# Patient Record
Sex: Male | Born: 1935 | Race: White | Hispanic: No | Marital: Married | Smoking: Never smoker
Health system: Southern US, Community
[De-identification: ages and names within clinical notes are randomized; demographics above are authoritative.]

## PROBLEM LIST (undated history)

## (undated) DIAGNOSIS — I2510 Atherosclerotic heart disease of native coronary artery without angina pectoris: Secondary | ICD-10-CM

## (undated) DIAGNOSIS — G629 Polyneuropathy, unspecified: Secondary | ICD-10-CM

## (undated) DIAGNOSIS — I639 Cerebral infarction, unspecified: Secondary | ICD-10-CM

## (undated) DIAGNOSIS — I4891 Unspecified atrial fibrillation: Secondary | ICD-10-CM

## (undated) DIAGNOSIS — I10 Essential (primary) hypertension: Secondary | ICD-10-CM

## (undated) DIAGNOSIS — K635 Polyp of colon: Secondary | ICD-10-CM

## (undated) DIAGNOSIS — K225 Diverticulum of esophagus, acquired: Secondary | ICD-10-CM

## (undated) DIAGNOSIS — H9190 Unspecified hearing loss, unspecified ear: Secondary | ICD-10-CM

## (undated) DIAGNOSIS — K922 Gastrointestinal hemorrhage, unspecified: Secondary | ICD-10-CM

## (undated) DIAGNOSIS — K625 Hemorrhage of anus and rectum: Secondary | ICD-10-CM

## (undated) DIAGNOSIS — I6529 Occlusion and stenosis of unspecified carotid artery: Secondary | ICD-10-CM

## (undated) DIAGNOSIS — K226 Gastro-esophageal laceration-hemorrhage syndrome: Secondary | ICD-10-CM

## (undated) HISTORY — DX: Polyp of colon: K63.5

## (undated) HISTORY — PX: ABDOMINAL SURGERY: SHX537

## (undated) HISTORY — PX: BACK SURGERY: SHX140

## (undated) HISTORY — DX: Gastro-esophageal laceration-hemorrhage syndrome: K22.6

## (undated) HISTORY — PX: HERNIA REPAIR: SHX51

## (undated) HISTORY — PX: APPENDECTOMY: SHX54

## (undated) HISTORY — DX: Unspecified hearing loss, unspecified ear: H91.90

## (undated) HISTORY — DX: Gastrointestinal hemorrhage, unspecified: K92.2

## (undated) HISTORY — DX: Diverticulum of esophagus, acquired: K22.5

## (undated) HISTORY — DX: Polyneuropathy, unspecified: G62.9

## (undated) HISTORY — DX: Unspecified atrial fibrillation: I48.91

## (undated) HISTORY — DX: Atherosclerotic heart disease of native coronary artery without angina pectoris: I25.10

## (undated) HISTORY — DX: Occlusion and stenosis of unspecified carotid artery: I65.29

---

## 1990-12-11 ENCOUNTER — Encounter: Payer: Self-pay | Admitting: Internal Medicine

## 1997-10-25 HISTORY — PX: CHOLECYSTECTOMY: SHX55

## 1998-01-01 ENCOUNTER — Encounter: Payer: Self-pay | Admitting: Internal Medicine

## 1999-01-07 ENCOUNTER — Encounter: Payer: Self-pay | Admitting: Family Medicine

## 1999-01-07 ENCOUNTER — Ambulatory Visit (HOSPITAL_COMMUNITY): Admission: RE | Admit: 1999-01-07 | Discharge: 1999-01-07 | Payer: Self-pay | Admitting: Family Medicine

## 1999-08-17 ENCOUNTER — Encounter: Payer: Self-pay | Admitting: Family Medicine

## 1999-08-17 ENCOUNTER — Encounter: Admission: RE | Admit: 1999-08-17 | Discharge: 1999-08-17 | Payer: Self-pay | Admitting: Family Medicine

## 1999-09-03 ENCOUNTER — Ambulatory Visit (HOSPITAL_COMMUNITY): Admission: RE | Admit: 1999-09-03 | Discharge: 1999-09-03 | Payer: Self-pay | Admitting: Neurosurgery

## 1999-09-03 ENCOUNTER — Encounter: Payer: Self-pay | Admitting: Neurosurgery

## 2000-09-01 ENCOUNTER — Ambulatory Visit (HOSPITAL_COMMUNITY): Admission: RE | Admit: 2000-09-01 | Discharge: 2000-09-01 | Payer: Self-pay | Admitting: Family Medicine

## 2000-09-01 ENCOUNTER — Encounter: Payer: Self-pay | Admitting: Family Medicine

## 2001-08-04 ENCOUNTER — Ambulatory Visit (HOSPITAL_COMMUNITY): Admission: RE | Admit: 2001-08-04 | Discharge: 2001-08-04 | Payer: Self-pay | Admitting: Family Medicine

## 2001-08-17 ENCOUNTER — Ambulatory Visit (HOSPITAL_COMMUNITY): Admission: RE | Admit: 2001-08-17 | Discharge: 2001-08-17 | Payer: Self-pay | Admitting: Family Medicine

## 2001-08-17 ENCOUNTER — Encounter: Payer: Self-pay | Admitting: Family Medicine

## 2003-03-01 ENCOUNTER — Ambulatory Visit: Admission: RE | Admit: 2003-03-01 | Discharge: 2003-03-01 | Payer: Self-pay | Admitting: Family Medicine

## 2003-10-09 ENCOUNTER — Emergency Department (HOSPITAL_COMMUNITY): Admission: EM | Admit: 2003-10-09 | Discharge: 2003-10-09 | Payer: Self-pay | Admitting: Emergency Medicine

## 2003-12-11 ENCOUNTER — Encounter: Payer: Self-pay | Admitting: Cardiovascular Disease

## 2007-03-09 ENCOUNTER — Ambulatory Visit: Payer: Self-pay | Admitting: *Deleted

## 2008-02-29 ENCOUNTER — Ambulatory Visit: Payer: Self-pay | Admitting: *Deleted

## 2008-09-12 ENCOUNTER — Ambulatory Visit: Payer: Self-pay | Admitting: *Deleted

## 2008-11-14 DIAGNOSIS — K921 Melena: Secondary | ICD-10-CM

## 2008-11-14 DIAGNOSIS — Z860100 Personal history of colon polyps, unspecified: Secondary | ICD-10-CM | POA: Insufficient documentation

## 2008-11-14 DIAGNOSIS — Z8719 Personal history of other diseases of the digestive system: Secondary | ICD-10-CM

## 2008-11-14 DIAGNOSIS — I10 Essential (primary) hypertension: Secondary | ICD-10-CM

## 2008-11-14 DIAGNOSIS — H9190 Unspecified hearing loss, unspecified ear: Secondary | ICD-10-CM | POA: Insufficient documentation

## 2008-11-14 DIAGNOSIS — G609 Hereditary and idiopathic neuropathy, unspecified: Secondary | ICD-10-CM | POA: Insufficient documentation

## 2008-11-20 ENCOUNTER — Ambulatory Visit: Payer: Self-pay | Admitting: Internal Medicine

## 2008-11-20 DIAGNOSIS — I2510 Atherosclerotic heart disease of native coronary artery without angina pectoris: Secondary | ICD-10-CM | POA: Insufficient documentation

## 2008-11-20 LAB — CONVERTED CEMR LAB
Basophils Absolute: 0 10*3/uL (ref 0.0–0.1)
Eosinophils Absolute: 0.2 10*3/uL (ref 0.0–0.7)
HCT: 39.8 % (ref 39.0–52.0)
Iron: 58 ug/dL (ref 42–165)
Neutrophils Relative %: 56.4 % (ref 43.0–77.0)
Platelets: 203 10*3/uL (ref 150–400)
RBC: 4.49 M/uL (ref 4.22–5.81)
Saturation Ratios: 20.9 % (ref 20.0–50.0)
WBC: 6.2 10*3/uL (ref 4.5–10.5)

## 2008-11-26 ENCOUNTER — Ambulatory Visit: Payer: Self-pay | Admitting: Internal Medicine

## 2009-01-15 ENCOUNTER — Encounter: Admission: RE | Admit: 2009-01-15 | Discharge: 2009-01-15 | Payer: Self-pay | Admitting: Family Medicine

## 2009-03-07 ENCOUNTER — Emergency Department (HOSPITAL_COMMUNITY): Admission: EM | Admit: 2009-03-07 | Discharge: 2009-03-07 | Payer: Self-pay | Admitting: Emergency Medicine

## 2009-03-19 ENCOUNTER — Ambulatory Visit: Payer: Self-pay | Admitting: *Deleted

## 2009-08-10 ENCOUNTER — Encounter: Payer: Self-pay | Admitting: Gastroenterology

## 2009-08-10 ENCOUNTER — Inpatient Hospital Stay (HOSPITAL_COMMUNITY): Admission: EM | Admit: 2009-08-10 | Discharge: 2009-08-14 | Payer: Self-pay | Admitting: Emergency Medicine

## 2009-08-12 ENCOUNTER — Ambulatory Visit: Payer: Self-pay | Admitting: Gastroenterology

## 2009-08-14 ENCOUNTER — Encounter (INDEPENDENT_AMBULATORY_CARE_PROVIDER_SITE_OTHER): Payer: Self-pay | Admitting: *Deleted

## 2009-08-19 ENCOUNTER — Encounter: Payer: Self-pay | Admitting: Internal Medicine

## 2009-08-22 ENCOUNTER — Encounter: Payer: Self-pay | Admitting: Gastroenterology

## 2009-08-22 ENCOUNTER — Encounter (INDEPENDENT_AMBULATORY_CARE_PROVIDER_SITE_OTHER): Payer: Self-pay | Admitting: Internal Medicine

## 2009-08-22 ENCOUNTER — Inpatient Hospital Stay (HOSPITAL_COMMUNITY): Admission: EM | Admit: 2009-08-22 | Discharge: 2009-08-24 | Payer: Self-pay | Admitting: Emergency Medicine

## 2009-08-22 ENCOUNTER — Encounter (INDEPENDENT_AMBULATORY_CARE_PROVIDER_SITE_OTHER): Payer: Self-pay | Admitting: *Deleted

## 2009-08-23 ENCOUNTER — Encounter (INDEPENDENT_AMBULATORY_CARE_PROVIDER_SITE_OTHER): Payer: Self-pay | Admitting: *Deleted

## 2009-08-25 ENCOUNTER — Telehealth: Payer: Self-pay | Admitting: Internal Medicine

## 2009-08-26 ENCOUNTER — Ambulatory Visit: Payer: Self-pay | Admitting: Internal Medicine

## 2009-08-26 DIAGNOSIS — K922 Gastrointestinal hemorrhage, unspecified: Secondary | ICD-10-CM | POA: Insufficient documentation

## 2009-08-26 LAB — CONVERTED CEMR LAB
Basophils Absolute: 0 10*3/uL (ref 0.0–0.1)
Basophils Relative: 0.8 % (ref 0.0–3.0)
Eosinophils Absolute: 0.2 10*3/uL (ref 0.0–0.7)
Eosinophils Relative: 2.8 % (ref 0.0–5.0)
HCT: 32.6 % — ABNORMAL LOW (ref 39.0–52.0)
Hemoglobin: 11.4 g/dL — ABNORMAL LOW (ref 13.0–17.0)
Lymphocytes Relative: 24.5 % (ref 12.0–46.0)
Lymphs Abs: 1.5 10*3/uL (ref 0.7–4.0)
MCHC: 35.1 g/dL (ref 30.0–36.0)
MCV: 88 fL (ref 78.0–100.0)
Monocytes Absolute: 0.8 10*3/uL (ref 0.1–1.0)
Monocytes Relative: 12.4 % — ABNORMAL HIGH (ref 3.0–12.0)
Neutro Abs: 3.6 10*3/uL (ref 1.4–7.7)
Neutrophils Relative %: 59.5 % (ref 43.0–77.0)
Platelets: 209 10*3/uL (ref 150.0–400.0)
RBC: 3.7 M/uL — ABNORMAL LOW (ref 4.22–5.81)
RDW: 14 % (ref 11.5–14.6)
WBC: 6.1 10*3/uL (ref 4.5–10.5)

## 2009-08-29 ENCOUNTER — Ambulatory Visit: Payer: Self-pay | Admitting: Vascular Surgery

## 2009-10-25 DIAGNOSIS — K226 Gastro-esophageal laceration-hemorrhage syndrome: Secondary | ICD-10-CM

## 2009-10-25 HISTORY — DX: Gastro-esophageal laceration-hemorrhage syndrome: K22.6

## 2010-05-07 ENCOUNTER — Ambulatory Visit: Payer: Self-pay | Admitting: Vascular Surgery

## 2010-08-13 ENCOUNTER — Encounter: Admission: RE | Admit: 2010-08-13 | Discharge: 2010-08-13 | Payer: Self-pay | Admitting: General Surgery

## 2010-08-14 ENCOUNTER — Encounter (INDEPENDENT_AMBULATORY_CARE_PROVIDER_SITE_OTHER): Payer: Self-pay | Admitting: *Deleted

## 2010-08-14 ENCOUNTER — Ambulatory Visit (HOSPITAL_BASED_OUTPATIENT_CLINIC_OR_DEPARTMENT_OTHER): Admission: RE | Admit: 2010-08-14 | Discharge: 2010-08-14 | Payer: Self-pay | Admitting: General Surgery

## 2010-08-31 IMAGING — CR DG ABDOMEN ACUTE W/ 1V CHEST
3 series · 3 of 3 positions shown · non-contrast
Comparison: 08/21/2009

CLINICAL DATA: Orthostatic hypotension.  Hypertension.  Recent GI
bleed.

ACUTE ABDOMEN SERIES (ABDOMEN 2 VIEW & CHEST 1 VIEW)

[view not recorded (1 of 3)]
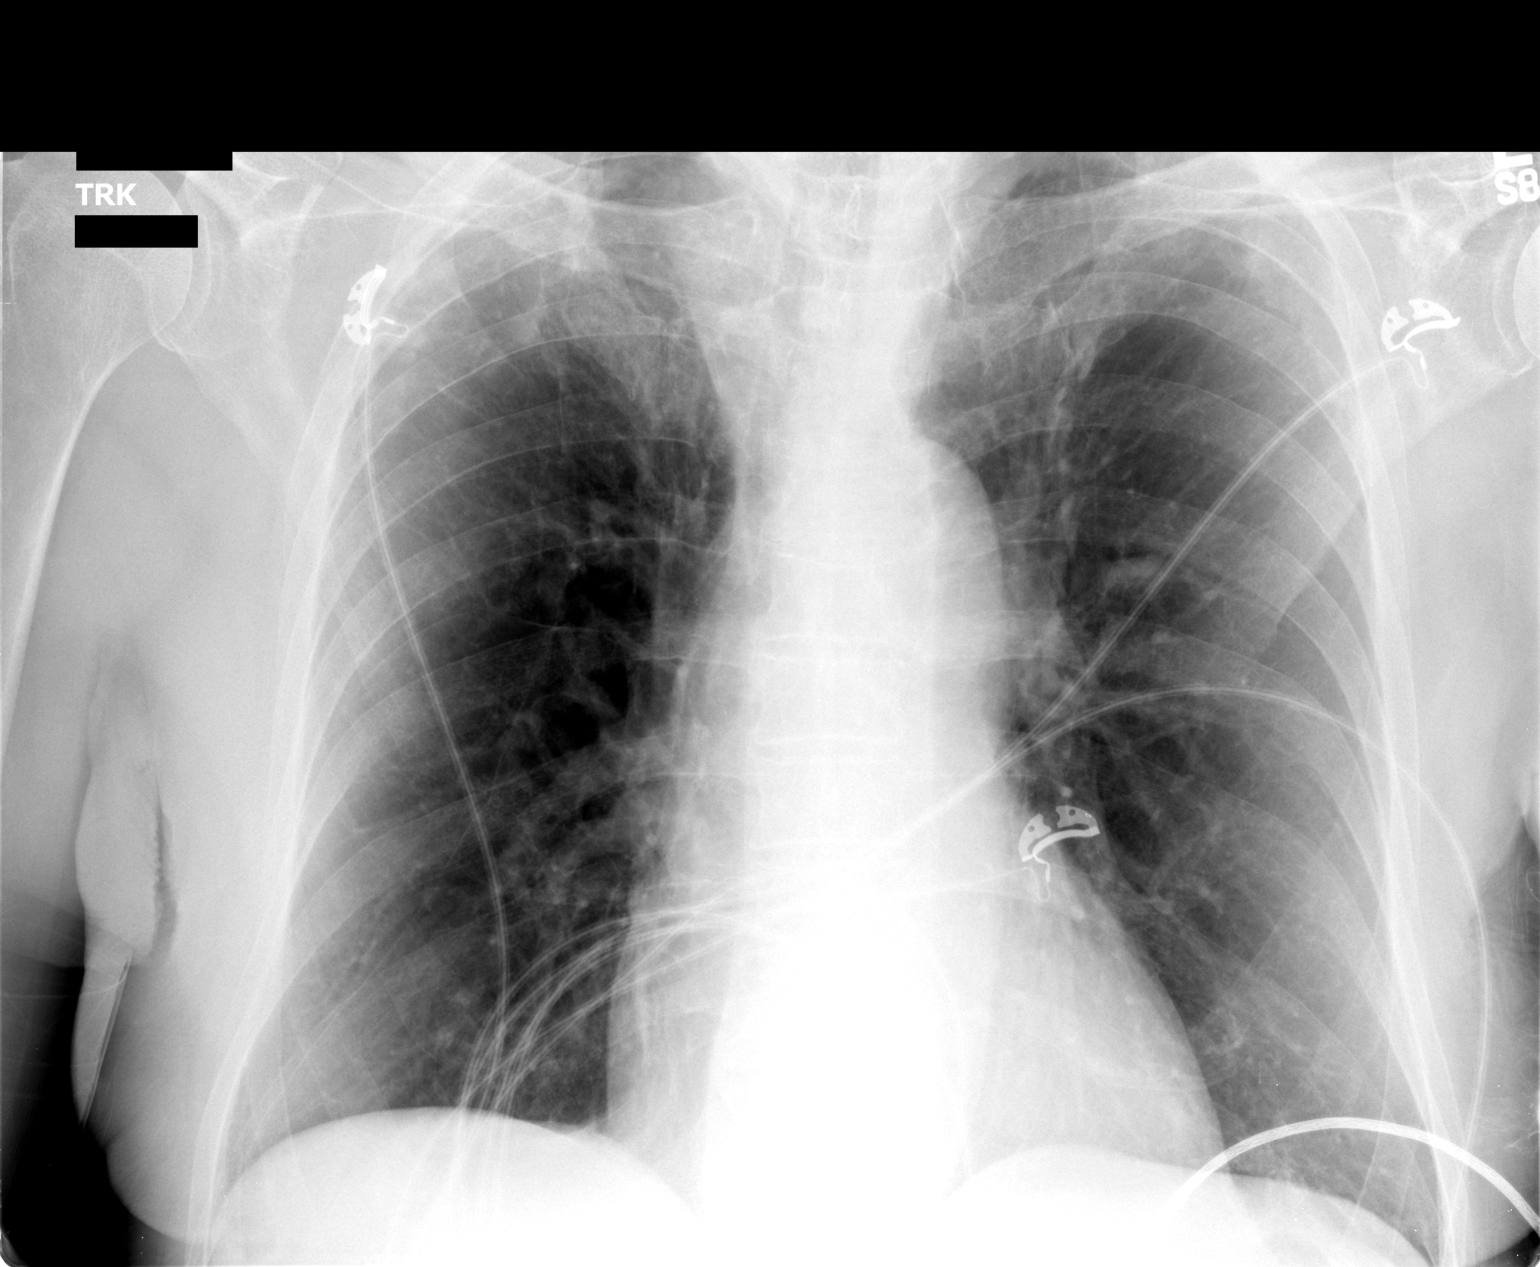

[view not recorded (2 of 3)]
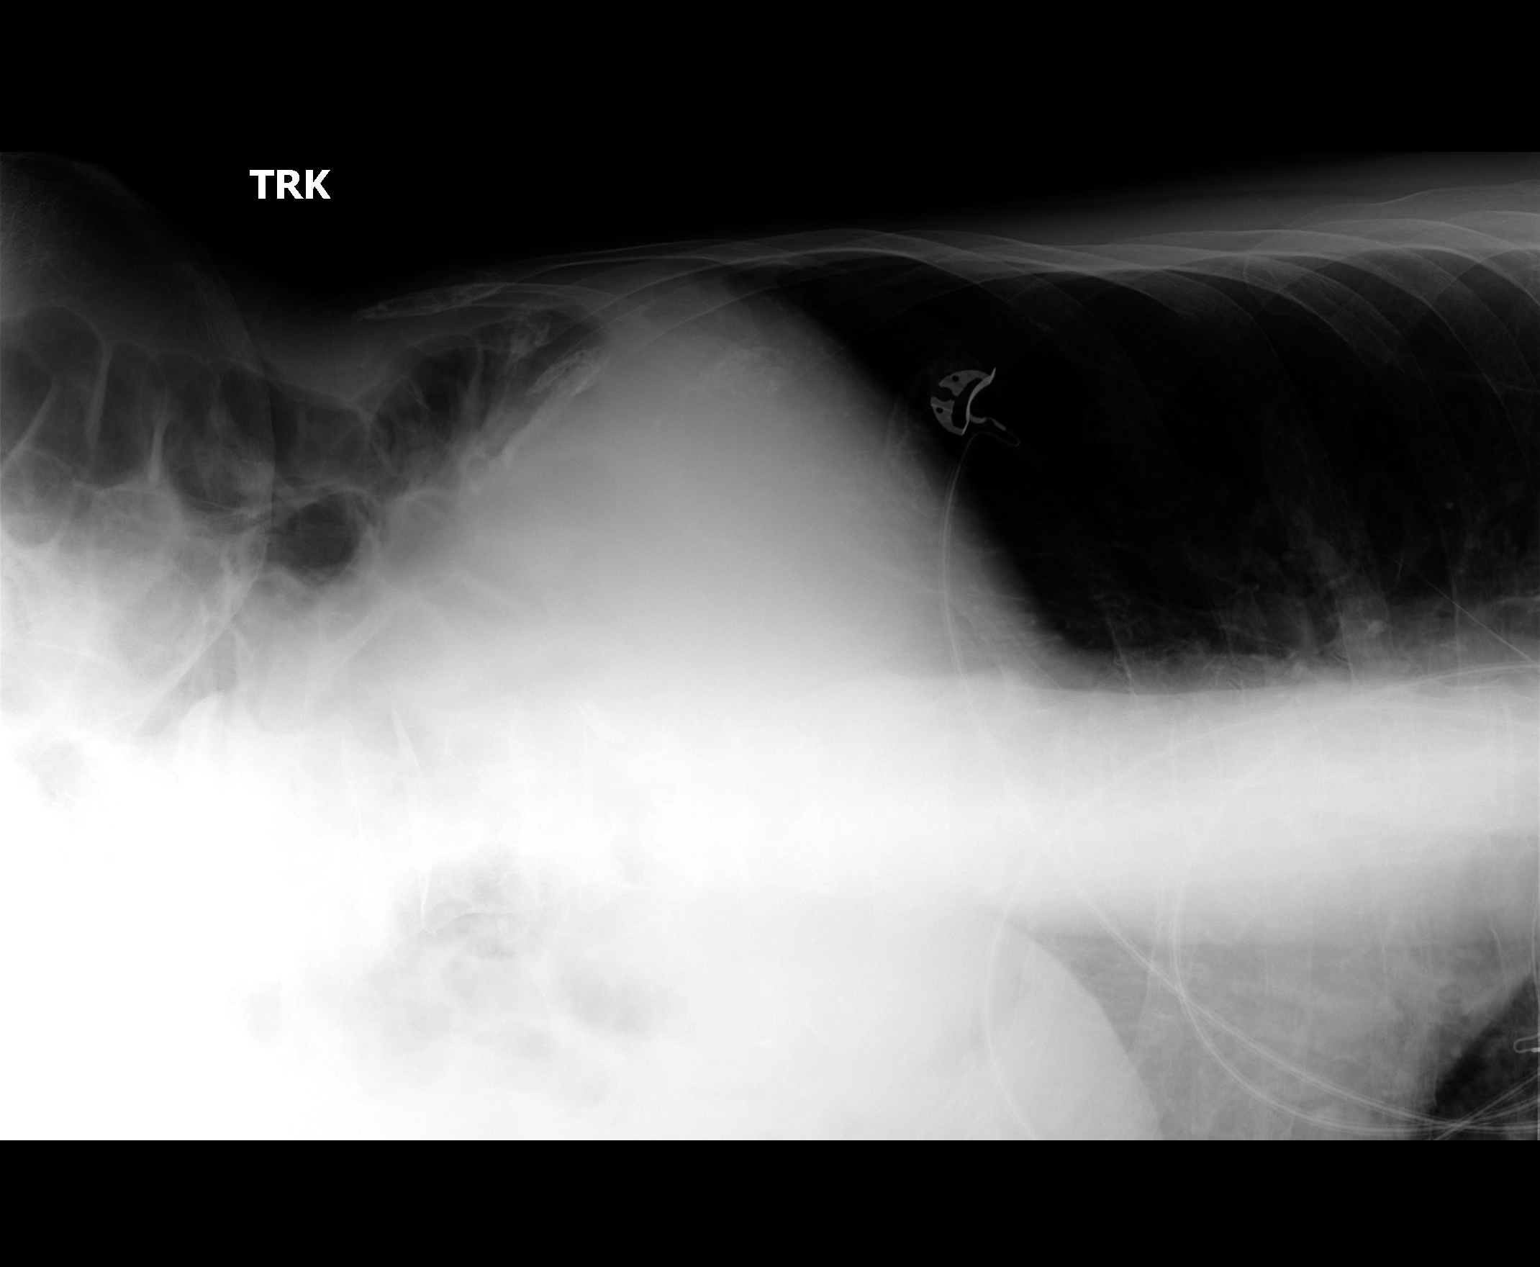

[view not recorded (3 of 3)]
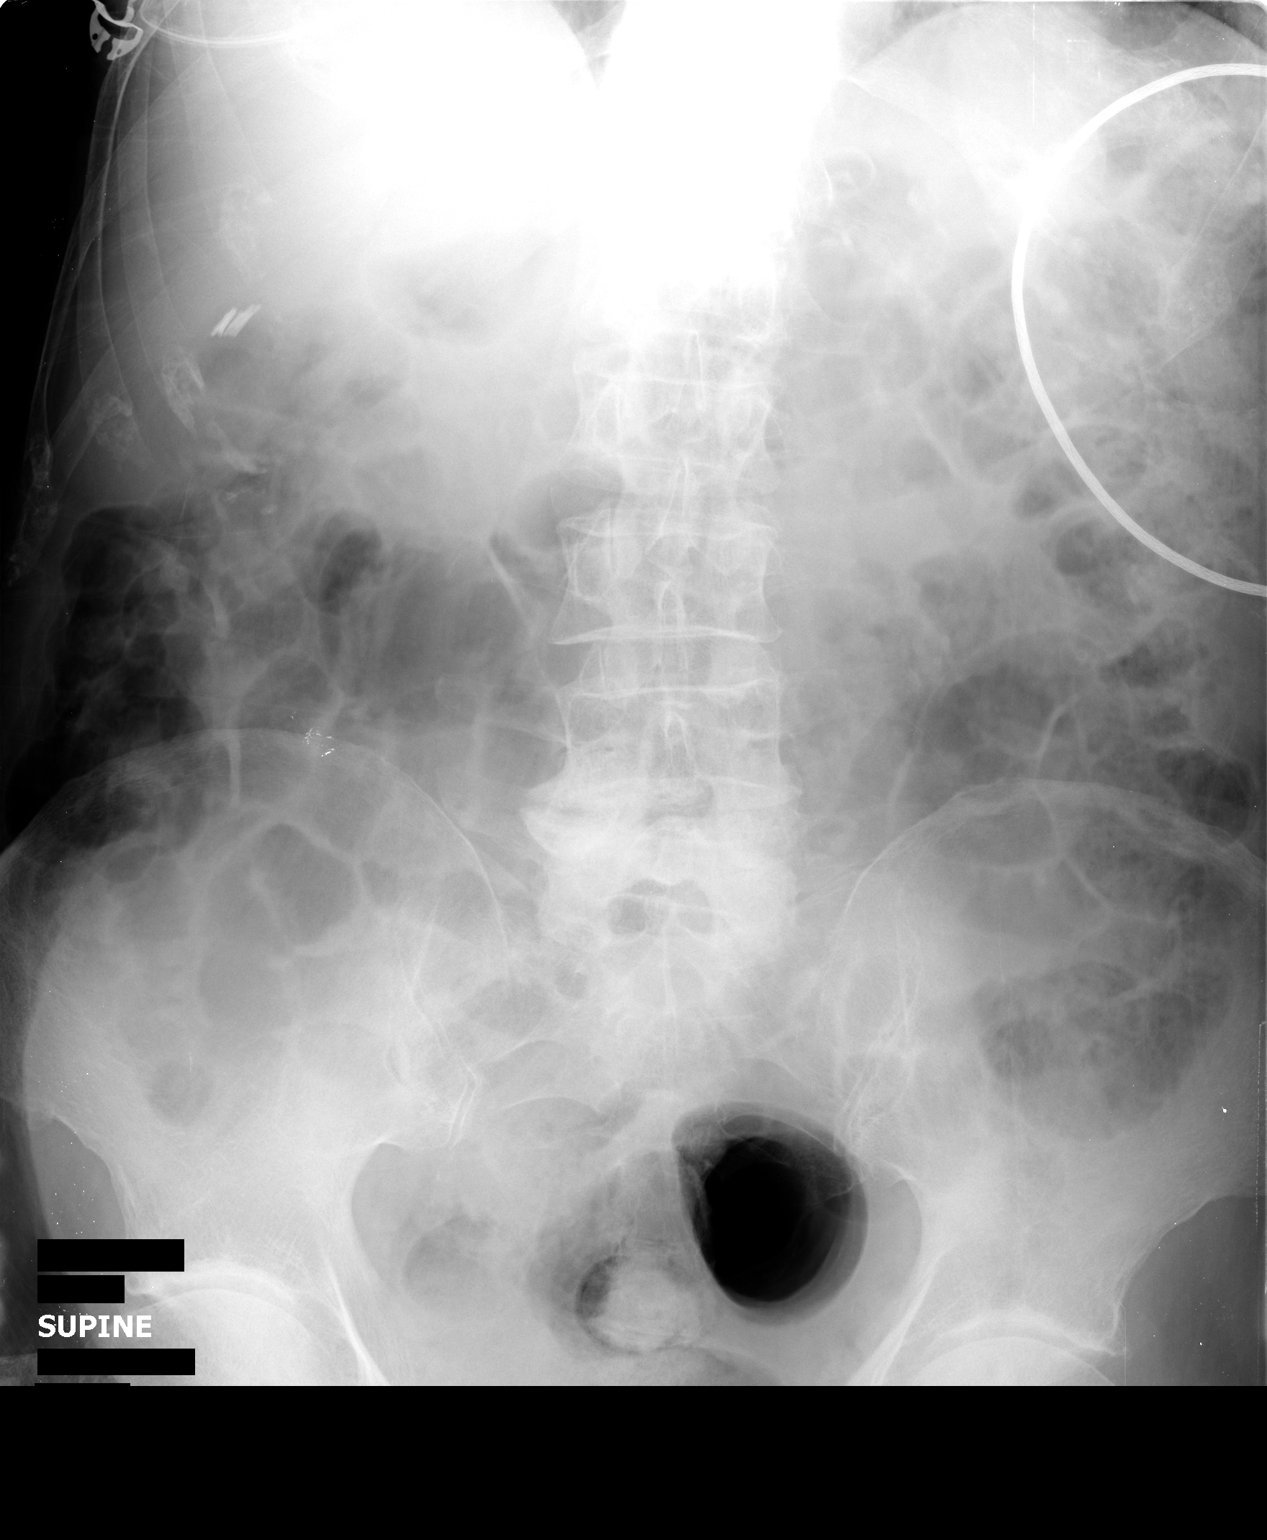

[3 of 3 positions shown; findings below may reference images not displayed]

FINDINGS: COPD.  Chronic markings at the apices. Nonspecific
generalized bowel distention compatible with an ileus.  No
extraluminal gas.
IMPRESSION: COPD.  No acute chest findings.  Ileus.

## 2010-09-01 ENCOUNTER — Encounter: Payer: Self-pay | Admitting: Internal Medicine

## 2010-11-04 ENCOUNTER — Ambulatory Visit: Admit: 2010-11-04 | Payer: Self-pay | Admitting: Vascular Surgery

## 2010-11-27 ENCOUNTER — Encounter: Payer: Self-pay | Admitting: Internal Medicine

## 2010-12-09 ENCOUNTER — Ambulatory Visit (INDEPENDENT_AMBULATORY_CARE_PROVIDER_SITE_OTHER): Payer: Medicare Other | Admitting: Internal Medicine

## 2010-12-09 ENCOUNTER — Other Ambulatory Visit (INDEPENDENT_AMBULATORY_CARE_PROVIDER_SITE_OTHER): Payer: Medicare Other

## 2010-12-09 ENCOUNTER — Encounter: Payer: Self-pay | Admitting: Internal Medicine

## 2010-12-09 ENCOUNTER — Ambulatory Visit (INDEPENDENT_AMBULATORY_CARE_PROVIDER_SITE_OTHER): Payer: Medicare Other | Admitting: Vascular Surgery

## 2010-12-09 ENCOUNTER — Other Ambulatory Visit: Payer: Self-pay | Admitting: Internal Medicine

## 2010-12-09 DIAGNOSIS — I6529 Occlusion and stenosis of unspecified carotid artery: Secondary | ICD-10-CM

## 2010-12-09 DIAGNOSIS — R1319 Other dysphagia: Secondary | ICD-10-CM

## 2010-12-09 DIAGNOSIS — K225 Diverticulum of esophagus, acquired: Secondary | ICD-10-CM

## 2010-12-10 NOTE — Assessment & Plan Note (Signed)
OFFICE VISIT  RONDEY, FALLEN DOB:  07/18/1922                                       12/09/2010 ZOXWR#:[id number]  I saw the patient in the office today for continued follow-up of his carotid disease.  He is a former patient Dr. Madilyn Fireman.  He is a pleasant 77- year-old gentleman with bilateral carotid disease.  Since I saw him last in July 2011, he has had no history of stroke, TIAs, expressive or receptive aphasia, or amaurosis fugax.  SOCIAL HISTORY:  He is married.  He does not smoke cigarettes.  REVIEW OF SYSTEMS:  CARDIOVASCULAR:  He had no chest pain, chest pressure, palpitations or arrhythmias. PULMONARY:  He had no productive cough, bronchitis, asthma or wheezing.  PHYSICAL EXAMINATION:  This is a pleasant 77 year old gentleman who appears his stated age.  Blood is pressure 155/77, heart rate is 65, saturation 97%.  Lungs are clear bilaterally to auscultation without rales, rhonchi or wheezing.  On cardiovascular exam I do not detect any carotid bruits.  He has a regular rate and rhythm.  Feet are warm and well-perfused.  He has no significant lower extremity swelling.  Abdomen is soft and nontender with normal-pitched bowel sounds.  Neurologic exam:  He has no focal weakness or paresthesias.  I have independently interpreted his carotid duplex scan, which shows a 40%-59% right carotid stenosis in the higher end of that range.  On the left side he has a less than 39% stenosis and, again, at the higher end of that range.  In comparing the study to his previous study in July 2011, the velocities have decreased slightly, which is why his category changed compared to his previous study.  Regardless, he understands we would typically not consider carotid endarterectomy unless the stenosis progressed to greater than 80% or he developed new neurologic symptoms. I have ordered a follow-up carotid duplex scan in 1 year.  I will plan on seeing him back in  2 years unless there has been any significant change in his carotid duplex study at next visit.  He knows to call sooner if he has problems.    Di Kindle. Edilia Bo, M.D. Electronically Signed  CSD/MEDQ  D:  12/09/2010  T:  12/10/2010  Job:  [id number]

## 2010-12-11 ENCOUNTER — Ambulatory Visit (HOSPITAL_COMMUNITY)
Admission: RE | Admit: 2010-12-11 | Discharge: 2010-12-11 | Disposition: A | Discharge status: Home / Self Care | Payer: Medicare Other | Source: Ambulatory Visit | Attending: Internal Medicine | Admitting: Internal Medicine

## 2010-12-11 DIAGNOSIS — K225 Diverticulum of esophagus, acquired: Secondary | ICD-10-CM | POA: Insufficient documentation

## 2010-12-16 NOTE — Letter (Signed)
Summary: [facility]  [facility]   Imported By: Lamona Curl CMA (AAMA) 12/08/2010 11:38:27  _____________________________________________________________________  External Attachment:    Type:   Image     Comment:   External Document

## 2010-12-16 NOTE — Op Note (Signed)
Summary: Left Inguinal Hernia     NAME:  Manuel [name], Manuel [name]              ACCOUNT NO.:  [id number]      MEDICAL RECORD NO.:  [id number]          PATIENT TYPE:  AMB      LOCATION:  DSC                          FACILITY:  [facility]      PHYSICIAN:  Cherylynn Ridges, M.D.    DATE OF BIRTH:  [birth year]      DATE OF PROCEDURE:  08/14/2010   DATE OF DISCHARGE:                                  OPERATIVE REPORT         PREOPERATIVE DIAGNOSIS:  Left inguinal hernia.      POSTOPERATIVE DIAGNOSIS:  Primary indirect with small direct left   inguinal hernia.      PROCEDURE:  Left inguinal hernia repair with mesh.      SURGEON:  Marta Lamas. Lindie Spruce, MD      ANESTHESIA:  General with laryngeal airway.      ESTIMATED BLOOD LOSS:  Less than 20 mL.      COMPLICATIONS:  None.      CONDITION:  Stable.      FINDINGS:  Primarily an indirect and a small direct hernia.      OPERATION:  The patient was taken to the operating room, placed on table   in supine position.  After an adequate time-out was performed   identifying the patient and the procedure being done, he was prepped and   draped in usual sterile manner exposing the left inguinal area.      A left transverse curvilinear incision was made at the level of the   superficial ring and taken down into the subcutaneous tissue.  We then   dissected down to the external oblique fascia which was then split along   its fibers down through the superficial open ring.  We dissected out the   spermatic cord and isolated with Penrose drain.  We then isolated up on   a workbench on top of the Penrose drain and dissected out the hernia sac   in the anteromedial aspect of the spermatic cord.      We isolated the sac away from the cord and then suture ligated it at its   base using 2 suture ligatures of 0 Ethibond suture.  We resected the   excess sac and subsequently there was a bulge from the direct component   of the inguinal canal.  We controlled this using an  oval piece of   polypropylene mesh measuring approximately 4 x 6 cm in size, attaching   it to the conjoined tendon and the reflected portion of inguinal   ligament using running 0 Prolene suture.  The mesh had been soaked in   antibiotic solution prior to being implanted and we irrigated with the   same solution.  Once the mesh was in place, we placed the cord back in   the canal and then reapproximated the external oblique fascia on top of   the cord using running 3-0 Vicryl suture taking care not to entrap the   ilioinguinal nerve.  Once this was done, [employer]  fascia was   reapproximated using interrupted simple stitches of 3-0 Vicryl.  We   injected 0.5% Marcaine without epi at the anterior-superior iliac spine   as a block and also in the subcutaneous tissue of the   incision.  We closed the skin using running subcuticular stitch of 4-0   Monocryl.  Dermabond, Steri-Strips, and Tegaderm were used to complete   the dressing.  All needle counts, sponge counts, and instrument counts   were correct.               Cherylynn Ridges, M.D.               JOW/MEDQ  D:  08/14/2010  T:  08/15/2010  Job:  [id number]      cc:   Holley Bouche, M.D.      Electronically Signed by Jimmye Norman M.D. on 09/02/2010 10:51:11 AM

## 2010-12-16 NOTE — Assessment & Plan Note (Signed)
Summary: follow up    History of Present Illness Visit Type: Follow-up Visit Primary GI MD: Lina Sar MD Primary Provider: Holley Bouche, MD  Requesting Provider: n/a Chief Complaint: Pt c/o dysphagia  History of Present Illness:   This is an 77 year old white male with gradual onset dysphagia localized to his  upper throat associated with mostly solid food. The food and pills get caught in his throat and he has to cough it up. This does not occur every day but does happen often. He thinks the food accumulates on the right side of his neck. He was found to have a small Zenker's diverticulum on an upper GI series in October 2003 when  he was hospitalized wit  an upper GI bleed . 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 He has done well from that perspective taking Nexium 40 mg daily. A colonoscopy in February 2010 showed diverticulosis. A small bowel follow-through in 1992 was normal. Additional medical problems include right carotid artery stenosis, and peripheral neuropathy.   GI Review of Systems    Reports dysphagia with solids.      Denies abdominal pain, acid reflux, belching, bloating, chest pain, dysphagia with liquids, heartburn, loss of appetite, nausea, vomiting, vomiting blood, weight loss, and  weight gain.        Denies anal fissure, black tarry stools, change in bowel habit, constipation, diarrhea, diverticulosis, fecal incontinence, heme positive stool, hemorrhoids, irritable bowel syndrome, jaundice, light color stool, liver problems, rectal bleeding, and  rectal pain.    Current Medications (verified): 1)  Vitamin C 500 Mg  Tabs (Ascorbic Acid) .... One Tablet By Mouth Once Daily 2)  Multivitamins   Tabs (Multiple Vitamin) .... One Tablet By Mouth Once Daily 3)  Omega-3 350 Mg Caps (Omega-3 Fatty Acids) .... One Tablet By Mouth Once Daily 4)  Benadryl 25 Mg Caps (Diphenhydramine  Hcl) .... One Capsule By Mouth Once Daily As Needed 5)  Ensure  Liqd (Nutritional Supplements) .... 3 Daily 6)  Nexium 40 Mg Cpdr (Esomeprazole Magnesium) .... Two Capsules By Mouth Once Daily 7)  Tylenol Arthritis Pain 650 Mg Cr-Tabs (Acetaminophen) .... One Tablet By Mouth in The Morning and Two Tablets By Mouth At Bedtime  Allergies (verified): 1)  ! Vioxx 2)  ! Codeine 3)  ! Sulfa  Past History:  Past Medical History: GASTROINTESTINAL HEMORRHAGE (ICD-578.9) CAD (ICD-414.00) PERIPHERAL NEUROPATHY (ICD-356.9) GASTROENTERITIS, HX OF (ICD-V12.79) COLONIC POLYPS, HX OF (ICD-V12.72) HEMOCCULT POSITIVE STOOL (ICD-578.1) HEARING LOSS (ICD-389.9) HYPERTENSION (ICD-401.9)      Past Surgical History: appendectomy cholecystectomy microdiscectomy-lumbar spine small bowel obstructio-surgically repaired tonsillectomy cataract extraction-bilateral left inguinal hernia repair with mesh  Family History: Reviewed history from 08/26/2009 and no changes required. Family History of Prostate Cancer: Brother Family History of Heart Disease: Mother Family History of Diabetes: Brother Family History of Liver Cancer: Father Family History of Colon Cancer: Older Brother   Social History: Reviewed history from 11/20/2008 and no changes required. Married Retired Alcohol Use - no Illicit Drug Use - no Patient has never smoked.  Daily Caffeine Use  Review of Systems       The patient complains of arthritis/joint pain.  The patient denies allergy/sinus, anemia, anxiety-new, back pain, blood in urine, breast changes/lumps, change in vision, confusion, cough, coughing up blood, depression-new, fainting, fatigue, fever, headaches-new, hearing problems, heart murmur, heart rhythm changes, itching, menstrual pain, muscle pains/cramps, night sweats, nosebleeds, pregnancy symptoms, shortness of  breath, skin rash, sleeping problems, sore throat, swelling of feet/legs, swollen lymph glands, thirst -  excessive , urination - excessive , urination changes/pain, urine leakage, vision changes, and voice change.         Pertinent positive and negative review of systems were noted in the above HPI. All other ROS was otherwise negative.   Vital Signs:  Patient profile:   77 year old male Height:      73 inches Weight:      194 pounds BMI:     25.69 BSA:     2.13 Pulse rate:   56 / minute Pulse rhythm:   regular BP sitting:   132 / 80  (left arm) Cuff size:   regular  Vitals Entered By: Ok Anis CMA (December 09, 2010 9:55 AM)  Physical Exam  General:  alert and oriented with normal voice. No cough. Mouth:  no oral mucosa. Neck:  supple neck, normal thyroid. Lungs:  Clear throughout to auscultation. Heart:  Regular rate and rhythm; no murmurs, rubs,  or bruits. Abdomen:  Soft, nontender and nondistended. No masses, hepatosplenomegaly or hernias noted. Normal bowel sounds. Skin:  Intact without significant lesions or rashes. Psych:  Alert and cooperative. Normal mood and affect.   Impression & Recommendations:  Problem # 1:  ZENKER'S DIVERTICULUM (ICD-530.6) Patient has symptomatic Zenker's diverticulum. He has dysphagia to pills and solids. Upper GI series in October 2010 showed  small  Zenker's diverticulum. We are going to repeat the barium swallow to see the size and feasibility of surgical diverticulectomy.. I have discussed the possibility of Zenker's diverticulectomy by an ear, nose and throat specialist with the pt.  Orders: Barium Swallow (Barium Swallow)  Problem # 2:  GASTROINTESTINAL HEMORRHAGE (ICD-578.9) Patient is status post GI bleed from AVMs. This is not an active  problem.  Patient Instructions: 1)  You have been scheduled for a barium swallow for followup of Zenker's diverticulum. 2)  Continue Nexium 40 mg daily. 3)  Further disposition will depend on results of the upper GI. 4)  Copy sent to : Dr R.Harris 5)  The medication list was reviewed and  reconciled.  All changed / newly prescribed medications were explained.  A complete medication list was provided to the patient / caregiver.

## 2010-12-23 ENCOUNTER — Encounter: Payer: Self-pay | Admitting: Internal Medicine

## 2010-12-24 NOTE — Procedures (Unsigned)
CAROTID DUPLEX EXAM  INDICATION:  Follow up known carotid artery disease.  HISTORY: Diabetes:  No. Cardiac:  Arrhythmia. Hypertension:  Yes. Smoking:  No. Previous Surgery:  No. CV History:  Asymptomatic. Amaurosis Fugax No, Paresthesias No, Hemiparesis No.                                      RIGHT             LEFT Brachial systolic pressure:         122               125 Brachial Doppler waveforms:         Normal            Normal Vertebral direction of flow:        Antegrade         Antegrade DUPLEX VELOCITIES (cm/sec) CCA peak systolic                   91                77 ECA peak systolic                   180               128 ICA peak systolic                   203               164 ICA end diastolic                   51                27 PLAQUE MORPHOLOGY:                  Mixed             Mixed PLAQUE AMOUNT:                      Moderate          Moderate PLAQUE LOCATION:                    ICA, ECA          ICA, ECA, CCA  IMPRESSION: 1. Right internal carotid artery velocities suggest 40% to 59%     stenosis, high end of range. 2. Left internal carotid artery velocities suggest 1% to 39% stenosis,     high end of range. 3. Velocities remain stable from the previous study.  ___________________________________________ Di Kindle. Edilia Bo, M.D.  EM/MEDQ  D:  12/09/2010  T:  12/09/2010  Job:  [id number]

## 2011-01-05 NOTE — Consult Note (Signed)
Summary: Suzanna Obey MD/[facility]  Suzanna Obey MD/[facility]   Imported By: Lester Montrose 12/31/2010 10:24:30  _____________________________________________________________________  External Attachment:    Type:   Image     Comment:   External Document

## 2011-01-06 LAB — BASIC METABOLIC PANEL
CO2: 25 mEq/L (ref 19–32)
Calcium: 9 mg/dL (ref 8.4–10.5)
Chloride: 108 mEq/L (ref 96–112)
Creatinine, Ser: 1.12 mg/dL (ref 0.4–1.5)
GFR calc non Af Amer: >60 mL/min (ref >60)
Glucose, Bld: 105 mg/dL — ABNORMAL HIGH (ref 70–99)
Potassium: 4.3 mEq/L (ref 3.5–5.1)

## 2011-01-06 LAB — CBC
MCH: 28.4 pg (ref 26.0–34.0)
MCHC: 32.9 g/dL (ref 30.0–36.0)
Platelets: 181 10*3/uL (ref 150–400)
RDW: 13.9 % (ref 11.5–15.5)

## 2011-01-06 LAB — DIFFERENTIAL
Eosinophils Absolute: 0.3 10*3/uL (ref 0.0–0.7)
Eosinophils Relative: 4 % (ref 0–5)
Lymphocytes Relative: 24 % (ref 12–46)
Monocytes Relative: 12 % (ref 3–12)
Neutro Abs: 3.8 10*3/uL (ref 1.7–7.7)
Neutrophils Relative %: 59 % (ref 43–77)

## 2011-01-08 ENCOUNTER — Encounter (HOSPITAL_COMMUNITY)
Admission: RE | Admit: 2011-01-08 | Discharge: 2011-01-08 | Disposition: A | Discharge status: Home / Self Care | Payer: Medicare Other | Source: Ambulatory Visit | Attending: Otolaryngology | Admitting: Otolaryngology

## 2011-01-08 DIAGNOSIS — Z01812 Encounter for preprocedural laboratory examination: Secondary | ICD-10-CM | POA: Insufficient documentation

## 2011-01-08 DIAGNOSIS — Z01810 Encounter for preprocedural cardiovascular examination: Secondary | ICD-10-CM | POA: Insufficient documentation

## 2011-01-08 LAB — CBC
MCHC: 33.4 g/dL (ref 30.0–36.0)
WBC: 6 10*3/uL (ref 4.0–10.5)

## 2011-01-08 LAB — BASIC METABOLIC PANEL
CO2: 29 mEq/L (ref 19–32)
Calcium: 9.2 mg/dL (ref 8.4–10.5)
GFR calc Af Amer: >60 mL/min (ref >60)
Glucose, Bld: 108 mg/dL — ABNORMAL HIGH (ref 70–99)

## 2011-01-08 LAB — SURGICAL PCR SCREEN: MRSA, PCR: NEGATIVE

## 2011-01-21 ENCOUNTER — Ambulatory Visit (HOSPITAL_COMMUNITY)
Admission: RE | Admit: 2011-01-21 | Discharge: 2011-01-21 | Disposition: A | Discharge status: Home / Self Care | Payer: Medicare Other | Source: Ambulatory Visit | Attending: Otolaryngology | Admitting: Otolaryngology

## 2011-01-21 DIAGNOSIS — Z01810 Encounter for preprocedural cardiovascular examination: Secondary | ICD-10-CM | POA: Insufficient documentation

## 2011-01-21 DIAGNOSIS — Z01812 Encounter for preprocedural laboratory examination: Secondary | ICD-10-CM | POA: Insufficient documentation

## 2011-01-21 DIAGNOSIS — I4891 Unspecified atrial fibrillation: Secondary | ICD-10-CM | POA: Insufficient documentation

## 2011-01-21 DIAGNOSIS — K225 Diverticulum of esophagus, acquired: Secondary | ICD-10-CM | POA: Insufficient documentation

## 2011-01-21 DIAGNOSIS — I10 Essential (primary) hypertension: Secondary | ICD-10-CM | POA: Insufficient documentation

## 2011-01-21 NOTE — Letter (Signed)
Summary: [facility]   Imported By: Lennie Odor 01/11/2011 12:06:15  _____________________________________________________________________  External Attachment:    Type:   Image     Comment:   External Document

## 2011-01-28 LAB — CROSSMATCH
ABO/RH(D): A POS
ABO/RH(D): A POS
Antibody Screen: NEGATIVE
Antibody Screen: NEGATIVE

## 2011-01-28 LAB — DIFFERENTIAL
Basophils Absolute: 0 10*3/uL (ref 0.0–0.1)
Basophils Absolute: 0 10*3/uL (ref 0.0–0.1)
Basophils Relative: 0 % (ref 0–1)
Basophils Relative: 1 % (ref 0–1)
Eosinophils Absolute: 0.1 10*3/uL (ref 0.0–0.7)
Eosinophils Absolute: 0.1 10*3/uL (ref 0.0–0.7)
Eosinophils Relative: 1 % (ref 0–5)
Monocytes Absolute: 0.6 10*3/uL (ref 0.1–1.0)
Monocytes Relative: 6 % (ref 3–12)
Monocytes Relative: 8 % (ref 3–12)
Neutro Abs: 5.2 10*3/uL (ref 1.7–7.7)
Neutro Abs: 6.9 10*3/uL (ref 1.7–7.7)
Neutrophils Relative %: 69 % (ref 43–77)
Neutrophils Relative %: 81 % — ABNORMAL HIGH (ref 43–77)

## 2011-01-28 LAB — URINALYSIS, ROUTINE W REFLEX MICROSCOPIC
Bilirubin Urine: NEGATIVE
Nitrite: NEGATIVE
Protein, ur: NEGATIVE mg/dL

## 2011-01-28 LAB — GLUCOSE, CAPILLARY
Glucose-Capillary: 102 mg/dL — ABNORMAL HIGH (ref 70–99)
Glucose-Capillary: 136 mg/dL — ABNORMAL HIGH (ref 70–99)

## 2011-01-28 LAB — POCT CARDIAC MARKERS
CKMB, poc: 1 ng/mL (ref 1.0–8.0)
Troponin i, poc: <0.05 ng/mL (ref 0.00–0.09)

## 2011-01-28 LAB — CBC
HCT: 23.9 % — ABNORMAL LOW (ref 39.0–52.0)
HCT: 24 % — ABNORMAL LOW (ref 39.0–52.0)
HCT: 25 % — ABNORMAL LOW (ref 39.0–52.0)
HCT: 25.2 % — ABNORMAL LOW (ref 39.0–52.0)
HCT: 27.2 % — ABNORMAL LOW (ref 39.0–52.0)
HCT: 27.5 % — ABNORMAL LOW (ref 39.0–52.0)
HCT: 28.1 % — ABNORMAL LOW (ref 39.0–52.0)
HCT: 32.1 % — ABNORMAL LOW (ref 39.0–52.0)
Hemoglobin: 10.1 g/dL — ABNORMAL LOW (ref 13.0–17.0)
Hemoglobin: 10.4 g/dL — ABNORMAL LOW (ref 13.0–17.0)
Hemoglobin: 7.6 g/dL — CL (ref 13.0–17.0)
Hemoglobin: 8.2 g/dL — ABNORMAL LOW (ref 13.0–17.0)
Hemoglobin: 8.3 g/dL — ABNORMAL LOW (ref 13.0–17.0)
Hemoglobin: 9.3 g/dL — ABNORMAL LOW (ref 13.0–17.0)
Hemoglobin: 9.5 g/dL — ABNORMAL LOW (ref 13.0–17.0)
Hemoglobin: 9.8 g/dL — ABNORMAL LOW (ref 13.0–17.0)
MCHC: 33.6 g/dL (ref 30.0–36.0)
MCHC: 34.1 g/dL (ref 30.0–36.0)
MCHC: 34.1 g/dL (ref 30.0–36.0)
MCHC: 34.3 g/dL (ref 30.0–36.0)
MCHC: 34.4 g/dL (ref 30.0–36.0)
MCHC: 34.4 g/dL (ref 30.0–36.0)
MCHC: 34.5 g/dL (ref 30.0–36.0)
MCHC: 34.7 g/dL (ref 30.0–36.0)
MCHC: 34.8 g/dL (ref 30.0–36.0)
MCHC: 35 g/dL (ref 30.0–36.0)
MCV: 87 fL (ref 78.0–100.0)
MCV: 87.3 fL (ref 78.0–100.0)
MCV: 87.8 fL (ref 78.0–100.0)
MCV: 88.3 fL (ref 78.0–100.0)
MCV: 88.4 fL (ref 78.0–100.0)
MCV: 88.5 fL (ref 78.0–100.0)
MCV: 89.1 fL (ref 78.0–100.0)
MCV: 89.4 fL (ref 78.0–100.0)
Platelets: 127 10*3/uL — ABNORMAL LOW (ref 150–400)
Platelets: 128 10*3/uL — ABNORMAL LOW (ref 150–400)
Platelets: 134 10*3/uL — ABNORMAL LOW (ref 150–400)
Platelets: 136 10*3/uL — ABNORMAL LOW (ref 150–400)
Platelets: 164 10*3/uL (ref 150–400)
Platelets: 185 10*3/uL (ref 150–400)
Platelets: 192 10*3/uL (ref 150–400)
Platelets: 205 10*3/uL (ref 150–400)
Platelets: 213 10*3/uL (ref 150–400)
Platelets: 236 10*3/uL (ref 150–400)
RBC: 2.69 MIL/uL — ABNORMAL LOW (ref 4.22–5.81)
RBC: 2.74 MIL/uL — ABNORMAL LOW (ref 4.22–5.81)
RBC: 2.75 MIL/uL — ABNORMAL LOW (ref 4.22–5.81)
RBC: 2.92 MIL/uL — ABNORMAL LOW (ref 4.22–5.81)
RBC: 3.13 MIL/uL — ABNORMAL LOW (ref 4.22–5.81)
RBC: 3.15 MIL/uL — ABNORMAL LOW (ref 4.22–5.81)
RBC: 3.17 MIL/uL — ABNORMAL LOW (ref 4.22–5.81)
RBC: 3.6 MIL/uL — ABNORMAL LOW (ref 4.22–5.81)
RDW: 13.5 % (ref 11.5–15.5)
RDW: 13.6 % (ref 11.5–15.5)
RDW: 13.7 % (ref 11.5–15.5)
RDW: 13.8 % (ref 11.5–15.5)
RDW: 13.8 % (ref 11.5–15.5)
RDW: 14.5 % (ref 11.5–15.5)
RDW: 14.9 % (ref 11.5–15.5)
RDW: 15 % (ref 11.5–15.5)
RDW: 15.2 % (ref 11.5–15.5)
RDW: 15.3 % (ref 11.5–15.5)
WBC: 3.9 10*3/uL — ABNORMAL LOW (ref 4.0–10.5)
WBC: 4.4 10*3/uL (ref 4.0–10.5)
WBC: 5.4 10*3/uL (ref 4.0–10.5)
WBC: 5.5 10*3/uL (ref 4.0–10.5)
WBC: 5.5 10*3/uL (ref 4.0–10.5)

## 2011-01-28 LAB — BASIC METABOLIC PANEL
BUN: 24 mg/dL — ABNORMAL HIGH (ref 6–23)
BUN: 39 mg/dL — ABNORMAL HIGH (ref 6–23)
CO2: 25 mEq/L (ref 19–32)
Calcium: 8.7 mg/dL (ref 8.4–10.5)
Chloride: 110 mEq/L (ref 96–112)
Creatinine, Ser: 1.05 mg/dL (ref 0.4–1.5)
Creatinine, Ser: 1.15 mg/dL (ref 0.4–1.5)
GFR calc Af Amer: >60 mL/min (ref >60)
GFR calc Af Amer: >60 mL/min (ref >60)
GFR calc non Af Amer: >60 mL/min (ref >60)
GFR calc non Af Amer: >60 mL/min (ref >60)
Glucose, Bld: 105 mg/dL — ABNORMAL HIGH (ref 70–99)
Potassium: 4.4 mEq/L (ref 3.5–5.1)
Potassium: 4.5 mEq/L (ref 3.5–5.1)
Sodium: 141 mEq/L (ref 135–145)

## 2011-01-28 LAB — COMPREHENSIVE METABOLIC PANEL
ALT: 10 U/L (ref 0–53)
ALT: 14 U/L (ref 0–53)
AST: 12 U/L (ref 0–37)
AST: 18 U/L (ref 0–37)
AST: 19 U/L (ref 0–37)
Albumin: 2.4 g/dL — ABNORMAL LOW (ref 3.5–5.2)
Albumin: 2.5 g/dL — ABNORMAL LOW (ref 3.5–5.2)
Alkaline Phosphatase: 29 U/L — ABNORMAL LOW (ref 39–117)
Alkaline Phosphatase: 30 U/L — ABNORMAL LOW (ref 39–117)
Alkaline Phosphatase: 33 U/L — ABNORMAL LOW (ref 39–117)
BUN: 41 mg/dL — ABNORMAL HIGH (ref 6–23)
BUN: 44 mg/dL — ABNORMAL HIGH (ref 6–23)
Calcium: 8.2 mg/dL — ABNORMAL LOW (ref 8.4–10.5)
Creatinine, Ser: 1.26 mg/dL (ref 0.4–1.5)
GFR calc Af Amer: >60 mL/min (ref >60)
GFR calc Af Amer: >60 mL/min (ref >60)
GFR calc Af Amer: >60 mL/min (ref >60)
Glucose, Bld: 145 mg/dL — ABNORMAL HIGH (ref 70–99)
Glucose, Bld: 91 mg/dL (ref 70–99)
Potassium: 3.7 mEq/L (ref 3.5–5.1)
Potassium: 3.7 mEq/L (ref 3.5–5.1)
Potassium: 3.9 mEq/L (ref 3.5–5.1)
Sodium: 138 mEq/L (ref 135–145)
Sodium: 142 mEq/L (ref 135–145)
Total Protein: 4.5 g/dL — ABNORMAL LOW (ref 6.0–8.3)
Total Protein: 4.9 g/dL — ABNORMAL LOW (ref 6.0–8.3)
Total Protein: 5.5 g/dL — ABNORMAL LOW (ref 6.0–8.3)

## 2011-01-28 LAB — PROTIME-INR
INR: 1.16 (ref 0.00–1.49)
INR: 1.19 (ref 0.00–1.49)
Prothrombin Time: 14.7 seconds (ref 11.6–15.2)

## 2011-01-28 LAB — HEMOGLOBIN AND HEMATOCRIT, BLOOD
HCT: 25.5 % — ABNORMAL LOW (ref 39.0–52.0)
HCT: 27.6 % — ABNORMAL LOW (ref 39.0–52.0)
Hemoglobin: 8.6 g/dL — ABNORMAL LOW (ref 13.0–17.0)

## 2011-01-28 LAB — PREPARE RBC (CROSSMATCH)

## 2011-01-28 LAB — POCT I-STAT, CHEM 8
BUN: 31 mg/dL — ABNORMAL HIGH (ref 6–23)
Calcium, Ion: 1.16 mmol/L (ref 1.12–1.32)
Chloride: 104 mEq/L (ref 96–112)
Creatinine, Ser: 1.2 mg/dL (ref 0.4–1.5)
Glucose, Bld: 118 mg/dL — ABNORMAL HIGH (ref 70–99)
HCT: 26 % — ABNORMAL LOW (ref 39.0–52.0)
Potassium: 4.8 mEq/L (ref 3.5–5.1)

## 2011-01-28 LAB — CARDIAC PANEL(CRET KIN+CKTOT+MB+TROPI)
CK, MB: 1.8 ng/mL (ref 0.3–4.0)
Total CK: 29 U/L (ref 7–232)

## 2011-01-28 LAB — CK TOTAL AND CKMB (NOT AT ARMC)
CK, MB: 1.8 ng/mL (ref 0.3–4.0)
Relative Index: INVALID (ref 0.0–2.5)
Total CK: 64 U/L (ref 7–232)

## 2011-01-28 LAB — APTT
aPTT: 25 seconds (ref 24–37)
aPTT: 28 seconds (ref 24–37)

## 2011-01-28 LAB — URINE CULTURE
Colony Count: NO GROWTH
Culture: NO GROWTH

## 2011-01-28 LAB — TROPONIN I: Troponin I: 0.01 ng/mL (ref 0.00–0.06)

## 2011-01-28 LAB — HEMOGLOBIN A1C
Hgb A1c MFr Bld: 6 % (ref 4.6–6.1)
Mean Plasma Glucose: 126 mg/dL

## 2011-01-28 LAB — HEMOCCULT GUIAC POC 1CARD (OFFICE): Fecal Occult Bld: POSITIVE

## 2011-01-28 LAB — SAMPLE TO BLOOD BANK

## 2011-01-28 LAB — TSH: TSH: 2.052 u[IU]/mL (ref 0.350–4.500)

## 2011-01-28 LAB — CORTISOL: Cortisol, Plasma: 5.5 ug/dL

## 2011-01-28 LAB — ABO/RH: ABO/RH(D): A POS

## 2011-02-18 NOTE — Op Note (Signed)
  Manuel [name], Manuel [name]              ACCOUNT NO.:  [id number]  MEDICAL RECORD NO.:  [id number]           PATIENT TYPE:  O  LOCATION:  [facility]                         FACILITY:  [facility]  PHYSICIAN:  Suzanna Obey, M.D.       DATE OF BIRTH:  08-29-1922  DATE OF PROCEDURE:  01/21/2011 DATE OF DISCHARGE:  01/21/2011                              OPERATIVE REPORT   PREOPERATIVE DIAGNOSIS:  Zenker diverticulum.  POSTOPERATIVE DIAGNOSIS:  Zenker diverticulum.  SURGICAL PROCEDURE:  Esophagoscopy with aborted Zenker diverticulectomy.  SURGEON:  Suzanna Obey, MD  ANESTHESIA:  General.  ESTIMATED BLOOD LOSS:  Less than 100 mL.  INDICATIONS:  This is an 77 year old who has had increasing difficulty with Zenker's that has been present since at least 2003 that was first identified.  He has another swallow test that showed an increased size. He wants to proceed with the endoscopic procedure which was discussed. The risks and benefits as well as options were discussed.  All his questions were answered and consent was obtained.  OPERATIVE DETAILS:  The patient was taken to the operating room and placed in supine position after general endotracheal tube anesthesia was placed in the supine position.  His neck was quite stiff, meaning the extension was limited.  The Zenker diverticuloscope was inserted after placing a tooth guard.  The larynx was identified.  Going behind the larynx to the cricopharyngeus muscle, the scope was easily passed.  The Zenker's was not readily identified.  With pulling the scope back in slightly and then readjusting, I could see the debris coming out from the Zenker's but the band of cricopharyngeus muscle was not readily identified.  The sac was more or less off to the side and I could not get the scope to tense the muscle and open for visualization of both the Zenker's and the esophagus to allow insertion of the stapler.  The debris was cleaned out with a side-port suction as  it could not be placed into it.  It can only be side ported out.  The scope was removed. The esophagoscope was inserted just to make sure everything was okay. There were no lacerations or injury.  The esophagoscope could easily be inserted into the esophagus.  There did not appear to be any lesions or issues.  The scope was removed.  The patient was then awakened and brought to recovery in stable condition.  Counts were correct.         ______________________________ Suzanna Obey, M.D.    JB/MEDQ  D:  01/21/2011  T:  01/22/2011  Job:  [id number]  Electronically Signed by Suzanna Obey M.D. on 02/18/2011 09:30:04 AM

## 2011-03-09 NOTE — Procedures (Signed)
CAROTID DUPLEX EXAM   INDICATION:  Followup carotid artery disease.   HISTORY:  Diabetes:  No.  Cardiac:  Arrhythmia.  Hypertension:  Yes.  Smoking:  No.  Previous Surgery:  No.  CV History:  Asymptomatic.  Amaurosis Fugax No, Paresthesias No, Hemiparesis No                                       RIGHT             LEFT  Brachial systolic pressure:         132               138  Brachial Doppler waveforms:         WNL               WNL  Vertebral direction of flow:        Antegrade         Antegrade  DUPLEX VELOCITIES (cm/sec)  CCA peak systolic                   84                104  ECA peak systolic                   150               156  ICA peak systolic                   259               171  ICA end diastolic                   62                39  PLAQUE MORPHOLOGY:                  Mixed             Mixed  PLAQUE AMOUNT:                      Moderate / severe Moderate  PLAQUE LOCATION:                    ICA / ECA         ICA / ECA   IMPRESSION:  1. Right internal carotid artery shows evidence of 60-79% stenosis and      appears stable.  2. Left internal carotid artery shows evidence of 40-59% stenosis with      an increase in velocities from previous study, however, no category      change.   ___________________________________________  Di Kindle. Edilia Bo, M.D.   AS/MEDQ  D:  08/29/2009  T:  08/29/2009  Job:  [id number]

## 2011-03-09 NOTE — Procedures (Signed)
CAROTID DUPLEX EXAM   INDICATION:  Followup known carotid disease.   HISTORY:  Diabetes:  No.  Cardiac:  Arrhythmia.  Hypertension:  Yes.  Smoking:  No.  Previous Surgery:  No.  CV History:  Asymptomatic.  Amaurosis Fugax No, Paresthesias No, Hemiparesis No                                       RIGHT             LEFT  Brachial systolic pressure:         122               128  Brachial Doppler waveforms:         Normal            Normal  Vertebral direction of flow:        Antegrade         Antegrade  DUPLEX VELOCITIES (cm/sec)  CCA peak systolic                   83                68  ECA peak systolic                   132               127  ICA peak systolic                   216               113  ICA end diastolic                   59                27  PLAQUE MORPHOLOGY:                  Mixed             Mixed  PLAQUE AMOUNT:                      Moderate to severe                  Moderate  PLAQUE LOCATION:                    ICA and ECA       ICA and ECA   IMPRESSION:  1. Doppler velocity suggests 60%-79% stenosis in the right internal      carotid artery.  2. Doppler velocity suggests 40%-59% stenosis in the left internal      carotid artery.  3. Antegrade flow noted in the bilateral vertebral arteries.  4. Stable from previous exams.   ___________________________________________  Di Kindle. Edilia Bo, M.D.   NT/MEDQ  D:  05/07/2010  T:  05/07/2010  Job:  [id number]

## 2011-03-09 NOTE — Assessment & Plan Note (Signed)
OFFICE VISIT   Manuel [name], Manuel [name]  DOB:  08-Apr-1922                                       05/07/2010  YNWGN#:[id number]   I saw the patient in the office today for continued followup of his  carotid disease.  This is a former patient of Dr. Florina Ou.  This is a  pleasant 77 year old right-handed gentleman who has been followed with  bilateral carotid disease.  He was last seen in our office in March of  2006 and his most recent carotid duplex scan was done in November of  2010 and he was noted to have a 60% - 79% right carotid stenosis with a  40% - 59% left carotid stenosis.  Since he was seen last he denies any  history of stroke, TIAs, expressive or receptive aphasia or amaurosis  fugax.   SOCIAL HISTORY:  He is married.  He has three children.  He does not  smoke cigarettes.   REVIEW OF SYSTEMS:  CARDIOVASCULAR:  He has had no chest pain, chest  pressure, palpitations or arrhythmias.  He has had no history of  claudication or rest pain.  He has had no history of DVT or phlebitis.  PULMONARY:  He has had no productive cough, bronchitis, asthma or  wheezing.   PHYSICAL EXAMINATION:  General:  This is a pleasant 77 year old  gentleman who appears his stated age.  Vital signs:  Blood pressure is  153/81, heart rate is 71, saturation 98%.  HEENT:  Unremarkable.  Lungs:  Are clear bilaterally to auscultation without rales, rhonchi or  wheezing.  Cardiovascular:  He has bilateral carotid bruits.  He has a  regular rate and rhythm.  He has palpable femoral pulses with no  significant lower extremity edema.  Abdomen:  Soft and nontender with  normal pitched bowel sounds.  Neurologic:  He has no focal weakness or  paresthesias.   I have independently interpreted his carotid duplex scan today which  shows evidence of a 60% - 79% right carotid stenosis and a 40% - 59%  left carotid stenosis.  Both vertebral arteries are patent with normally  directed flow.  The  velocities on both sides have not increased at all  since his study in November of 2010.   He understands we typically would not consider carotid endarterectomy in  a normal risk patient unless the stenosis was greater than 80% or he  developed new neurologic symptoms.  I have recommend a followup carotid  duplex scan in 6 months and I have ordered that test.  I think we need  to follow his carotids closely as he is unable to take aspirin or Plavix  because of history of GI bleeds in the past.  He knows to call sooner if  he has any problems.     Di Kindle. Edilia Bo, M.D.  Electronically Signed   CSD/MEDQ  D:  05/07/2010  T:  05/08/2010  Job:  [id number]   cc:   Holley Bouche, M.D.  Vesta Mixer, M.D.

## 2011-03-09 NOTE — Procedures (Signed)
CAROTID DUPLEX EXAM   INDICATION:  Followup carotid artery disease.   HISTORY:  Diabetes:  No.  Cardiac:  Arrhythmia.  Hypertension:  Yes.  Smoking:  No.  Previous Surgery:  No.  CV History:  Asymptomatic.  Amaurosis Fugax No, Paresthesias No, Hemiparesis No                                       RIGHT             LEFT  Brachial systolic pressure:         126               122  Brachial Doppler waveforms:         Normal            Normal  Vertebral direction of flow:        Antegrade         Antegrade  DUPLEX VELOCITIES (cm/sec)  CCA peak systolic                   92                95  ECA peak systolic                   196               178  ICA peak systolic                   253               124  ICA end diastolic                   43                21  PLAQUE MORPHOLOGY:                  Mixed             Mixed  PLAQUE AMOUNT:                      Moderate/severe   Moderate  PLAQUE LOCATION:                    ICA/ECA/CCA       ICA/ECA/CCA   IMPRESSION:  1. 60-79% stenosis of the right internal carotid artery.  2. 40-59% stenosis of the left internal carotid artery.  3. No significant change noted when compared to the previous exam on      02/29/2008.   ___________________________________________  P. Liliane Bade, M.D.   CH/MEDQ  D:  09/12/2008  T:  09/12/2008  Job:  [id number]

## 2011-03-09 NOTE — Procedures (Signed)
CAROTID DUPLEX EXAM   INDICATION:  Carotid artery disease.   HISTORY:  Diabetes:  No.  Cardiac:  Arrhythmia.  Hypertension:  Yes.  Smoking:  No.  Previous Surgery:  No.  CV History:  Asymptomatic.  Amaurosis Fugax No, Paresthesias No, Hemiparesis No.                                       RIGHT             LEFT  Brachial systolic pressure:         128               124  Brachial Doppler waveforms:         Normal            Normal  Vertebral direction of flow:        Antegrade         Antegrade  DUPLEX VELOCITIES (cm/sec)  CCA peak systolic                   109               87  ECA peak systolic                   134               173  ICA peak systolic                   256               120  ICA end diastolic                   63                21  PLAQUE MORPHOLOGY:                  Mixed             Mixed  PLAQUE AMOUNT:                      Moderate/severe   Mild  PLAQUE LOCATION:                    ICA/ECA           ICA/ECA   IMPRESSION:  1. 60-79% stenosis of the right internal carotid artery.  2. Low-end 40-59% stenosis of the left internal carotid artery.  3. No significant change noted when compared to the previous      examination on 09/12/08.       ___________________________________________  P. Liliane Bade, M.D.   CH/MEDQ  D:  03/19/2009  T:  03/19/2009  Job:  [id number]   cc:   Vesta Mixer, M.D.

## 2011-03-09 NOTE — Procedures (Signed)
CAROTID DUPLEX EXAM   INDICATION:  Followup carotid artery disease.   HISTORY:  Diabetes:  No.  Cardiac:  Arrhythmia.  Hypertension:  Yes.  Smoking:  No.  Previous Surgery:  No.  CV History:  Asymptomatic.  Amaurosis Fugax No, Paresthesias No, Hemiparesis No                                       RIGHT             LEFT  Brachial systolic pressure:         125               120  Brachial Doppler waveforms:         Biphasic          Biphasic  Vertebral direction of flow:        Antegrade         Antegrade  DUPLEX VELOCITIES (cm/sec)  CCA peak systolic                   108               118  ECA peak systolic                   190               192  ICA peak systolic                   271               130  ICA end diastolic                   81                38  PLAQUE MORPHOLOGY:                  Mixed             Mixed  PLAQUE AMOUNT:                      Moderate          Mild/moderate  PLAQUE LOCATION:                    ICA/ECA/CCA       ICA/ECA/CCA   IMPRESSION:  1. Right ICA shows evidence of 60-79% stenosis.  2. Left ICA shows evidence of 40-59% stenosis.  3. Bilateral ECA stenosis.  4. Bilateral ICA velocities have increased from previous study.   ___________________________________________  P. Liliane Bade, M.D.   AS/MEDQ  D:  02/29/2008  T:  02/29/2008  Job:  [id number]

## 2011-06-23 ENCOUNTER — Emergency Department (HOSPITAL_COMMUNITY): Payer: Medicare Other

## 2011-06-23 ENCOUNTER — Emergency Department (HOSPITAL_COMMUNITY)
Admission: EM | Admit: 2011-06-23 | Discharge: 2011-06-23 | Disposition: A | Discharge status: Home / Self Care | Payer: Medicare Other | Attending: Emergency Medicine | Admitting: Emergency Medicine

## 2011-06-23 DIAGNOSIS — S51809A Unspecified open wound of unspecified forearm, initial encounter: Secondary | ICD-10-CM | POA: Insufficient documentation

## 2011-06-23 DIAGNOSIS — W101XXA Fall (on)(from) sidewalk curb, initial encounter: Secondary | ICD-10-CM | POA: Insufficient documentation

## 2011-06-23 DIAGNOSIS — I10 Essential (primary) hypertension: Secondary | ICD-10-CM | POA: Insufficient documentation

## 2011-06-23 DIAGNOSIS — Y9229 Other specified public building as the place of occurrence of the external cause: Secondary | ICD-10-CM | POA: Insufficient documentation

## 2011-12-10 ENCOUNTER — Other Ambulatory Visit (INDEPENDENT_AMBULATORY_CARE_PROVIDER_SITE_OTHER): Payer: Medicare Other | Admitting: *Deleted

## 2011-12-10 DIAGNOSIS — I6529 Occlusion and stenosis of unspecified carotid artery: Secondary | ICD-10-CM

## 2011-12-16 ENCOUNTER — Encounter: Payer: Self-pay | Admitting: Vascular Surgery

## 2011-12-16 NOTE — Procedures (Unsigned)
CAROTID DUPLEX EXAM  INDICATION:  Carotid disease.  HISTORY: Diabetes:  No Cardiac:  Arrhythmia Hypertension:  Yes Smoking:  No Previous Surgery:  No CV History:  Currently asymptomatic Amaurosis Fugax No, Paresthesias No, Hemiparesis No                                      RIGHT               LEFT Brachial systolic pressure:         126                 130 Brachial Doppler waveforms:         Normal              Normal Vertebral direction of flow:        Antegrade           Antegrade DUPLEX VELOCITIES (cm/sec) CCA peak systolic                   94                  90 ECA peak systolic                   186                 141 ICA peak systolic                   212                 119 ICA end diastolic                   29                  16 PLAQUE MORPHOLOGY:                  Heterogeneous/mixed Heterogeneous PLAQUE AMOUNT:                      Moderate            Mild PLAQUE LOCATION:                    ICA/ECA             ICA/ECA  IMPRESSION: 1. Resistive Doppler waveforms noted throughout the bilateral carotid     arteries. 2. Doppler velocities and plaque formations suggest high-end 40 to 59%     stenosis of the right proximal internal carotid artery, and 1 to     39% stenosis of the left proximal internal carotid artery. 3. No significant change noted when compared to the previous exam on     12/09/2010.  ___________________________________________ Di Kindle. Edilia Bo, M.D.  CH/MEDQ  D:  12/10/2011  T:  12/10/2011  Job:  [id number]

## 2012-03-10 ENCOUNTER — Encounter (HOSPITAL_COMMUNITY): Payer: Self-pay | Admitting: *Deleted

## 2012-03-10 ENCOUNTER — Other Ambulatory Visit: Payer: Self-pay | Admitting: Family Medicine

## 2012-03-10 ENCOUNTER — Emergency Department (HOSPITAL_COMMUNITY)
Admission: EM | Admit: 2012-03-10 | Discharge: 2012-03-11 | Disposition: A | Discharge status: Home / Self Care | Payer: Medicare Other | Attending: Emergency Medicine | Admitting: Emergency Medicine

## 2012-03-10 ENCOUNTER — Ambulatory Visit (HOSPITAL_COMMUNITY)
Admission: RE | Admit: 2012-03-10 | Discharge: 2012-03-10 | Disposition: A | Discharge status: Home / Self Care | Payer: Medicare Other | Source: Ambulatory Visit | Attending: Family Medicine | Admitting: Family Medicine

## 2012-03-10 ENCOUNTER — Other Ambulatory Visit (HOSPITAL_COMMUNITY): Payer: Self-pay | Admitting: Family Medicine

## 2012-03-10 DIAGNOSIS — M545 Low back pain, unspecified: Secondary | ICD-10-CM | POA: Insufficient documentation

## 2012-03-10 DIAGNOSIS — M5137 Other intervertebral disc degeneration, lumbosacral region: Secondary | ICD-10-CM | POA: Insufficient documentation

## 2012-03-10 DIAGNOSIS — R29898 Other symptoms and signs involving the musculoskeletal system: Secondary | ICD-10-CM | POA: Insufficient documentation

## 2012-03-10 DIAGNOSIS — R5381 Other malaise: Secondary | ICD-10-CM | POA: Insufficient documentation

## 2012-03-10 DIAGNOSIS — R209 Unspecified disturbances of skin sensation: Secondary | ICD-10-CM | POA: Insufficient documentation

## 2012-03-10 DIAGNOSIS — M899 Disorder of bone, unspecified: Secondary | ICD-10-CM | POA: Insufficient documentation

## 2012-03-10 DIAGNOSIS — M51379 Other intervertebral disc degeneration, lumbosacral region without mention of lumbar back pain or lower extremity pain: Secondary | ICD-10-CM | POA: Insufficient documentation

## 2012-03-10 DIAGNOSIS — R531 Weakness: Secondary | ICD-10-CM

## 2012-03-10 DIAGNOSIS — R110 Nausea: Secondary | ICD-10-CM | POA: Insufficient documentation

## 2012-03-10 DIAGNOSIS — I10 Essential (primary) hypertension: Secondary | ICD-10-CM | POA: Insufficient documentation

## 2012-03-10 DIAGNOSIS — Z79899 Other long term (current) drug therapy: Secondary | ICD-10-CM | POA: Insufficient documentation

## 2012-03-10 DIAGNOSIS — R5383 Other fatigue: Secondary | ICD-10-CM | POA: Insufficient documentation

## 2012-03-10 HISTORY — DX: Essential (primary) hypertension: I10

## 2012-03-10 HISTORY — DX: Hemorrhage of anus and rectum: K62.5

## 2012-03-10 LAB — COMPREHENSIVE METABOLIC PANEL
ALT: 13 U/L (ref 0–53)
Alkaline Phosphatase: 56 U/L (ref 39–117)
CO2: 26 mEq/L (ref 19–32)
GFR calc Af Amer: 71 mL/min — ABNORMAL LOW (ref >90)
GFR calc non Af Amer: 61 mL/min — ABNORMAL LOW (ref >90)
Glucose, Bld: 98 mg/dL (ref 70–99)
Potassium: 4.5 mEq/L (ref 3.5–5.1)
Sodium: 138 mEq/L (ref 135–145)

## 2012-03-10 LAB — CBC
HCT: 39.3 % (ref 39.0–52.0)
Hemoglobin: 13.4 g/dL (ref 13.0–17.0)
MCH: 29.4 pg (ref 26.0–34.0)
MCHC: 34.1 g/dL (ref 30.0–36.0)
MCV: 86.2 fL (ref 78.0–100.0)
Platelets: 183 10*3/uL (ref 150–400)
RBC: 4.56 MIL/uL (ref 4.22–5.81)
RDW: 14.3 % (ref 11.5–15.5)
WBC: 7.2 10*3/uL (ref 4.0–10.5)

## 2012-03-10 LAB — URINALYSIS, ROUTINE W REFLEX MICROSCOPIC
Bilirubin Urine: NEGATIVE
Hgb urine dipstick: NEGATIVE
Ketones, ur: NEGATIVE mg/dL
Nitrite: NEGATIVE
Urobilinogen, UA: 0.2 mg/dL (ref 0.0–1.0)
pH: 7.5 (ref 5.0–8.0)

## 2012-03-10 MED ORDER — SODIUM CHLORIDE 0.9 % IV BOLUS (SEPSIS)
500.0000 mL | Freq: Once | INTRAVENOUS | Status: AC
  Administered 2012-03-10: 500 mL via INTRAVENOUS

## 2012-03-10 MED ORDER — GADOBENATE DIMEGLUMINE 529 MG/ML IV SOLN
17.0000 mL | Freq: Once | INTRAVENOUS | Status: AC | PRN
  Administered 2012-03-10: 17 mL via INTRAVENOUS

## 2012-03-10 NOTE — ED Notes (Signed)
Pt reports having an MRI done this afternoon at 2pm and approx 1845 began feeling "sick", clammy, pale, nauseous, weak. Hx of irregular heartbeat.

## 2012-03-10 NOTE — Discharge Instructions (Signed)
Follow up with primary care doctor Monday.  Your heart rate is borderline low, similar to prior, follow up with your doctor.  Return to ER if worse, symptoms recur, weak/faint, fevers, chest discomfort, trouble breathing, other concern.         Weakness, Generalized Without Cause Your caregiver has seen you today because you are having problems with feelings of weakness. Weakness has many different causes, some of which are common and others are very rare. The causes of weakness are so numerous they could not all be listed on this page. The exam and other tests done today do not reveal a specific cause for the weakness that is an immediate danger or something that is treatable. Your caregiver has checked you for the most common causes of weakness and feels it is safe for you to go home and be observed. HOME CARE INSTRUCTIONS   For the time being, obtain more rest if needed.   Eat a well balanced diet.   Try to get at least some exercise every day in spite of how difficult it may seem at times. In the case of the elderly, exercise is especially important. As we grow older, there is a loss of muscle mass. Generally, there is also a loss of, or decrease in, activity that comes naturally with the aging process. Exercise and increased activities are the only tools we have to combat this natural process.   The results of some tests ordered today may not be available right away. You will be contacted with those results when they become available.   It is important to follow through with your physician as per instructions that you may have received today.  SEEK MEDICAL CARE IF:   You have any new concerns which you do not feel were dealt with today.   The weakness seems to be getting progressively worse.   You develop new or unusual aches or pains.  SEEK IMMEDIATE MEDICAL CARE IF:   You are unable to tend to your usual daily activities such as simply getting dressed, feeding yourself, or  keeping up with your personal hygiene.   You develop inability to walk stairs or perform your usual daily activities.   You develop shortness of breath, chest pain, have difficulty moving parts of your body, or develop new problems for which you have not talked to your caregiver.   You experience difficulty speaking or swallowing.   You develop loss of control of bladder or bowels that was not present before.  Document Released: 10/11/2005 Document Revised: 09/30/2011 Document Reviewed: 03/23/2007 [facility] Patient Information 2012 [place], [place].

## 2012-03-10 NOTE — ED Notes (Signed)
Pillow placed underneath knees per pt request for comfort measures at 2105 and another warm blanket applied

## 2012-03-10 NOTE — ED Provider Notes (Addendum)
History     CSN: [id number]  Arrival date & time 03/10/12  1910   First MD Initiated Contact with Patient 03/10/12 2013      Chief Complaint  Patient presents with  . Weakness  . Nausea    (Consider location/radiation/quality/duration/timing/severity/associated sxs/prior treatment) The history is provided by the patient.  pt reports having mri done today to further evaluate ongoing/chronic back pain. States had eaten breakfast, but not lunch or dinner. Had driven family member to [place], and while on road back began to feel nauseated, then felt clammy, more nauseated, generally weak. No associated chest pain or discomfort. No sob. No palpitations or irregular or fast heart beat. No loc. Single episode emesis, clear. No abd pain. Has been having normal bms, no diarrhea or constipation. No rectal bleeding or melena. Denies headache. No numbness/ unilateral weakness. No dizziness. Pt denies any recent change in meds or new meds. No fever or chills. No gu c/o.   Past Medical History  Diagnosis Date  . Hypertension   . Irregular heart beat   . Rectal bleed     Past Surgical History  Procedure Date  . Back surgery   . Appendectomy   . Abdominal surgery   . Hernia repair     No family history on file.  History  Substance Use Topics  . Smoking status: Never Smoker   . Smokeless tobacco: Not on file  . Alcohol Use: No      Review of Systems  Constitutional: Negative for fever and chills.  HENT: Negative for sore throat, neck pain and neck stiffness.   Eyes: Negative for pain and visual disturbance.  Respiratory: Negative for cough and shortness of breath.   Cardiovascular: Negative for chest pain, palpitations and leg swelling.  Gastrointestinal: Negative for abdominal pain and blood in stool.  Genitourinary: Negative for dysuria and flank pain.  Musculoskeletal: Negative for back pain.  Skin: Negative for rash.  Neurological: Negative for numbness and headaches.    Hematological: Does not bruise/bleed easily.  Psychiatric/Behavioral: Negative for confusion.    Allergies  Codeine; Penicillins; Rofecoxib; and Sulfonamide derivatives  Home Medications   Current Outpatient Rx  Name Route Sig Dispense Refill  . OCUVITE PO TABS Oral Take 1 tablet by mouth daily.    Marland Kitchen VITAMIN D 1000 UNITS PO TABS Oral Take 2,000 Units by mouth daily.    . CO ENZYME Q-10 PO Oral Take 1 tablet by mouth daily.    Marland Kitchen ESOMEPRAZOLE MAGNESIUM 20 MG PO CPDR Oral Take 20 mg by mouth daily before breakfast.    . FLAXSEED (LINSEED) PO Oral Take 1 tablet by mouth daily.    . MULTIVITAMINS PO TABS Oral Take 1 tablet by mouth daily.    . OXYCODONE-ACETAMINOPHEN 5-325 MG PO TABS Oral Take 1 tablet by mouth every 6 (six) hours as needed.      BP 154/105  Temp(Src) 98.2 F (36.8 C) (Oral)  Resp 19  SpO2 99%  Physical Exam  Nursing note and vitals reviewed. Constitutional: He is oriented to person, place, and time. He appears well-developed and well-nourished. No distress.  HENT:  Head: Atraumatic.  Eyes: Conjunctivae are normal. Pupils are equal, round, and reactive to light. No scleral icterus.  Neck: Neck supple. No tracheal deviation present.  Cardiovascular: Normal rate, regular rhythm, normal heart sounds and intact distal pulses.   Pulmonary/Chest: Effort normal and breath sounds normal. No accessory muscle usage. No respiratory distress.  Abdominal: Soft. Bowel sounds are normal. He  exhibits no distension and no mass. There is no tenderness. There is no rebound and no guarding.       No pulsatile mass  Musculoskeletal: Normal range of motion. He exhibits no edema and no tenderness.  Neurological: He is alert and oriented to person, place, and time. No cranial nerve deficit.  Skin: Skin is warm and dry.  Psychiatric: He has a normal mood and affect.    ED Course  Procedures (including critical care time)  Labs Reviewed - No data to display Mr Lumbar Spine W Wo  Contrast  03/10/2012  *RADIOLOGY REPORT*  Clinical Data: Back pain with weakness and numbness in both legs. History of remote back surgery.  MRI LUMBAR SPINE WITHOUT AND WITH CONTRAST  Technique:  Multiplanar and multiecho pulse sequences of the lumbar spine were obtained without and with intravenous contrast.  Contrast: 17mL MULTIHANCE GADOBENATE DIMEGLUMINE 529 MG/ML IV SOLN  Comparison: Abdominal pelvic CT 08/23/2009.  Findings: Prior studies demonstrate five lumbar type vertebral bodies.  The alignment is normal.  There is chronic endplate degenerative change asymmetric to the right at L4-L5.  There is a chronic right-sided L5 pars defect.  A 10 mm lesion within the posterior aspect of the L3 vertebral body demonstrates low signal on all pulse sequences and was sclerotic on CT.  This appears unchanged in size.  The conus medullaris extends to the L1 level and appears normal. There are no paraspinal abnormalities.  L1-L2:  Normal interspace.  L2-L3:  Mild disc bulging with anterior osteophytes.  No spinal stenosis or nerve root encroachment.  L3-L4:  Disc bulging, facet and ligamentous hypertrophy contribute to mild central and left greater than right lateral recess stenosis.  There is no foraminal compromise or L3 nerve root encroachment  L4-L5:  The spinal canal appears adequately decompressed by the prior laminectomies.  There is chronic disc degeneration with asymmetric osteophyte formation on the right.  Chronic mild right foraminal stenosis appears stable.  There is mild facet and ligamentous hypertrophy.  L5-S1:  Chronic right-sided pars defect and mild bilateral facet hypertrophy.  The disc height and hydration are maintained.  There is no anterolisthesis or right foraminal stenosis.  The left foramen is mildly narrowed superiorly by facet joint osteophytes. This appears unchanged from the prior CT.  IMPRESSION:  1.  Mild central and lateral recess stenosis bilaterally at L3-L4. 2.  Postsurgical changes  at L4-L5 with adequate decompression of the spinal canal.  There is chronic mild right foraminal stenosis secondary to osteophytes. 3.  Chronic pars defect on the right at L5-S1 without anterolisthesis.  Chronic left foraminal stenosis due to osteophytes. 4.  Stable sclerotic lesion in the L3 vertebral body. 5.  No acute findings identified.  Original Report Authenticated By: Gerrianne Scale, M.D.    Results for orders placed during the hospital encounter of 03/10/12  CBC      Component Value Range   WBC 7.2  4.0 - 10.5 (K/uL)   RBC 4.56  4.22 - 5.81 (MIL/uL)   Hemoglobin 13.4  13.0 - 17.0 (g/dL)   HCT 86.5  78.4 - 69.6 (%)   MCV 86.2  78.0 - 100.0 (fL)   MCH 29.4  26.0 - 34.0 (pg)   MCHC 34.1  30.0 - 36.0 (g/dL)   RDW 29.5  28.4 - 13.2 (%)   Platelets 183  150 - 400 (K/uL)  COMPREHENSIVE METABOLIC PANEL      Component Value Range   Sodium 138  135 - 145 (  mEq/L)   Potassium 4.5  3.5 - 5.1 (mEq/L)   Chloride 103  96 - 112 (mEq/L)   CO2 26  19 - 32 (mEq/L)   Glucose, Bld 98  70 - 99 (mg/dL)   BUN 27 (*) 6 - 23 (mg/dL)   Creatinine, Ser 4.78  0.50 - 1.35 (mg/dL)   Calcium 8.9  8.4 - 29.5 (mg/dL)   Total Protein 6.9  6.0 - 8.3 (g/dL)   Albumin 3.6  3.5 - 5.2 (g/dL)   AST 20  0 - 37 (U/L)   ALT 13  0 - 53 (U/L)   Alkaline Phosphatase 56  39 - 117 (U/L)   Total Bilirubin 0.2 (*) 0.3 - 1.2 (mg/dL)   GFR calc non Af Amer 61 (*) >90 (mL/min)   GFR calc Af Amer 71 (*) >90 (mL/min)  URINALYSIS, ROUTINE W REFLEX MICROSCOPIC      Component Value Range   Color, Urine YELLOW  YELLOW    APPearance CLEAR  CLEAR    Specific Gravity, Urine 1.011  1.005 - 1.030    pH 7.5  5.0 - 8.0    Glucose, UA NEGATIVE  NEGATIVE (mg/dL)   Hgb urine dipstick NEGATIVE  NEGATIVE    Bilirubin Urine NEGATIVE  NEGATIVE    Ketones, ur NEGATIVE  NEGATIVE (mg/dL)   Protein, ur NEGATIVE  NEGATIVE (mg/dL)   Urobilinogen, UA 0.2  0.0 - 1.0 (mg/dL)   Nitrite NEGATIVE  NEGATIVE    Leukocytes, UA NEGATIVE  NEGATIVE     TROPONIN I      Component Value Range   Troponin I <0.30  <0.30 (ng/mL)   Mr Lumbar Spine W Wo Contrast  03/10/2012  *RADIOLOGY REPORT*  Clinical Data: Back pain with weakness and numbness in both legs. History of remote back surgery.  MRI LUMBAR SPINE WITHOUT AND WITH CONTRAST  Technique:  Multiplanar and multiecho pulse sequences of the lumbar spine were obtained without and with intravenous contrast.  Contrast: 17mL MULTIHANCE GADOBENATE DIMEGLUMINE 529 MG/ML IV SOLN  Comparison: Abdominal pelvic CT 08/23/2009.  Findings: Prior studies demonstrate five lumbar type vertebral bodies.  The alignment is normal.  There is chronic endplate degenerative change asymmetric to the right at L4-L5.  There is a chronic right-sided L5 pars defect.  A 10 mm lesion within the posterior aspect of the L3 vertebral body demonstrates low signal on all pulse sequences and was sclerotic on CT.  This appears unchanged in size.  The conus medullaris extends to the L1 level and appears normal. There are no paraspinal abnormalities.  L1-L2:  Normal interspace.  L2-L3:  Mild disc bulging with anterior osteophytes.  No spinal stenosis or nerve root encroachment.  L3-L4:  Disc bulging, facet and ligamentous hypertrophy contribute to mild central and left greater than right lateral recess stenosis.  There is no foraminal compromise or L3 nerve root encroachment  L4-L5:  The spinal canal appears adequately decompressed by the prior laminectomies.  There is chronic disc degeneration with asymmetric osteophyte formation on the right.  Chronic mild right foraminal stenosis appears stable.  There is mild facet and ligamentous hypertrophy.  L5-S1:  Chronic right-sided pars defect and mild bilateral facet hypertrophy.  The disc height and hydration are maintained.  There is no anterolisthesis or right foraminal stenosis.  The left foramen is mildly narrowed superiorly by facet joint osteophytes. This appears unchanged from the prior CT.   IMPRESSION:  1.  Mild central and lateral recess stenosis bilaterally at L3-L4. 2.  Postsurgical changes at L4-L5 with adequate decompression of the spinal canal.  There is chronic mild right foraminal stenosis secondary to osteophytes. 3.  Chronic pars defect on the right at L5-S1 without anterolisthesis.  Chronic left foraminal stenosis due to osteophytes. 4.  Stable sclerotic lesion in the L3 vertebral body. 5.  No acute findings identified.  Original Report Authenticated By: Gerrianne Scale, M.D.        MDM  Labs.   Reviewed nursing notes and talked w family to obtain additional history.      Date: 03/10/2012  Rate: 60  Rhythm: sinus arrhythmia  QRS Axis: normal  Intervals: normal  ST/T Wave abnormalities: normal  Conduction Disutrbances:none  Narrative Interpretation:   Old EKG Reviewed: unchanged    Pt hadnt eaten today - given meal tray, po fluids. Ambulates in hall.   Recheck pt states feels well, requests d/c.  Pt states is asymptomatic, no pain, no nv, no clamminess.  Remains in nsr on monitor, no dysrhythmia. No c/o, requests d/c.   Hr 60, pt borderline brady, in sinus. Reviewed prior notes, hr in 55-60 range. No bblocker or other rate control med use, pcp follow up.     Suzi Roots, MD 03/10/12 [id number]  Suzi Roots, MD 03/10/12 [phone]

## 2012-03-13 LAB — POCT I-STAT, CHEM 8
BUN: 28 mg/dL — ABNORMAL HIGH (ref 6–23)
Creatinine, Ser: 1.2 mg/dL (ref 0.50–1.35)
Glucose, Bld: 146 mg/dL — ABNORMAL HIGH (ref 70–99)
Hemoglobin: 13.9 g/dL (ref 13.0–17.0)
Potassium: 4.2 mEq/L (ref 3.5–5.1)

## 2012-08-07 ENCOUNTER — Ambulatory Visit (INDEPENDENT_AMBULATORY_CARE_PROVIDER_SITE_OTHER): Payer: Medicare Other | Admitting: Nurse Practitioner

## 2012-08-07 ENCOUNTER — Encounter: Payer: Self-pay | Admitting: Nurse Practitioner

## 2012-08-07 ENCOUNTER — Telehealth: Payer: Self-pay

## 2012-08-07 VITALS — BP 130/64 | HR 59 | Resp 18 | Ht 74.0 in | Wt 198.8 lb

## 2012-08-07 DIAGNOSIS — I10 Essential (primary) hypertension: Secondary | ICD-10-CM

## 2012-08-07 LAB — BASIC METABOLIC PANEL
BUN: 24 mg/dL — ABNORMAL HIGH (ref 6–23)
CO2: 26 mEq/L (ref 19–32)
Calcium: 9.2 mg/dL (ref 8.4–10.5)
Chloride: 106 mEq/L (ref 96–112)
Creatinine, Ser: 1.1 mg/dL (ref 0.4–1.5)
GFR: 70.85 mL/min (ref >60.00)
Glucose, Bld: 89 mg/dL (ref 70–99)
Potassium: 4.3 mEq/L (ref 3.5–5.1)
Sodium: 140 mEq/L (ref 135–145)

## 2012-08-07 MED ORDER — LISINOPRIL 5 MG PO TABS: 5.0000 mg | ORAL_TABLET | Freq: Every day | ORAL | Status: DC

## 2012-08-07 NOTE — Telephone Encounter (Signed)
Received a call from patient's wife stating patient saw Norma Fredrickson NP this morning and was started on Lisinopril 5 mg daily.Stated patient took lisinopril 5 mg at 1:30 pm this afternoon and a couple of hours after taking lisinopril B/P 176/72.States at 4:45pm B/P 196/88 and patient has a bad headache.Spoke with Dr.Nahser he advised patient can take a extra Lisinopril 5 mg now.Advised to decrease sodium intake,relax and continue to monitor B/P.Advised to keep appointment with Norma Fredrickson NP 08/11/12.

## 2012-08-07 NOTE — Progress Notes (Signed)
Manuel [name] Date of Birth: 1921/12/22 Medical Record #[id number]  History of Present Illness: Manuel [name] is seen back today for a work in visit. He is seen for Dr. Elease Hashimoto. He has not been seen in about 2 years. Has HTN and past Mallory-Weiss tear. No longer on aspirin. Has been off of his HTN medicines since the GI bleed. His other issues include neuropathy.   He comes in today. He is here with his wife. He tries to stay quite active. He is now using a scooter to get around. Has had issues with his balance and falls frequently. He notes that for the past week or so his blood pressure is running 160 to 190. He has a little headache and some mild chest discomfort in association. He is concerned.   Current Outpatient Prescriptions on File Prior to Visit  Medication Sig Dispense Refill  . beta carotene w/minerals (OCUVITE) tablet Take 1 tablet by mouth daily.      . cholecalciferol (VITAMIN D) 1000 UNITS tablet Take 2,000 Units by mouth daily.      . CO ENZYME Q-10 PO Take 1 tablet by mouth daily.      Marland Kitchen esomeprazole (NEXIUM) 20 MG capsule Take 20 mg by mouth daily before breakfast.      . FLAXSEED, LINSEED, PO Take 1 tablet by mouth daily.      . multivitamin (THERAGRAN) per tablet Take 1 tablet by mouth daily.      Marland Kitchen oxyCODONE-acetaminophen (PERCOCET) 5-325 MG per tablet Take 1 tablet by mouth every 6 (six) hours as needed.      . ranitidine (ZANTAC) 300 MG tablet         Allergies  Allergen Reactions  . Codeine Other (See Comments)    Pass out  . Ivp Dye (Iodinated Diagnostic Agents)   . Penicillins Itching  . Rofecoxib     REACTION: elevated BP  . Sulfonamide Derivatives Hives and Itching    Past Medical History  Diagnosis Date  . Hypertension   . Irregular heart beat   . Rectal bleed     Past Surgical History  Procedure Date  . Back surgery   . Appendectomy   . Abdominal surgery   . Hernia repair     History  Smoking status  . Never Smoker   Smokeless tobacco  .  Not on file    History  Alcohol Use No    No family history on file.  Review of Systems: The review of systems is per the HPI.  All other systems were reviewed and are negative.  Physical Exam: BP 130/64  Pulse 59  Resp 18  Ht 6\' 2"  (1.88 m)  Wt 198 lb 12.8 oz (90.175 kg)  BMI 25.52 kg/m2  SpO2 93% Blood pressure by me is 184/80 in both arms.  Patient is very pleasant and in no acute distress. Looks younger than his stated age. He is in a motorized scooter. Skin is warm and dry. Color is normal.  HEENT is unremarkable. Normocephalic/atraumatic. PERRL. Sclera are nonicteric. Neck is supple. No masses. No JVD. Lungs are clear. Cardiac exam shows a regular rate and rhythm. Abdomen is soft. Extremities are without edema. Gait and ROM are intact. No gross neurologic deficits noted.   LABORATORY DATA: BMET is pending for today.     Chemistry      Component Value Date/Time   NA 138 03/10/2012 2103   K 4.5 03/10/2012 2103   CL 103 03/10/2012 2103  CO2 26 03/10/2012 2103   BUN 27* 03/10/2012 2103   CREATININE 1.06 03/10/2012 2103      Component Value Date/Time   CALCIUM 8.9 03/10/2012 2103   ALKPHOS 56 03/10/2012 2103   AST 20 03/10/2012 2103   ALT 13 03/10/2012 2103   BILITOT 0.2* 03/10/2012 2103     Lab Results  Component Value Date   WBC 7.2 03/10/2012   HGB 13.4 03/10/2012   HCT 39.3 03/10/2012   MCV 86.2 03/10/2012   PLT 183 03/10/2012   No results found for this basename: CHOL, HDL, LDLCALC, LDLDIRECT, TRIG, CHOLHDL     Assessment / Plan:  HTN - He used to be on Lisinopril/Hct. I am only restarting the Lisinopril at 5 mg. Would like to bring his blood pressure down slowly and avoid orthostasis. He no longer takes aspirin. He will continue to monitor at home and bring his cuff in for me to check. We will check a BMET today and on return.   Patient is agreeable to this plan and will call if any problems develop in the interim.

## 2012-08-07 NOTE — Patient Instructions (Signed)
We are going to add Lisinopril 5 mg a day for your blood pressure  We are going to check your kidney function today.  Monitor your blood pressure and write down your readings. Bring your cuff in for me to check at your next visit.  I will see you in a week.   Call the [facility] office at [phone] if you have any questions, problems or concerns.

## 2012-08-11 ENCOUNTER — Encounter: Payer: Self-pay | Admitting: Nurse Practitioner

## 2012-08-11 ENCOUNTER — Ambulatory Visit (INDEPENDENT_AMBULATORY_CARE_PROVIDER_SITE_OTHER): Payer: Medicare Other | Admitting: Nurse Practitioner

## 2012-08-11 VITALS — BP 130/74 | HR 60 | Ht 73.5 in | Wt 197.0 lb

## 2012-08-11 DIAGNOSIS — I10 Essential (primary) hypertension: Secondary | ICD-10-CM

## 2012-08-11 NOTE — Progress Notes (Signed)
Manuel [name] Date of Birth: 10-Jul-1922 Medical Record #[id number]  History of Present Illness: Manuel [name] is seen back today for a follow up visit. It is a 4 day check. He is seen for Dr. Elease Hashimoto. He has HTN, neuropathy, past Mallory-Weiss tear and no longer on aspirin therapy. Had been off of his HTN medicines since his GI bleed.   I saw him at the first of the week. BP was quite elevated. We restarted ACE inhibitor. He had been having some associated chest pain and headache.   He comes back today. He is here with his wife. He is in his scooter. He continues to maintain quite an active social schedule. He is feeling better. No more headache. No more chest pain. His BP cuff is way off in regards to correlation. He is tolerating his medicines.   Current Outpatient Prescriptions on File Prior to Visit  Medication Sig Dispense Refill  . beta carotene w/minerals (OCUVITE) tablet Take 1 tablet by mouth daily.      . cholecalciferol (VITAMIN D) 1000 UNITS tablet Take 2,000 Units by mouth daily.      . CO ENZYME Q-10 PO Take 1 tablet by mouth daily.      Marland Kitchen esomeprazole (NEXIUM) 20 MG capsule Take 20 mg by mouth daily before breakfast.      . FLAXSEED, LINSEED, PO Take 1 tablet by mouth daily.      Marland Kitchen lisinopril (PRINIVIL,ZESTRIL) 5 MG tablet Take 1 tablet (5 mg total) by mouth daily.  30 tablet  3  . multivitamin (THERAGRAN) per tablet Take 1 tablet by mouth daily.      Marland Kitchen oxyCODONE-acetaminophen (PERCOCET) 5-325 MG per tablet Take 1 tablet by mouth every 6 (six) hours as needed.      . ranitidine (ZANTAC) 300 MG tablet Take 300 mg by mouth as needed.         Allergies  Allergen Reactions  . Codeine Other (See Comments)    Pass out  . Ivp Dye (Iodinated Diagnostic Agents)   . Penicillins Itching  . Rofecoxib     REACTION: elevated BP  . Sulfonamide Derivatives Hives and Itching    Past Medical History  Diagnosis Date  . Hypertension   . Irregular heart beat   . Rectal bleed   .  Mallory-Weiss tear 2011    treated with 6 units PRBCs. No longer on aspirin    Past Surgical History  Procedure Date  . Back surgery   . Appendectomy   . Abdominal surgery   . Hernia repair     History  Smoking status  . Never Smoker   Smokeless tobacco  . Not on file    History  Alcohol Use No    History reviewed. No pertinent family history.  Review of Systems: The review of systems is per the HPI.  All other systems were reviewed and are negative.  Physical Exam: BP 130/74  Pulse 60  Ht 6' 1.5" (1.867 m)  Wt 197 lb (89.359 kg)  BMI 25.64 kg/m2 Patient is very pleasant and in no acute distress. He is obese. Skin is warm and dry. Color is normal.  HEENT is unremarkable. Normocephalic/atraumatic. PERRL. Sclera are nonicteric. Neck is supple. No masses. No JVD. Lungs are clear. Cardiac exam shows a regular rate and rhythm. Abdomen is soft. Extremities are without edema. Gait and ROM are intact. No gross neurologic deficits noted.  LABORATORY DATA:   Chemistry      Component Value Date/Time  NA 140 08/07/2012 1118   K 4.3 08/07/2012 1118   CL 106 08/07/2012 1118   CO2 26 08/07/2012 1118   BUN 24* 08/07/2012 1118   CREATININE 1.1 08/07/2012 1118      Component Value Date/Time   CALCIUM 9.2 08/07/2012 1118   ALKPHOS 56 03/10/2012 2103   AST 20 03/10/2012 2103   ALT 13 03/10/2012 2103   BILITOT 0.2* 03/10/2012 2103     Lab Results  Component Value Date   WBC 7.2 03/10/2012   HGB 13.4 03/10/2012   HCT 39.3 03/10/2012   MCV 86.2 03/10/2012   PLT 183 03/10/2012    Assessment / Plan: 1. HTN - Repeat BP by me today is down to 134/70. His machine is reading 168/70. He needs to get a new cuff. I have left him on his current regimen and not made any changes today. I will have Dr. Elease Hashimoto see him in a month. He will need a BMET on return.   2. Prior Mallory-Weiss tear - remains off aspirin therapy  3. Neuropathy - chronic  4. Fatigue - most likely due to physical  deconditioning.   We will see him back in 4 weeks. He is to get a new BP cuff and monitor at home. No changes in his medicines today. Would check a BMET on return.   Patient is agreeable to this plan and will call if any problems develop in the interim.

## 2012-08-11 NOTE — Patient Instructions (Signed)
Get a new BP cuff. I would suggest an Omron unit.  For now, stay on your current medicines  We will see you in a month.  Call the [facility] office at [phone] if you have any questions, problems or concerns.

## 2012-08-16 ENCOUNTER — Encounter: Payer: Self-pay | Admitting: Cardiovascular Disease

## 2012-08-17 ENCOUNTER — Encounter: Payer: Self-pay | Admitting: Cardiovascular Disease

## 2012-09-13 ENCOUNTER — Ambulatory Visit (INDEPENDENT_AMBULATORY_CARE_PROVIDER_SITE_OTHER): Payer: Medicare Other | Admitting: Cardiovascular Disease

## 2012-09-13 ENCOUNTER — Encounter: Payer: Self-pay | Admitting: Cardiovascular Disease

## 2012-09-13 VITALS — BP 144/70 | HR 61 | Ht 73.5 in | Wt 199.0 lb

## 2012-09-13 DIAGNOSIS — I10 Essential (primary) hypertension: Secondary | ICD-10-CM

## 2012-09-13 MED ORDER — LISINOPRIL 10 MG PO TABS: 10.0000 mg | ORAL_TABLET | Freq: Every day | ORAL | Status: DC

## 2012-09-13 NOTE — Patient Instructions (Addendum)
Your physician recommends that you schedule a follow-up appointment in: 3 MONTHS  Your physician recommends that you return for lab work in: 3 MONTHS // BMET  Your physician has recommended you make the following change in your medication:   INCREASE LISINOPRIL FOR 5 MG TO 10 MG DAILY FOR HIGH BLOOD PRESSURE

## 2012-09-13 NOTE — Progress Notes (Signed)
    Manuel [name] Date of Birth  [birth year]       [facility]    [place] [address]  [address] [place], [place]  [address]   [place], [place]  [address] [phone]     [phone]   Fax  [phone]    Fax [phone]  Problem List: 1. Hypertension 2. Mallory-Weiss tear 3. Peripheral neuropathy -   History of Present Illness:  Manuel [name] is an 78 yo who I have not seen in the past 2 years or so.  He has had some atypical chest pain but none recently.   We is now using a scooter to get around.  He is quite please with his Amigo scooter.   Current Outpatient Prescriptions on File Prior to Visit  Medication Sig Dispense Refill  . beta carotene w/minerals (OCUVITE) tablet Take 1 tablet by mouth daily.      . cholecalciferol (VITAMIN D) 1000 UNITS tablet Take 2,000 Units by mouth daily.      . CO ENZYME Q-10 PO Take 1 tablet by mouth daily.      Marland Kitchen FLAXSEED, LINSEED, PO Take 1 tablet by mouth daily.      Marland Kitchen lisinopril (PRINIVIL,ZESTRIL) 5 MG tablet Take 1 tablet (5 mg total) by mouth daily.  30 tablet  3  . multivitamin (THERAGRAN) per tablet Take 1 tablet by mouth daily.      Marland Kitchen oxyCODONE-acetaminophen (PERCOCET) 5-325 MG per tablet Take 1 tablet by mouth every 6 (six) hours as needed.      . ranitidine (ZANTAC) 300 MG tablet Take 300 mg by mouth as needed.         Allergies  Allergen Reactions  . Codeine Other (See Comments)    Pass out  . Ivp Dye (Iodinated Diagnostic Agents)   . Penicillins Itching  . Rofecoxib     REACTION: elevated BP  . Sulfonamide Derivatives Hives and Itching    Past Medical History  Diagnosis Date  . Hypertension   . Irregular heart beat   . Rectal bleed   . Mallory-Weiss tear 2011    treated with 6 units PRBCs. No longer on aspirin    Past Surgical History  Procedure Date  . Back surgery   . Appendectomy   . Abdominal surgery   . Hernia repair     History  Smoking status  . Never  Smoker   Smokeless tobacco  . Not on file    History  Alcohol Use No    No family history on file.  Reviw of Systems:  Reviewed in the HPI.  All other systems are negative.  Physical Exam: Blood pressure 144/70, pulse 61, height 6' 1.5" (1.867 m), weight 199 lb (90.266 kg), SpO2 95.00%. General: Well developed, well nourished, in no acute distress.  Head: Normocephalic, atraumatic, sclera non-icteric, mucus membranes are moist,   Neck: Supple. Carotids are 2 + without bruits. No JVD  Lungs: Clear bilaterally to auscultation.  Heart: regular rate.  normal  S1 S2. No murmurs, gallops or rubs.  Abdomen: Soft, non-tender, non-distended with normal bowel sounds. No hepatomegaly. No rebound/guarding. No masses.  Msk:  Strength and tone are normal  Extremities: No clubbing or cyanosis. No edema.  Distal pedal pulses are 2+ and equal bilaterally.  Neuro: Alert and oriented X 3. Moves all extremities spontaneously.  Psych:  Responds to questions appropriately with a normal affect.  ECG:  Assessment / Plan:

## 2012-09-13 NOTE — Assessment & Plan Note (Signed)
Manuel [name] is feeling quite well. He is now getting around in a scooter. His blood pressure is still a little bit elevated. His lisinopril was restarted by Lawson Fiscal last month. We'll increase his lisinopril to 10 mg today.  I see him back in 3 months for followup office visit and basic metabolic profile.

## 2012-12-01 ENCOUNTER — Other Ambulatory Visit: Payer: Self-pay | Admitting: *Deleted

## 2012-12-01 DIAGNOSIS — I6529 Occlusion and stenosis of unspecified carotid artery: Secondary | ICD-10-CM

## 2012-12-09 ENCOUNTER — Other Ambulatory Visit: Payer: Self-pay

## 2012-12-12 ENCOUNTER — Encounter: Payer: Self-pay | Admitting: Vascular Surgery

## 2012-12-13 ENCOUNTER — Ambulatory Visit: Payer: Medicare Other | Admitting: Cardiovascular Disease

## 2012-12-13 ENCOUNTER — Other Ambulatory Visit: Payer: Medicare Other

## 2012-12-13 ENCOUNTER — Ambulatory Visit: Payer: Medicare Other | Admitting: Neurosurgery

## 2012-12-13 ENCOUNTER — Other Ambulatory Visit (INDEPENDENT_AMBULATORY_CARE_PROVIDER_SITE_OTHER): Payer: Medicare Other | Admitting: *Deleted

## 2012-12-13 ENCOUNTER — Encounter: Payer: Self-pay | Admitting: Neurosurgery

## 2012-12-13 VITALS — BP 174/77 | HR 63 | Resp 16 | Ht 73.0 in | Wt 200.0 lb

## 2012-12-13 DIAGNOSIS — I6529 Occlusion and stenosis of unspecified carotid artery: Secondary | ICD-10-CM

## 2012-12-13 NOTE — Progress Notes (Unsigned)
[name] Office Note  CC: [name] surveillance Referring Physician: Edilia Bo  History of Present Illness: 79 year old male patient of Dr. Edilia Bo followed for known [name] stenosis. The patient denies any signs or symptoms of CVA, TIA, amaurosis fugax. The patient denies any new medical diagnoses or recent surgery.  Past Medical History  Diagnosis Date  . Hypertension   . Irregular heart beat   . Rectal bleed   . Mallory-Weiss tear 2011    treated with 6 units PRBCs. No longer on aspirin  . [name] artery occlusion     ROS: [x]  Positive   [ ]  Denies    General: [ ]  Weight loss, [ ]  Fever, [ ]  chills Neurologic: [ ]  Dizziness, [ ]  Blackouts, [ ]  Seizure [ ]  Stroke, [ ]  "Mini stroke", [ ]  Slurred speech, [ ]  Temporary blindness; [ ]  weakness in arms or legs, [ ]  Hoarseness Cardiac: [ ]  Chest pain/pressure, [ ]  Shortness of breath at rest [ ]  Shortness of breath with exertion, [ ]  Atrial fibrillation or irregular heartbeat Vascular: [ ]  Pain in legs with walking, [ ]  Pain in legs at rest, [ ]  Pain in legs at night,  [ ]  Non-healing ulcer, [ ]  Blood clot in vein/DVT,   Pulmonary: [ ]  Home oxygen, [ ]  Productive cough, [ ]  Coughing up blood, [ ]  Asthma,  [ ]  Wheezing Musculoskeletal:  [ ]  Arthritis, [ ]  Low back pain, [ ]  Joint pain Hematologic: [ ]  Easy Bruising, [ ]  Anemia; [ ]  Hepatitis Gastrointestinal: [ ]  Blood in stool, [ ]  Gastroesophageal Reflux/heartburn, [ ]  Trouble swallowing Urinary: [ ]  chronic Kidney disease, [ ]  on HD - [ ]  MWF or [ ]  TTHS, [ ]  Burning with urination, [ ]  Difficulty urinating Skin: [ ]  Rashes, [ ]  Wounds Psychological: [ ]  Anxiety, [ ]  Depression   Social History History  Substance Use Topics  . Smoking status: Never Smoker   . Smokeless tobacco: Not on file  . Alcohol Use: No    Family History No family history on file.  Allergies  Allergen Reactions  . Codeine Other (See Comments)    Pass out  .  Ivp Dye (Iodinated Diagnostic Agents)   . Penicillins Itching  . Rofecoxib     REACTION: elevated BP  . Sulfonamide Derivatives Hives and Itching    Current Outpatient Prescriptions  Medication Sig Dispense Refill  . beta carotene w/minerals (OCUVITE) tablet Take 1 tablet by mouth daily.      . cholecalciferol (VITAMIN D) 1000 UNITS tablet Take 2,000 Units by mouth daily.      . CO ENZYME Q-10 PO Take 1 tablet by mouth daily.      Marland Kitchen FLAXSEED, LINSEED, PO Take 1 tablet by mouth daily.      Marland Kitchen lisinopril (PRINIVIL,ZESTRIL) 10 MG tablet Take 1 tablet (10 mg total) by mouth daily.  30 tablet  11  . multivitamin (THERAGRAN) per tablet Take 1 tablet by mouth daily.      Marland Kitchen oxyCODONE-acetaminophen (PERCOCET) 5-325 MG per tablet Take 1 tablet by mouth every 6 (six) hours as needed.      . ranitidine (ZANTAC) 300 MG tablet Take 300 mg by mouth as needed.        No current facility-administered medications for this visit.    Physical Examination  Filed Vitals:   12/13/12 1556  BP: 174/77  Pulse: 63  Resp:     Body mass index is 26.39 kg/(m^2).  General:  WDWN in NAD Gait: Normal HEENT: WNL Eyes: Pupils equal Pulmonary: normal non-labored breathing , without Rales, rhonchi,  wheezing Cardiac: RRR, without  Murmurs, rubs or gallops; Abdomen: soft, NT, no masses Skin: no rashes, ulcers noted  Vascular Exam Pulses: 2+ radial pulses bilaterally [name] bruits: [name] pulses to auscultation no bruits are heard Extremities without ischemic changes, no Gangrene , no cellulitis; no open wounds;  Musculoskeletal: no muscle wasting or atrophy   Neurologic: A&O X 3; Appropriate Affect ; SENSATION: normal; MOTOR FUNCTION:  moving all extremities equally. Speech is fluent/normal  Non-Invasive Vascular Imaging [name] DUPLEX 12/13/2012  Right ICA 20 - 39 % stenosis Left ICA 20 - 39 % stenosis   ASSESSMENT/PLAN: Asymptomatic patient with a downgraded right ICA stenosis from 40-59%,  previous exam February 2013. The patient will followup in one year with repeat [name] duplex. The patient's questions were encouraged and answered, he is in agreement with this plan.  Lauree Chandler ANP   Clinic MD: Edilia Bo

## 2013-01-23 ENCOUNTER — Ambulatory Visit (INDEPENDENT_AMBULATORY_CARE_PROVIDER_SITE_OTHER): Payer: Medicare Other | Admitting: Cardiovascular Disease

## 2013-01-23 ENCOUNTER — Encounter: Payer: Self-pay | Admitting: Cardiovascular Disease

## 2013-01-23 ENCOUNTER — Other Ambulatory Visit (INDEPENDENT_AMBULATORY_CARE_PROVIDER_SITE_OTHER): Payer: Medicare Other

## 2013-01-23 VITALS — BP 144/74 | HR 67 | Ht 73.0 in | Wt 193.1 lb

## 2013-01-23 DIAGNOSIS — I2510 Atherosclerotic heart disease of native coronary artery without angina pectoris: Secondary | ICD-10-CM

## 2013-01-23 DIAGNOSIS — I10 Essential (primary) hypertension: Secondary | ICD-10-CM

## 2013-01-23 DIAGNOSIS — R5383 Other fatigue: Secondary | ICD-10-CM

## 2013-01-23 DIAGNOSIS — R5381 Other malaise: Secondary | ICD-10-CM

## 2013-01-23 NOTE — Assessment & Plan Note (Signed)
His BP is ok.  He is having some fatigue.  He thinks it may be due to his BP meds.  He is only on Lisinopril -- I doubt that this is causing his fatigue.  Will check BMP, CBC, TSH.  I have advised him to see Dr. Tiburcio Pea.

## 2013-01-23 NOTE — Patient Instructions (Signed)
Your physician wants you to follow-up in: 6 MONTHS You will receive a reminder letter in the mail two months in advance. If you don't receive a letter, please call our office to schedule the follow-up appointment.  Your physician recommends that you return for lab work in: TODAY, TSH, BMET, CBC  Your physician recommends that you continue on your current medications as directed. Please refer to the Current Medication list given to you today.  MAY HOLD LISINOPRIL OR CUT IN HALF FOR ONE WEEK TO SEE IF HELPS ENERGY LEVEL, CALL AND UPDATE [place] IF THIS IS THE PROBLEM,

## 2013-01-23 NOTE — Progress Notes (Signed)
Manuel [name] Date of Birth  October 28, 1921       [facility]    [place] [address]  [address] [place], [place]  [address]   [place], [place]  [address] [phone]     [phone]   Fax  [phone]    Fax [phone]  Problem List: 1. Hypertension 2. Mallory-Weiss tear 3. Peripheral neuropathy -   History of Present Illness:  Manuel [name] is an 80 yo who I have not seen in the past 2 years or so.  He has had some atypical chest pain but none recently.   We is now using a scooter to get around.  He is quite please with his Amigo scooter.   January 23, 2013:   Manuel [name] complains of generalized fatigue.  He is sleepy.  He checks his BP frequently.   Current Outpatient Prescriptions on File Prior to Visit  Medication Sig Dispense Refill  . beta carotene w/minerals (OCUVITE) tablet Take 1 tablet by mouth daily.      . cholecalciferol (VITAMIN D) 1000 UNITS tablet Take 2,000 Units by mouth daily.      . CO ENZYME Q-10 PO Take 1 tablet by mouth daily.      Marland Kitchen FLAXSEED, LINSEED, PO Take 1 tablet by mouth daily.      Marland Kitchen lisinopril (PRINIVIL,ZESTRIL) 10 MG tablet Take 1 tablet (10 mg total) by mouth daily.  30 tablet  11  . multivitamin (THERAGRAN) per tablet Take 2 tablets by mouth daily.        No current facility-administered medications on file prior to visit.    Allergies  Allergen Reactions  . Codeine Other (See Comments)    Pass out  . Ivp Dye (Iodinated Diagnostic Agents)   . Penicillins Itching  . Rofecoxib     REACTION: elevated BP  . Sulfonamide Derivatives Hives and Itching    Past Medical History  Diagnosis Date  . Hypertension   . Irregular heart beat   . Rectal bleed   . Mallory-Weiss tear 2011    treated with 6 units PRBCs. No longer on aspirin  . Carotid artery occlusion   . Zenker diverticulum   . Colon polyps   . Gastric hemorrhage   . CAD (coronary artery disease)   . Hearing loss   . Peripheral  neuropathy     Past Surgical History  Procedure Laterality Date  . Back surgery    . Appendectomy    . Abdominal surgery    . Hernia repair      History  Smoking status  . Never Smoker   Smokeless tobacco  . Never Used    History  Alcohol Use No    No family history on file.  Reviw of Systems:  Reviewed in the HPI.  All other systems are negative.  Physical Exam: Blood pressure 144/74, pulse 67, height 6\' 1"  (1.854 m), weight 193 lb 1.9 oz (87.599 kg), SpO2 96.00%. General: Well developed, well nourished, in no acute distress.  Head: Normocephalic, atraumatic, sclera non-icteric, mucus membranes are moist,   Neck: Supple. Carotids are 2 + without bruits. No JVD  Lungs: Clear bilaterally to auscultation.  Heart: regular rate.  normal  S1 S2. No murmurs, gallops or rubs.  Abdomen: Soft, non-tender, non-distended with normal bowel sounds. No hepatomegaly. No rebound/guarding. No masses.  Msk:  Strength and tone are normal  Extremities: No clubbing or cyanosis. No edema.  Distal pedal pulses are  2+ and equal bilaterally.  Neuro: Alert and oriented X 3. Moves all extremities spontaneously.  Psych:  Responds to questions appropriately with a normal affect.  ECG: January 23, 2013:  NSR at 65  Normal ECG Assessment / Plan:

## 2013-01-23 NOTE — Assessment & Plan Note (Signed)
Stable

## 2013-01-24 LAB — BASIC METABOLIC PANEL
CO2: 30 mEq/L (ref 19–32)
Chloride: 107 mEq/L (ref 96–112)
Potassium: 4.7 mEq/L (ref 3.5–5.1)
Sodium: 141 mEq/L (ref 135–145)

## 2013-05-30 ENCOUNTER — Other Ambulatory Visit: Payer: Self-pay

## 2013-08-09 ENCOUNTER — Emergency Department (HOSPITAL_COMMUNITY)
Admission: EM | Admit: 2013-08-09 | Discharge: 2013-08-10 | Disposition: A | Discharge status: Home / Self Care | Payer: Medicare Other | Attending: Emergency Medicine | Admitting: Emergency Medicine

## 2013-08-09 ENCOUNTER — Other Ambulatory Visit: Payer: Self-pay

## 2013-08-09 ENCOUNTER — Encounter (HOSPITAL_COMMUNITY): Payer: Self-pay | Admitting: Emergency Medicine

## 2013-08-09 ENCOUNTER — Emergency Department (HOSPITAL_COMMUNITY): Payer: Medicare Other

## 2013-08-09 DIAGNOSIS — I10 Essential (primary) hypertension: Secondary | ICD-10-CM | POA: Insufficient documentation

## 2013-08-09 DIAGNOSIS — M79602 Pain in left arm: Secondary | ICD-10-CM

## 2013-08-09 DIAGNOSIS — Z860100 Personal history of colon polyps, unspecified: Secondary | ICD-10-CM | POA: Insufficient documentation

## 2013-08-09 DIAGNOSIS — Z8719 Personal history of other diseases of the digestive system: Secondary | ICD-10-CM | POA: Insufficient documentation

## 2013-08-09 DIAGNOSIS — I2510 Atherosclerotic heart disease of native coronary artery without angina pectoris: Secondary | ICD-10-CM | POA: Insufficient documentation

## 2013-08-09 DIAGNOSIS — Z880 Allergy status to penicillin: Secondary | ICD-10-CM | POA: Insufficient documentation

## 2013-08-09 DIAGNOSIS — Z8669 Personal history of other diseases of the nervous system and sense organs: Secondary | ICD-10-CM | POA: Insufficient documentation

## 2013-08-09 DIAGNOSIS — H9190 Unspecified hearing loss, unspecified ear: Secondary | ICD-10-CM | POA: Insufficient documentation

## 2013-08-09 DIAGNOSIS — Z79899 Other long term (current) drug therapy: Secondary | ICD-10-CM | POA: Insufficient documentation

## 2013-08-09 DIAGNOSIS — M79609 Pain in unspecified limb: Secondary | ICD-10-CM | POA: Insufficient documentation

## 2013-08-09 LAB — D-DIMER, QUANTITATIVE (NOT AT ARMC): D-Dimer, Quant: 0.67 ug/mL-FEU — ABNORMAL HIGH (ref 0.00–0.48)

## 2013-08-09 LAB — CBC WITH DIFFERENTIAL/PLATELET
Eosinophils Absolute: 0.2 10*3/uL (ref 0.0–0.7)
Eosinophils Relative: 2 % (ref 0–5)
HCT: 39.2 % (ref 39.0–52.0)
Hemoglobin: 13.3 g/dL (ref 13.0–17.0)
Lymphs Abs: 2 10*3/uL (ref 0.7–4.0)
MCH: 29.4 pg (ref 26.0–34.0)
MCHC: 33.9 g/dL (ref 30.0–36.0)
MCV: 86.5 fL (ref 78.0–100.0)
Monocytes Absolute: 1 10*3/uL (ref 0.1–1.0)
Monocytes Relative: 13 % — ABNORMAL HIGH (ref 3–12)
Neutro Abs: 4.6 10*3/uL (ref 1.7–7.7)
Platelets: 210 10*3/uL (ref 150–400)
RBC: 4.53 MIL/uL (ref 4.22–5.81)
WBC: 7.8 10*3/uL (ref 4.0–10.5)

## 2013-08-09 LAB — BASIC METABOLIC PANEL
CO2: 26 mEq/L (ref 19–32)
Calcium: 8.7 mg/dL (ref 8.4–10.5)
Glucose, Bld: 105 mg/dL — ABNORMAL HIGH (ref 70–99)
Sodium: 138 mEq/L (ref 135–145)

## 2013-08-09 MED ORDER — ENOXAPARIN SODIUM 100 MG/ML ~~LOC~~ SOLN
90.0000 mg | Freq: Once | SUBCUTANEOUS | Status: AC
  Administered 2013-08-09: 90 mg via SUBCUTANEOUS
  Filled 2013-08-09 (×3): qty 0.9

## 2013-08-09 MED ORDER — ACETAMINOPHEN 325 MG PO TABS
650.0000 mg | ORAL_TABLET | Freq: Once | ORAL | Status: AC
  Administered 2013-08-09: 650 mg via ORAL
  Filled 2013-08-09: qty 2

## 2013-08-09 NOTE — ED Notes (Signed)
Pt was sent here by Dr Azucena Cecil for r/o blood clot or torn ligament for L arm pain.  Pt states he was walking when he began experiencing L arm upper arm pain.  Pt has full rom, but pain increases with movement.  Denies sob, chest pain or nausea.

## 2013-08-09 NOTE — ED Provider Notes (Signed)
CSN: [id number]     Arrival date & time 08/09/13  1835 History   First MD Initiated Contact with Patient 08/09/13 1944     Chief Complaint  Patient presents with  . Arm Pain   (Consider location/radiation/quality/duration/timing/severity/associated sxs/prior Treatment) HPI  This is an 79 year old male who presents with acute onset of left arm pain yesterday. Patient reports he had onset of arm pain yesterday afternoon. He denies any injury. He states it has progressively gotten worse. It is worse with movement. Patient was told by his primary care doctor to present to the ER.  Patient has limited range of motion of the shoulders. He denies chest pain, shortness of breath, or nausea.  Past Medical History  Diagnosis Date  . Hypertension   . Irregular heart beat   . Rectal bleed   . Mallory-Weiss tear 2011    treated with 6 units PRBCs. No longer on aspirin  . Carotid artery occlusion   . Zenker diverticulum   . Colon polyps   . Gastric hemorrhage   . CAD (coronary artery disease)   . Hearing loss   . Peripheral neuropathy    Past Surgical History  Procedure Laterality Date  . Back surgery    . Appendectomy    . Abdominal surgery    . Hernia repair     No family history on file. History  Substance Use Topics  . Smoking status: Never Smoker   . Smokeless tobacco: Never Used  . Alcohol Use: No    Review of Systems  Constitutional: Negative.   Respiratory: Negative.  Negative for chest tightness and shortness of breath.   Cardiovascular: Negative.  Negative for chest pain.  Gastrointestinal: Negative.  Negative for abdominal pain.  Genitourinary: Negative.   Musculoskeletal:       Left upper arm pain  Skin: Negative for wound.  All other systems reviewed and are negative.    Allergies  Codeine; Ivp dye; Penicillins; Rofecoxib; and Sulfonamide derivatives  Home Medications   Current Outpatient Rx  Name  Route  Sig  Dispense  Refill  . b complex vitamins  tablet   Oral   Take 1 tablet by mouth daily.         Marland Kitchen CALCIUM-MAGNESIUM-VITAMIN D PO   Oral   Take 1 tablet by mouth daily as needed (constipation).         . Cholecalciferol (VITAMIN D) 2000 UNITS tablet   Oral   Take 2,000 Units by mouth daily.         . Coenzyme Q10 (COQ10) 200 MG CAPS   Oral   Take 200 mg by mouth daily.         Marland Kitchen lisinopril (PRINIVIL,ZESTRIL) 10 MG tablet   Oral   Take 10 mg by mouth daily as needed (if blood pressure is 160/80 or higher).         . Multiple Vitamin (MULTIVITAMIN WITH MINERALS) TABS tablet   Oral   Take 1 tablet by mouth 2 (two) times daily. NutraLite         . OMEGA 3-6-9 FATTY ACIDS PO   Oral   Take 1 capsule by mouth daily.         . sucralfate (CARAFATE) 1 G tablet   Oral   Take 1 g by mouth 2 (two) times daily.          . Thiamine HCl (VITAMIN B-1) 250 MG tablet   Oral   Take 250 mg by mouth daily.         Marland Kitchen  vitamin C (ASCORBIC ACID) 500 MG tablet   Oral   Take 250-500 mg by mouth 2 (two) times daily. Take 1/2 tablet (250 mg) every morning and 1 tablet (500 mg) every evening         . VITAMIN E PO   Oral   Take 1 capsule by mouth daily.          BP 165/67  Pulse 64  Temp(Src) 97.7 F (36.5 C) (Oral)  Resp 18  SpO2 96% Physical Exam  Nursing note and vitals reviewed. Constitutional: He is oriented to person, place, and time. He appears well-developed and well-nourished. No distress.  Elderly  HENT:  Head: Normocephalic and atraumatic.  Cardiovascular: Normal rate, regular rhythm and normal heart sounds.   No murmur heard. Pulmonary/Chest: Effort normal and breath sounds normal. No respiratory distress. He has no wheezes.  Abdominal: Soft. There is no tenderness.  Musculoskeletal: He exhibits no edema.  Tenderness to palpation over the middle and distal humerus, no obvious deformity, full range of motion at the elbow, limited abduction of the shoulder secondary to pain. Neurovascularly  intact distally  Lymphadenopathy:    He has no cervical adenopathy.  Neurological: He is alert and oriented to person, place, and time.  Skin: Skin is warm and dry.  Psychiatric: He has a normal mood and affect.    ED Course  Procedures (including critical care time) Labs Review Labs Reviewed  CBC WITH DIFFERENTIAL - Abnormal; Notable for the following:    Monocytes Relative 13 (*)    All other components within normal limits  BASIC METABOLIC PANEL - Abnormal; Notable for the following:    Glucose, Bld 105 (*)    GFR calc non Af Amer 73 (*)    GFR calc Af Amer 85 (*)    All other components within normal limits  D-DIMER, QUANTITATIVE - Abnormal; Notable for the following:    D-Dimer, Quant 0.67 (*)    All other components within normal limits   Imaging Review Dg Humerus Left  08/09/2013   CLINICAL DATA:  Worsening pain since yesterday. No injury.  EXAM: LEFT HUMERUS - 2+ VIEW  COMPARISON:  None.  FINDINGS: There is no evidence of fracture or other focal bone lesions. The faint calcification along the lower glenoid margin could represent a small loose body. There is mild degenerative insertional changes of the supracondylar distal humerus. Soft tissues are unremarkable.  IMPRESSION: Negative.   Electronically Signed   By: Tiburcio Pea M.D.   On: 08/09/2013 21:09    EKG Interpretation   None      EKG independently reviewed by myself: Normal sinus rhythm with a rate of 73, PVCs noted, no evidence of acute ischemia MDM   1. Left arm pain      This is a 79 year old male who presents with left arm pain. He is tender to palpation over the mid humerus without evidence of contusion or deformity. Pain is worse with range of motion suggesting musculoskeletal origin. His primary care doctor was concerned for blood clots. Patient is low risk. D-dimer was sent. Basic labwork was also obtained because of patient's initial blood pressure of 204/66. He has no chest pain or headache at  this time. Workup is within normal limits. Plain films of the left arm are negative for acute fracture. Patient had a mildly positive d-dimer at 0.67. Cannot get a ultrasound at this hour. Patient will be given a Lovenox shot and will return tomorrow for ultrasound.  After  history, exam, and medical workup I feel the patient has been appropriately medically screened and is safe for discharge home. Pertinent diagnoses were discussed with the patient. Patient was given return precautions.    Shon Baton, MD 08/10/13 [phone]

## 2013-08-10 ENCOUNTER — Ambulatory Visit (HOSPITAL_COMMUNITY)
Admission: RE | Admit: 2013-08-10 | Discharge: 2013-08-10 | Disposition: A | Discharge status: Home / Self Care | Payer: Medicare Other | Source: Ambulatory Visit | Attending: Emergency Medicine | Admitting: Emergency Medicine

## 2013-08-10 DIAGNOSIS — M79609 Pain in unspecified limb: Secondary | ICD-10-CM | POA: Insufficient documentation

## 2013-08-10 NOTE — Progress Notes (Signed)
VASCULAR LAB PRELIMINARY  PRELIMINARY  PRELIMINARY  PRELIMINARY  Left upper extremity venous duplex completed.    Preliminary report:  Left :  No evidence of DVT or superficial thrombosis.    Gwendloyn Forsee, RVS 08/10/2013, 3:24 PM

## 2013-08-30 ENCOUNTER — Other Ambulatory Visit: Payer: Self-pay

## 2013-10-09 ENCOUNTER — Other Ambulatory Visit: Payer: Self-pay | Admitting: Cardiovascular Disease

## 2013-12-10 ENCOUNTER — Ambulatory Visit: Payer: Medicare Other | Admitting: Cardiovascular Disease

## 2013-12-10 ENCOUNTER — Encounter: Payer: Self-pay | Admitting: Family

## 2013-12-11 ENCOUNTER — Encounter: Payer: Self-pay | Admitting: Family

## 2013-12-12 ENCOUNTER — Other Ambulatory Visit: Payer: Medicare Other

## 2013-12-12 ENCOUNTER — Other Ambulatory Visit (HOSPITAL_COMMUNITY): Payer: Medicare Other

## 2013-12-12 ENCOUNTER — Encounter: Payer: Self-pay | Admitting: Family

## 2013-12-12 ENCOUNTER — Ambulatory Visit: Payer: Medicare Other | Admitting: Family

## 2013-12-12 ENCOUNTER — Ambulatory Visit (HOSPITAL_COMMUNITY)
Admission: RE | Admit: 2013-12-12 | Discharge: 2013-12-12 | Disposition: A | Discharge status: Home / Self Care | Payer: Medicare Other | Source: Ambulatory Visit | Attending: Family | Admitting: Family

## 2013-12-12 ENCOUNTER — Telehealth: Payer: Self-pay | Admitting: Cardiovascular Disease

## 2013-12-12 ENCOUNTER — Ambulatory Visit (INDEPENDENT_AMBULATORY_CARE_PROVIDER_SITE_OTHER): Payer: Medicare Other | Admitting: Family

## 2013-12-12 VITALS — BP 180/86 | HR 69 | Resp 16 | Ht 73.0 in | Wt 192.0 lb

## 2013-12-12 DIAGNOSIS — I6529 Occlusion and stenosis of unspecified carotid artery: Secondary | ICD-10-CM

## 2013-12-12 DIAGNOSIS — I658 Occlusion and stenosis of other precerebral arteries: Secondary | ICD-10-CM | POA: Insufficient documentation

## 2013-12-12 NOTE — Telephone Encounter (Signed)
New message     bp reading is 156/80 at 3:10 pm prior to that 190/83 at 2:00.  Wife gave him another lisinopril at 2:05.  Wife want to talk to a nurse

## 2013-12-12 NOTE — Patient Instructions (Addendum)
Stroke Prevention Some medical conditions and behaviors are associated with an increased chance of having a stroke. You may prevent a stroke by making healthy choices and managing medical conditions. HOW CAN I REDUCE MY RISK OF HAVING A STROKE?   Stay physically active. Get at least 30 minutes of activity on most or all days.  Do not smoke. It may also be helpful to avoid exposure to secondhand smoke.  Limit alcohol use. Moderate alcohol use is considered to be:  No more than 2 drinks per day for men.  No more than 1 drink per day for nonpregnant women.  Eat healthy foods. This involves  Eating 5 or more servings of fruits and vegetables a day.  Following a diet that addresses high blood pressure (hypertension), high cholesterol, diabetes, or obesity.  Manage your cholesterol levels.  A diet low in saturated fat, trans fat, and cholesterol and high in fiber may control cholesterol levels.  Take any prescribed medicines to control cholesterol as directed by your health care provider.  Manage your diabetes.  A controlled-carbohydrate, controlled-sugar diet is recommended to manage diabetes.  Take any prescribed medicines to control diabetes as directed by your health care provider.  Control your hypertension.  A low-salt (sodium), low-saturated fat, low-trans fat, and low-cholesterol diet is recommended to manage hypertension.  Take any prescribed medicines to control hypertension as directed by your health care provider.  Maintain a healthy weight.  A reduced-calorie, low-sodium, low-saturated fat, low-trans fat, low-cholesterol diet is recommended to manage weight.  Stop drug abuse.  Avoid taking birth control pills.  Talk to your health care provider about the risks of taking birth control pills if you are over 8 years old, smoke, get migraines, or have ever had a blood clot.  Get evaluated for sleep disorders (sleep apnea).  Talk to your health care provider about  getting a sleep evaluation if you snore a lot or have excessive sleepiness.  Take medicines as directed by your health care provider.  For some people, aspirin or blood thinners (anticoagulants) are helpful in reducing the risk of forming abnormal blood clots that can lead to stroke. If you have the irregular heart rhythm of atrial fibrillation, you should be on a blood thinner unless there is a good reason you cannot take them.  Understand all your medicine instructions.  Make sure that other other conditions (such as anemia or atherosclerosis) are addressed. SEEK IMMEDIATE MEDICAL CARE IF:   You have sudden weakness or numbness of the face, arm, or leg, especially on one side of the body.  Your face or eyelid droops to one side.  You have sudden confusion.  You have trouble speaking (aphasia) or understanding.  You have sudden trouble seeing in one or both eyes.  You have sudden trouble walking.  You have dizziness.  You have a loss of balance or coordination.  You have a sudden, severe headache with no known cause.  You have new chest pain or an irregular heartbeat. Any of these symptoms may represent a serious problem that is an emergency. Do not wait to see if the symptoms will go away. Get medical help at once. Call your local emergency services  (911 in [place] Do not drive yourself to the hospital. Document Released: 11/18/2004 Document Revised: 08/01/2013 Document Reviewed: 04/13/2013 [facility] Patient Information 2014 [place].   Venous Stasis or Chronic Venous Insufficiency Chronic venous insufficiency, also called venous stasis, is a condition that affects the veins in the legs. The condition  prevents blood from being pumped through these veins effectively. Blood may no longer be pumped effectively from the legs back to the heart. This condition can range from mild to severe. With proper treatment, you should be able to continue with an active life. CAUSES   Chronic venous insufficiency occurs when the vein walls become stretched, weakened, or damaged or when valves within the vein are damaged. Some common causes of this include:  High blood pressure inside the veins (venous hypertension).  Increased blood pressure in the leg veins from long periods of sitting or standing.  A blood clot that blocks blood flow in a vein (deep vein thrombosis).  Inflammation of a superficial vein (phlebitis) that causes a blood clot to form. RISK FACTORS Various things can make you more likely to develop chronic venous insufficiency, including:  Family history of this condition.  Obesity.  Pregnancy.  Sedentary lifestyle.  Smoking.  Jobs requiring long periods of standing or sitting in one place.  Being a certain age. Women in their 64s and 73s and men in their 84s are more likely to develop this condition. SIGNS AND SYMPTOMS  Symptoms may include:   Varicose veins.  Skin breakdown or ulcers.  Reddened or discolored skin on the leg.  Brown, smooth, tight, and painful skin just above the ankle, usually on the inside surface (lipodermatosclerosis).  Swelling. DIAGNOSIS  To diagnose this condition, your health care provider will take a medical history and do a physical exam. The following tests may be ordered to confirm the diagnosis:  Duplex ultrasound A procedure that produces a picture of a blood vessel and nearby organs and also provides information on blood flow through the blood vessel.  Plethysmography A procedure that tests blood flow.  A venogram, or venography A procedure used to look at the veins using X-ray and dye. TREATMENT The goals of treatment are to help you return to an active life and to minimize pain or disability. Treatment will depend on the severity of the condition. Medical procedures may be needed for severe cases. Treatment options may include:   Use of compression stockings. These can help with symptoms and lower  the chances of the problem getting worse, but they do not cure the problem.  Sclerotherapy A procedure involving an injection of a material that "dissolves" the damaged veins. Other veins in the network of blood vessels take over the function of the damaged veins.  Surgery to remove the vein or cut off blood flow through the vein (vein stripping or laser ablation surgery).  Surgery to repair a valve. HOME CARE INSTRUCTIONS   Wear compression stockings as directed by your health care provider.  Only take over-the-counter or prescription medicines for pain, discomfort, or fever as directed by your health care provider.  Follow up with your health care provider as directed. SEEK MEDICAL CARE IF:   You have redness, swelling, or increasing pain in the affected area.  You see a red streak or line that extends up or down from the affected area.  You have a breakdown or loss of skin in the affected area, even if the breakdown is small.  You have an injury to the affected area. SEEK IMMEDIATE MEDICAL CARE IF:   You have an injury and open wound in the affected area.  Your pain is severe and does not improve with medicine.  You have sudden numbness or weakness in the foot or ankle below the affected area, or you have trouble moving your foot  or ankle.  You have a fever or persistent symptoms for more than 2 3 days.  You have a fever and your symptoms suddenly get worse. MAKE SURE YOU:   Understand these instructions.  Will watch your condition.  Will get help right away if you are not doing well or get worse. Document Released: 02/14/2007 Document Revised: 08/01/2013 Document Reviewed: 06/18/2013 [facility] Patient Information 2014 [place].   Put on 20-30 mm Hg knee high compression hose in the morning, remove at night. Elevate legs slightly above heart level when not walking.

## 2013-12-12 NOTE — Progress Notes (Signed)
Established Carotid Patient   History of Present Illness  Manuel [name] is a 80 y.o. male patient of Dr. Scot Dock who has known carotid stenosis. He returns today for follow up.  Patient has not had previous carotid artery intervention.  Sees Dr. Manley Mason next week, his cardiologist. He denies ever having a stroke or MI. He fell off scaffolding while in the [employer], landed on buttocks, later lumbar spine issues and neuropathy in feet developed, along with left foot drop. He has had what sounds like EMG and was then diagnosed with peripheral neuropathy. He is discourage by his inability to walk very far before low back and legs pain start.  Patient has Negative history of TIA or stroke symptom.  The patient denies amaurosis fugax or monocular blindness.  The patient  denies facial drooping.  Pt. denies hemiplegia.  The patient denies receptive or expressive aphasia.  Pt. denies extremity weakness.   Patient denies New Medical or Surgical History.  Pt Diabetic: No Pt smoker: non-smoker  Pt meds include: Statin : No: has never had elevated cholesterol ASA: No: had GI bleed and was stopped Other anticoagulants/antiplatelets: no   Past Medical History  Diagnosis Date  . Hypertension   . Irregular heart beat   . Rectal bleed   . Mallory-Weiss tear 2011    treated with 6 units PRBCs. No longer on aspirin  . Carotid artery occlusion   . Zenker diverticulum   . Colon polyps   . Gastric hemorrhage   . CAD (coronary artery disease)   . Hearing loss   . Peripheral neuropathy     Social History History  Substance Use Topics  . Smoking status: Never Smoker   . Smokeless tobacco: Never Used  . Alcohol Use: No    Family History Family History  Problem Relation Age of Onset  . Cancer Mother   . Heart attack Father   . Heart disease Sister     Heart Disease before age 48  . Diabetes Son   . Parkinson's disease Sister     Surgical History Past Surgical History  Procedure  Laterality Date  . Back surgery    . Appendectomy    . Abdominal surgery    . Hernia repair      Allergies  Allergen Reactions  . Codeine Other (See Comments)    Pass out  . Ivp Dye [Iodinated Diagnostic Agents] Other (See Comments)    Doesn't remember reaction but it was severe  . Penicillins Itching  . Rofecoxib Other (See Comments)     elevated BP (Vioxx)  . Sulfonamide Derivatives Hives and Itching    Current Outpatient Prescriptions  Medication Sig Dispense Refill  . b complex vitamins tablet Take 1 tablet by mouth daily.      Marland Kitchen CALCIUM-MAGNESIUM-VITAMIN D PO Take 1 tablet by mouth daily as needed (constipation).      . Cholecalciferol (VITAMIN D) 2000 UNITS tablet Take 2,000 Units by mouth daily.      . Coenzyme Q10 (COQ10) 200 MG CAPS Take 200 mg by mouth daily.      Marland Kitchen lisinopril (PRINIVIL,ZESTRIL) 10 MG tablet TAKE 1 TABLET BY MOUTH DAILY.  30 tablet  0  . Multiple Vitamin (MULTIVITAMIN WITH MINERALS) TABS tablet Take 1 tablet by mouth 2 (two) times daily. NutraLite      . OMEGA 3-6-9 FATTY ACIDS PO Take 1 capsule by mouth daily.      . sucralfate (CARAFATE) 1 G tablet Take 1 g by mouth  2 (two) times daily.       . Thiamine HCl (VITAMIN B-1) 250 MG tablet Take 250 mg by mouth daily.      . vitamin C (ASCORBIC ACID) 500 MG tablet Take 250-500 mg by mouth 2 (two) times daily. Take 1/2 tablet (250 mg) every morning and 1 tablet (500 mg) every evening      . VITAMIN E PO Take 1 capsule by mouth daily.       No current facility-administered medications for this visit.    Review of Systems : See HPI for pertinent positives and negatives.  Physical Examination  Filed Vitals:   12/12/13 1218  BP: 180/86  Pulse: 69  Resp: 16   Filed Weights   12/12/13 1218  Weight: 192 lb (87.091 kg)   Body mass index is 25.34 kg/(m^2).  General: WDWN male in NAD GAIT: left foot drop Eyes: PERRLA Pulmonary:  Non-labored, CTAB, Negative  Rales, Negative rhonchi, & Negative  wheezing.  Cardiac: regular Rhythm ,  Negative detected murmur.  VASCULAR EXAM Carotid Bruits Left Right   Negative Negative    Aorta is not palpable. Radial pulses are 2+ palpable and equal.                                                                                                                            LE Pulses LEFT RIGHT       FEMORAL   palpable   palpable        POPLITEAL  not palpable   not palpable       POSTERIOR TIBIAL  not palpable   not palpable        DORSALIS PEDIS      ANTERIOR TIBIAL  palpable   palpable     Gastrointestinal: soft, nontender, BS WNL, no r/g,  negative masses.  Musculoskeletal: Negative muscle atrophy/wasting. M/S 5/5 in arms, 3/5 in legs, Extremities without ischemic changes. He does have 2+ pretibial pitting edema in the right lower leg and 1+ in the left lower leg. Both lower legs have hemosiderin deposits, no ulcers. Soles of feet are slightly ruddy, lateral aspects of the dorsal surface of feet are slightly dusky.  Neurologic: A&O X 3; Appropriate Affect ; SENSATION ;normal;  Speech is normal CN 2-12 intact, Pain and light touch intact in extremities, Motor exam as listed above. Right foot drop with walking.   Non-Invasive Vascular Imaging CAROTID DUPLEX 12/12/2013   CEREBROVASCULAR DUPLEX EVALUATION    INDICATION: Follow-up carotid disease     PREVIOUS INTERVENTION(S):     DUPLEX EXAM:     RIGHT  LEFT  Peak Systolic Velocities (cm/s) End Diastolic Velocities (cm/s) Plaque LOCATION Peak Systolic Velocities (cm/s) End Diastolic Velocities (cm/s) Plaque  65 9  CCA PROXIMAL 86 17   82 10  CCA MID 68 14   72 11  CCA DISTAL 68 12 HT  147 0  ECA 112 0 HT  175 41 HT ICA  PROXIMAL 168 26 HT  211 45 HT ICA MID 90 23   115 18  ICA DISTAL 109 28     2.9 ICA / CCA Ratio (PSV) 2.5  Antegrade  Vertebral Flow Antegrade   [id number] Brachial Systolic Pressure (mmHg) 0000000  Within normal limits  Brachial Artery Waveforms Within normal limits      Plaque Morphology:  HM = Homogeneous, HT = Heterogeneous, CP = Calcific Plaque, SP = Smooth Plaque, IP = Irregular Plaque     ADDITIONAL FINDINGS:     IMPRESSION: 1. Evidence of 40%-59% stenosis of the right internal carotid artery. 2. Evidence of <40% stenosis of the left internal carotid artery. 3. Bilateral vertebral artery is antegrade.    Compared to the previous exam:  Right-sided disease has increased compared to previous exam.       Assessment: ALONDRA TREON is a 80 y.o. male who presents with asymptomatic mild/moderate right ICA stenosis and minimal left ICA stenosis. The right ICA stenosis is Worse from previous exam a year ago, but same as the exam on 12/10/2011. He has had a neurological evaluation of his lumbar spine issues and left foot drop, but states no help could be offered. He expresses discouragement at not being able to walk much due to foot drop, low back pain, and sciatic type pain affecting legs. Will obtain ABI's in 3 weeks to evaluate whether arterial occlusive disease is present that might affect his walking, but he has palpable DP pulses and his symptoms are more consistent with lumbar spine issues. The discoloration in his feet has improved a great deal according to his wife, since he improved his diet. Discussed with Dr. Scot Dock his opinion re referral for neurological evaluation, will refer to Dr. Erline Levine, neurosurgeon.   Plan: Follow-up in 1 year with Carotid Duplex scan, and ABI's in 3 weeks.  Referral to Dr. Erline Levine  I discussed in depth with the patient the nature of atherosclerosis, and emphasized the importance of maximal medical management including strict control of blood pressure, blood glucose, and lipid levels, obtaining regular exercise, and continued cessation of smoking.  The patient is aware that without maximal medical management the underlying atherosclerotic disease process will progress, limiting the benefit of any  interventions. The patient was given information about stroke prevention and what symptoms should prompt the patient to seek immediate medical care. Thank you for allowing [place] to participate in this patient's care.  Clemon Chambers, RN, MSN, FNP-C [facility] [place] Office: [phone]  Clinic Physician: Scot Dock  12/12/2013 12:26 PM

## 2013-12-12 NOTE — Telephone Encounter (Signed)
Spoke with patient who states he does not feel that his BP is doing what it should do - states the bottom number has been too high.  Patient is rechecking BP per my request.  Wife came to the phone and reported that patient's BP is 159/77, HR 68.  Wife states the patient has experienced a headache today and this is what prompted him to check his BP.  Wife states patient has not taken anything for the headache and has no other symptoms.  I advised her that patient can take Tylenol for pain and to recheck BP tomorrow.  I advised her to call back tomorrow if patient continues to experience symptoms or has any concerns.  Patient's wife verbalized agreement and understanding.

## 2013-12-17 ENCOUNTER — Other Ambulatory Visit: Payer: Self-pay | Admitting: *Deleted

## 2013-12-17 DIAGNOSIS — I6529 Occlusion and stenosis of unspecified carotid artery: Secondary | ICD-10-CM

## 2013-12-17 DIAGNOSIS — I739 Peripheral vascular disease, unspecified: Secondary | ICD-10-CM

## 2013-12-17 NOTE — Addendum Note (Signed)
Addended by: Dorthula Rue L on: 12/17/2013 11:30 AM   Modules accepted: Orders

## 2013-12-20 ENCOUNTER — Ambulatory Visit: Payer: Medicare Other | Admitting: Cardiovascular Disease

## 2013-12-25 ENCOUNTER — Other Ambulatory Visit: Payer: Self-pay | Admitting: *Deleted

## 2013-12-25 MED ORDER — LISINOPRIL 10 MG PO TABS: ORAL_TABLET | ORAL | Status: DC

## 2013-12-27 ENCOUNTER — Encounter: Payer: Self-pay | Admitting: Family

## 2013-12-28 ENCOUNTER — Other Ambulatory Visit (HOSPITAL_COMMUNITY): Payer: Medicare Other

## 2013-12-28 ENCOUNTER — Encounter: Payer: Self-pay | Admitting: Family

## 2013-12-28 ENCOUNTER — Ambulatory Visit (INDEPENDENT_AMBULATORY_CARE_PROVIDER_SITE_OTHER): Payer: Medicare Other | Admitting: Family

## 2013-12-28 ENCOUNTER — Ambulatory Visit (HOSPITAL_COMMUNITY)
Admission: RE | Admit: 2013-12-28 | Discharge: 2013-12-28 | Disposition: A | Discharge status: Home / Self Care | Payer: Medicare Other | Source: Ambulatory Visit | Attending: Family | Admitting: Family

## 2013-12-28 VITALS — BP 148/69 | HR 59 | Resp 16 | Ht 73.0 in | Wt 192.0 lb

## 2013-12-28 DIAGNOSIS — M79609 Pain in unspecified limb: Secondary | ICD-10-CM | POA: Insufficient documentation

## 2013-12-28 DIAGNOSIS — I6529 Occlusion and stenosis of unspecified carotid artery: Secondary | ICD-10-CM

## 2013-12-28 DIAGNOSIS — I739 Peripheral vascular disease, unspecified: Secondary | ICD-10-CM

## 2013-12-28 NOTE — Progress Notes (Signed)
[facility] [place] HISTORY AND PHYSICAL   MRN : [id number]  History of Present Illness:   Manuel [name] is a 80 y.o. male patient of Dr. Scot Dock who presents with asymptomatic mild/moderate right ICA stenosis and minimal left ICA stenosis.  The right ICA stenosis is Worse from previous exam a year ago, but same as the exam on 12/10/2011.  He has had a neurological evaluation of his lumbar spine issues and left foot drop, but states no help could be offered.   He returns today for ABI's to help ascertain whether some of his leg issues are related to arterial occlusive disease and this is unlikely according to ABI results.  He expresses discouragement at not being able to walk much due to foot drop, low back pain, and sciatic type pain affecting legs.  Will obtain ABI's in 3 weeks to evaluate whether arterial occlusive disease is present that might affect his walking, but he has palpable DP pulses and his symptoms are more consistent with lumbar spine issues.  The discoloration in his feet has improved a great deal according to his wife, since he improved his diet.  Discussed with Dr. Scot Dock his opinion re referral for neurological evaluation, will refer to Dr. Erline Levine, neurosurgeon.   Patient has Negative history of TIA or stroke symptom. The patient denies amaurosis fugax or monocular blindness. The patient denies facial drooping.  Pt. denies hemiplegia. The patient denies receptive or expressive aphasia. Pt. denies extremity weakness.  Patient denies New Medical or Surgical History.  Pt Diabetic: No  Pt smoker: non-smoker  Pt meds include:  Statin : No: has never had elevated cholesterol  ASA: No: had GI bleed and was stopped  Other anticoagulants/antiplatelets: no   Current Outpatient Prescriptions  Medication Sig Dispense Refill  . b complex vitamins tablet Take 1 tablet by mouth daily.      Marland Kitchen CALCIUM-MAGNESIUM-VITAMIN D PO Take 1 tablet by mouth  daily as needed (constipation).      . Cholecalciferol (VITAMIN D) 2000 UNITS tablet Take 2,000 Units by mouth daily.      . Coenzyme Q10 (COQ10) 200 MG CAPS Take 200 mg by mouth daily.      Marland Kitchen lisinopril (PRINIVIL,ZESTRIL) 10 MG tablet TAKE 1 TABLET BY MOUTH DAILY.  30 tablet  0  . Multiple Vitamin (MULTIVITAMIN WITH MINERALS) TABS tablet Take 1 tablet by mouth 2 (two) times daily. NutraLite      . OMEGA 3-6-9 FATTY ACIDS PO Take 1 capsule by mouth daily.      . sucralfate (CARAFATE) 1 G tablet Take 1 g by mouth 2 (two) times daily.       . Thiamine HCl (VITAMIN B-1) 250 MG tablet Take 250 mg by mouth daily.      . vitamin C (ASCORBIC ACID) 500 MG tablet Take 250-500 mg by mouth 2 (two) times daily. Take 1/2 tablet (250 mg) every morning and 1 tablet (500 mg) every evening      . VITAMIN E PO Take 1 capsule by mouth daily.       No current facility-administered medications for this visit.    Past Medical History  Diagnosis Date  . Hypertension   . Irregular heart beat   . Rectal bleed   . Mallory-Weiss tear 2011    treated with 6 units PRBCs. No longer on aspirin  . Carotid artery occlusion   . Zenker diverticulum   . Colon polyps   . Gastric hemorrhage   .  CAD (coronary artery disease)   . Hearing loss   . Peripheral neuropathy     Past Surgical History  Procedure Laterality Date  . Back surgery    . Appendectomy    . Abdominal surgery    . Hernia repair      Social History History  Substance Use Topics  . Smoking status: Never Smoker   . Smokeless tobacco: Never Used  . Alcohol Use: No    Family History Family History  Problem Relation Age of Onset  . Cancer Mother   . Heart attack Father   . Heart disease Sister     Heart Disease before age 61  . Diabetes Son   . Parkinson's disease Sister     Allergies  Allergen Reactions  . Codeine Other (See Comments)    Pass out  . Ivp Dye [Iodinated Diagnostic Agents] Other (See Comments)    Doesn't remember  reaction but it was severe  . Penicillins Itching  . Rofecoxib Other (See Comments)     elevated BP (Vioxx)  . Sulfonamide Derivatives Hives and Itching     REVIEW OF SYSTEMS: See HPI for pertinent positives and negatives.  Physical Examination Filed Vitals:   12/28/13 1422  BP: 148/69  Pulse: 59  Resp: 16  Height: 6\' 1"  (1.854 m)  Weight: 192 lb (87.091 kg)  SpO2: 96%   Body mass index is 25.34 kg/(m^2).  General: WDWN male in NAD  GAIT: left foot drop  Eyes: PERRLA  Pulmonary: Non-labored, CTAB, Negative Rales, Negative rhonchi, & Negative wheezing.  Cardiac: regular Rhythm , Negative detected murmur.  VASCULAR EXAM  Carotid Bruits  Left  Right    Negative  Negative   Aorta is not palpable.  Radial pulses are 2+ palpable and equal.  LE Pulses  LEFT  RIGHT   FEMORAL  palpable  palpable   POPLITEAL  not palpable  not palpable   POSTERIOR TIBIAL  not palpable  not palpable   DORSALIS PEDIS  ANTERIOR TIBIAL  palpable  Not palpable   Gastrointestinal: soft, nontender, BS WNL, no r/g, negative masses.  Musculoskeletal: Negative muscle atrophy/wasting. M/S 5/5 in arms, 3/5 in legs, Extremities without ischemic changes. He does have 2+ pretibial pitting edema in the right lower leg and 1+ in the left lower leg. Both lower legs have hemosiderin deposits, no ulcers. Soles of feet are slightly ruddy, lateral aspects of the dorsal surface of feet are slightly dusky.  Neurologic: A&O X 3; Appropriate Affect ; SENSATION ;normal;  Speech is normal  CN 2-12 intact, Pain and light touch intact in extremities, Motor exam as listed above. Right foot drop with walking.  Non-Invasive Vascular Imaging (12/28/2013): ABI's:  Right: 0.72, monophasic PT, AT not detected; Left: 1.02, biphasic AT and PT  ASSESSMENT:  [name] is a 80 y.o. male who presents with asymptomatic mild/moderate right ICA stenosis and minimal left ICA stenosis.  The right ICA stenosis is Worse from previous  exam a year ago, but same as the exam on 12/10/2011.  He has had a neurological evaluation of his lumbar spine issues and left foot drop, but states no help could be offered.  ABI's indicate moderate arterial occlusive disease in the right leg and normal in the left leg. The symptoms in his legs do not correlate with the ABI findings and are more likely related to neurological pathology. He has an appointment this month to see Dr. Vertell Limber.  PLAN:   Based on today's exam  and non-invasive vascular lab results, the patient is already scheduled to return in 11 months for carotid Duplex. I discussed in depth with the patient the nature of atherosclerosis, and emphasized the importance of maximal medical management including strict control of blood pressure, blood glucose, and lipid levels, obtaining regular exercise, and cessation of smoking.  The patient is aware that without maximal medical management the underlying atherosclerotic disease process will progress, limiting the benefit of any interventions.  The patient was given information about stroke prevention and what symptoms should prompt the patient to seek immediate medical care at his visit 3 weeks ago.  Thank you for allowing [place] to participate in this patient's care.  Clemon Chambers, RN, MSN, FNP-C [facility] Office: [phone]  Clinic MD: Bridgett Larsson 12/28/2013 2:34 PM

## 2014-01-07 ENCOUNTER — Telehealth: Payer: Self-pay | Admitting: Cardiovascular Disease

## 2014-01-07 MED ORDER — LISINOPRIL 20 MG PO TABS: 20.0000 mg | ORAL_TABLET | Freq: Every day | ORAL | Status: DC

## 2014-01-07 NOTE — Telephone Encounter (Signed)
New message     B/p is 166/80 HR 91 at 11:40am.  Pt is complaining of a headache and fatigue.  Pt is taking 10mg -20mg  of lisinopril daily.  Should he take more medication?

## 2014-01-07 NOTE — Telephone Encounter (Signed)
lisinopril was increased/ he will take two tablets at once till gone then his new script will return to 20 mg once daily/ pt has app in 10 days/ bmet will be done then. Pt verbalized understanding.

## 2014-01-07 NOTE — Telephone Encounter (Signed)
Have him take lisinopril 20 mg a day, continue to watch.  If he is actually taking 20 mg , have him increase to 40.  Keep a BP record I can see him ? Next week.  Maybe 2 weeks. Also will need a BMP

## 2014-01-07 NOTE — Telephone Encounter (Signed)
Starting 3/11 bp has been elevated. 177/126 183/84   160/76 135/91 151/72 133/84 169/83 157/84 Pulse range 80-100 Pt would like to know if he can double up on his med or add something else. Please advise.

## 2014-01-16 ENCOUNTER — Ambulatory Visit (INDEPENDENT_AMBULATORY_CARE_PROVIDER_SITE_OTHER): Payer: Medicare Other | Admitting: Cardiovascular Disease

## 2014-01-16 ENCOUNTER — Encounter: Payer: Self-pay | Admitting: Cardiovascular Disease

## 2014-01-16 VITALS — BP 190/86 | HR 80 | Ht 73.0 in | Wt 196.0 lb

## 2014-01-16 DIAGNOSIS — R000 Tachycardia, unspecified: Secondary | ICD-10-CM

## 2014-01-16 DIAGNOSIS — I2510 Atherosclerotic heart disease of native coronary artery without angina pectoris: Secondary | ICD-10-CM

## 2014-01-16 DIAGNOSIS — I10 Essential (primary) hypertension: Secondary | ICD-10-CM

## 2014-01-16 LAB — BASIC METABOLIC PANEL
BUN: 21 mg/dL (ref 6–23)
CO2: 26 meq/L (ref 19–32)
Calcium: 9.3 mg/dL (ref 8.4–10.5)
Chloride: 106 mEq/L (ref 96–112)
Creatinine, Ser: 1.1 mg/dL (ref 0.4–1.5)
GFR: 66.23 mL/min (ref >60.00)
GLUCOSE: 96 mg/dL (ref 70–99)
Potassium: 4.7 mEq/L (ref 3.5–5.1)
SODIUM: 137 meq/L (ref 135–145)

## 2014-01-16 LAB — TSH: TSH: 2.16 u[IU]/mL (ref 0.35–5.50)

## 2014-01-16 MED ORDER — CARVEDILOL 12.5 MG PO TABS: 12.5000 mg | ORAL_TABLET | Freq: Two times a day (BID) | ORAL | Status: DC

## 2014-01-16 NOTE — Assessment & Plan Note (Signed)
His blood pressure remains high. His heart rate is also high. We'll check a TSH today as well as a basic metabolic profile. We'll add carvedilol 12.5 mg tablets-he is to start with one half tablet twice a day for the first week and then increase it to one full tablet twice a day. I'll see him back for followup visit in 6 weeks.  He has a history of vascular disease. He is seen by Dr. Gae Gallop.

## 2014-01-16 NOTE — Patient Instructions (Addendum)
We will be adding carvedilol 12.5 mg twice a day.  I would like you to start taking 1/2  tablet twice a day for the first week or so. And then increase it up to one full tablet twice a day.  Your physician recommends that you return for lab work in: BMET, [facility]  Your physician recommends that you schedule a follow-up appointment in:6 WEEKS.

## 2014-01-16 NOTE — Assessment & Plan Note (Signed)
He has severe spinal disease according to Dr. Vertell Limber. Apparently there is no surgical procedure that will help with his neuropathic pain.

## 2014-01-16 NOTE — Progress Notes (Signed)
Consuella Lose Date of Birth  1921-10-27       [facility] [address], [address], [place], [place]  [address]   [place], [place]  [address] [phone]     [phone]   Fax  [phone]    Fax [phone]  Problem List: 1. Hypertension 2. Mallory-Weiss tear 3. Peripheral neuropathy -   History of Present Illness:  Brysyn is an 80 yo who I have not seen in the past 2 years or so.  He has had some atypical chest pain but none recently.   We is now using a scooter to get around.  He is quite please with his Amigo scooter.   January 23, 2013:   Jaycob complains of generalized fatigue.  He is sleepy.  He checks his BP frequently.   January 16, 2014: Tymar is doing OK.  He has generalized weakness most of his left leg. He otherwise feels good for someone who is 80 years old.   Current Outpatient Prescriptions on File Prior to Visit  Medication Sig Dispense Refill  . b complex vitamins tablet Take 1 tablet by mouth daily.      Marland Kitchen CALCIUM-MAGNESIUM-VITAMIN D PO Take 1 tablet by mouth daily as needed (constipation).      . Cholecalciferol (VITAMIN D) 2000 UNITS tablet Take 2,000 Units by mouth daily.      . Coenzyme Q10 (COQ10) 200 MG CAPS Take 200 mg by mouth daily.      Marland Kitchen lisinopril (PRINIVIL,ZESTRIL) 20 MG tablet Take 1 tablet (20 mg total) by mouth daily. TAKE 1 TABLET BY MOUTH DAILY.  30 tablet  11  . Multiple Vitamin (MULTIVITAMIN WITH MINERALS) TABS tablet Take 1 tablet by mouth 2 (two) times daily. NutraLite      . OMEGA 3-6-9 FATTY ACIDS PO Take 1 capsule by mouth daily.      . sucralfate (CARAFATE) 1 G tablet Take 1 g by mouth 2 (two) times daily.       . Thiamine HCl (VITAMIN B-1) 250 MG tablet Take 250 mg by mouth daily.      . vitamin C (ASCORBIC ACID) 500 MG tablet Take 250-500 mg by mouth 2 (two) times daily. Take 1/2 tablet (250 mg) every morning and 1 tablet (500 mg) every evening      . VITAMIN E PO Take 1  capsule by mouth daily.       No current facility-administered medications on file prior to visit.    Allergies  Allergen Reactions  . Codeine Other (See Comments)    Pass out  . Ivp Dye [Iodinated Diagnostic Agents] Other (See Comments)    Doesn't remember reaction but it was severe  . Penicillins Itching  . Rofecoxib Other (See Comments)     elevated BP (Vioxx)  . Sulfonamide Derivatives Hives and Itching    Past Medical History  Diagnosis Date  . Hypertension   . Irregular heart beat   . Rectal bleed   . Mallory-Weiss tear 2011    treated with 6 units PRBCs. No longer on aspirin  . Carotid artery occlusion   . Zenker diverticulum   . Colon polyps   . Gastric hemorrhage   . CAD (coronary artery disease)   . Hearing loss   . Peripheral neuropathy     Past Surgical History  Procedure Laterality Date  . Back surgery    . Appendectomy    .  Abdominal surgery    . Hernia repair      History  Smoking status  . Never Smoker   Smokeless tobacco  . Never Used    History  Alcohol Use No    Family History  Problem Relation Age of Onset  . Cancer Mother   . Heart attack Father   . Heart disease Sister     Heart Disease before age 37  . Diabetes Son   . Parkinson's disease Sister     Reviw of Systems:  Reviewed in the HPI.  All other systems are negative.  Physical Exam: Blood pressure 190/86, pulse 80, height 6\' 1"  (1.854 m), weight 196 lb (88.905 kg). General: Well developed, well nourished, in no acute distress.  Head: Normocephalic, atraumatic, sclera non-icteric, mucus membranes are moist,   Neck: Supple. Carotids are 2 + without bruits. No JVD  Lungs: Clear bilaterally to auscultation.  Heart: regular rate.  normal  S1 S2. No murmurs, gallops or rubs.  Abdomen: Soft, non-tender, non-distended with normal bowel sounds. No hepatomegaly. No rebound/guarding. No masses.  Msk:  Strength and tone are normal  Extremities: No clubbing or cyanosis. No  edema.  Distal pedal pulses are 2+ and equal bilaterally.  Neuro: Alert and oriented X 3. Moves all extremities spontaneously.  Psych:  Responds to questions appropriately with a normal affect.  ECG: January 23, 2013:  NSR at 65  Normal ECG Assessment / Plan:

## 2014-01-18 ENCOUNTER — Telehealth: Payer: Self-pay | Admitting: Cardiovascular Disease

## 2014-01-18 MED ORDER — CARVEDILOL 3.125 MG PO TABS: 3.1250 mg | ORAL_TABLET | Freq: Two times a day (BID) | ORAL | Status: DC

## 2014-01-18 NOTE — Telephone Encounter (Signed)
Pt  Called because he started on carvedilol 12.5 mg 1/2 tablet po twice a day. Pt states this AM one hour after taken his medication, he felt tired his BP was 103/56 4 to 5 hours later his BP was 147/77, pt still feels tired. Pt takes  Lisinopril 20 mg daily; Pt took Carvedilol  1/2 of the 12.5 mg (6.25 mg). Dr. [name] aware he recommends for pt to decrease the Carvedilol dose to 3.125 mg twice a day. Pt and wife are aware of the medication change pt is to F/U with [place]  to see how he is doing with the new dose. Prescription send to pt's [facility] pharmacy; pt and wife aware.

## 2014-01-18 NOTE — Telephone Encounter (Signed)
New message     This am pt's bp was 103/56 with a HR of 64.  Pt is on a new betablocker. He feels tired.  Is this normal?

## 2014-01-23 ENCOUNTER — Ambulatory Visit (INDEPENDENT_AMBULATORY_CARE_PROVIDER_SITE_OTHER): Payer: Medicare Other | Admitting: Neurology

## 2014-01-23 ENCOUNTER — Other Ambulatory Visit: Payer: Self-pay | Admitting: Neurology

## 2014-01-23 ENCOUNTER — Encounter: Payer: Self-pay | Admitting: Neurology

## 2014-01-23 VITALS — BP 160/70 | HR 62 | Wt 197.2 lb

## 2014-01-23 DIAGNOSIS — R269 Unspecified abnormalities of gait and mobility: Secondary | ICD-10-CM

## 2014-01-23 DIAGNOSIS — R260 Ataxic gait: Secondary | ICD-10-CM

## 2014-01-23 DIAGNOSIS — M21371 Foot drop, right foot: Secondary | ICD-10-CM

## 2014-01-23 DIAGNOSIS — M21372 Foot drop, left foot: Secondary | ICD-10-CM

## 2014-01-23 DIAGNOSIS — G609 Hereditary and idiopathic neuropathy, unspecified: Secondary | ICD-10-CM

## 2014-01-23 DIAGNOSIS — M216X9 Other acquired deformities of unspecified foot: Secondary | ICD-10-CM

## 2014-01-23 LAB — HEMOGLOBIN A1C
Hgb A1c MFr Bld: 5.9 % — ABNORMAL HIGH (ref <5.7)
MEAN PLASMA GLUCOSE: 123 mg/dL — AB (ref <117)

## 2014-01-23 LAB — VITAMIN B12: VITAMIN B 12: 701 pg/mL (ref 211–911)

## 2014-01-23 NOTE — Progress Notes (Signed)
[facility] Clinic Note - Initial Visit   Date: 01/23/2014    [name] MRN: [id number] DOB: [birth year]   Dear Dr Vertell Limber:  Thank you for your kind referral of Manuel [name] for consultation of neuropathy. Although his history is well known to you, please allow [place] to reiterate it for the purpose of our medical record. The patient was accompanied to the clinic by wife who also provides collateral information.     History of Present Illness: Manuel [name] is a 80 y.o. right-handed Caucasian male with history of hypertension, CAD, asymptomatic right carotid stenosis (60%), lumbar stenosis s/p L4-5 decompression (1980s), and peripheral neuropathy presenting for evaluation of bilateral foot drop (left > right) and balance problems.  Starting in late 1980s, he developed difficulty walking prolonged distances and would have to take more breaks. It started with left leg weakness and right leg became involved several years ago.  His balance has also been a problem for a long time, but worsened since 2013.  His balance is worse when he is not supporting himself and improved when he hold onto something.  he has previously been evaluated by neurology and was told he had idiopathic peripheral neuropathy.  Although I do not have clinic notes to review, his evaluation also included sural nerve biopsy. His wife says that they were told the "coating around the nerve was not working," but do not have the pathology reports to review do not recall where was performed.  He has profound bilateral foot drop, worse on the left side and has fallen about 2-3 times per year.  He is unable to get up from his falls by himself.  His weakness has progressed over the years. He has been using a scooter intermittently since 2000 and is more dependent on it since 2010.  At home, he ambulates without assistance but supports himself on furniture.  They live in a one-story home.  Over the past  6-12 months, he has noticed numbness/tingling of his hands.  He has not done any physical therapy for his legs in the past 10 years. He has tried an AFO device, but did not find it to be comfortable.    Of note, he was found to have lumbar stenosis in the 1980s and underwent L4-5 decompression. At that time, he reportedly did not have any leg weakness.  In March 2014, he was recently seen by Dr. Vertell Limber at the referral of his vascular surgeon for leg numbness and weakness.  At that evaluation, his evaluation was most consistent with severe peripheral neuropathy and not a structural process involving his lumbar spine so has been referred to me for further evaluation.   Out-side paper records, electronic medical record, and images have been reviewed where available and summarized as:  MRI lumbar spine 03/10/2012 1. Mild central and lateral recess stenosis bilaterally at L3-L4.  2. Postsurgical changes at L4-L5 with adequate decompression of the spinal canal. There is chronic mild right foraminal stenosis secondary to osteophytes.  3. Chronic pars defect on the right at L5-S1 without anterolisthesis. Chronic left foraminal stenosis due to osteophytes.  4. Stable sclerotic lesion in the L3 vertebral body.  5. No acute findings identified.  Lab Results  Component Value Date   TSH 2.16 01/16/2014   Lab Results  Component Value Date   CKTOTAL 64 08/22/2009   CKMB 1.8 08/22/2009   TROPONINI <0.30 03/10/2012     Past Medical History  Diagnosis Date  . Hypertension   .  Irregular heart beat   . Rectal bleed   . Mallory-Weiss tear 2011    treated with 6 units PRBCs. No longer on aspirin  . Carotid artery occlusion   . Zenker diverticulum   . Colon polyps   . Gastric hemorrhage   . CAD (coronary artery disease)   . Hearing loss   . Peripheral neuropathy     Past Surgical History  Procedure Laterality Date  . Back surgery    . Appendectomy    . Abdominal surgery    . Hernia repair        Medications:  Current Outpatient Prescriptions on File Prior to Visit  Medication Sig Dispense Refill  . b complex vitamins tablet Take 1 tablet by mouth daily.      Marland Kitchen CALCIUM-MAGNESIUM-VITAMIN D PO Take 1 tablet by mouth daily as needed (constipation).      . carvedilol (COREG) 3.125 MG tablet Take 1 tablet (3.125 mg total) by mouth 2 (two) times daily.  60 tablet  6  . Cholecalciferol (VITAMIN D) 2000 UNITS tablet Take 2,000 Units by mouth daily.      . Coenzyme Q10 (COQ10) 200 MG CAPS Take 200 mg by mouth daily.      Marland Kitchen lisinopril (PRINIVIL,ZESTRIL) 20 MG tablet Take 1 tablet (20 mg total) by mouth daily. TAKE 1 TABLET BY MOUTH DAILY.  30 tablet  11  . Multiple Vitamin (MULTIVITAMIN WITH MINERALS) TABS tablet Take 1 tablet by mouth 2 (two) times daily. NutraLite      . OMEGA 3-6-9 FATTY ACIDS PO Take 1 capsule by mouth daily.      . sucralfate (CARAFATE) 1 G tablet Take 1 g by mouth 2 (two) times daily.       . Thiamine HCl (VITAMIN B-1) 250 MG tablet Take 250 mg by mouth daily.      . vitamin C (ASCORBIC ACID) 500 MG tablet Take 250-500 mg by mouth 2 (two) times daily. Take 1/2 tablet (250 mg) every morning and 1 tablet (500 mg) every evening      . VITAMIN E PO Take 1 capsule by mouth daily.       No current facility-administered medications on file prior to visit.    Allergies:  Allergies  Allergen Reactions  . Codeine Other (See Comments)    Pass out  . Ivp Dye [Iodinated Diagnostic Agents] Other (See Comments)    Doesn't remember reaction but it was severe  . Penicillins Itching  . Rofecoxib Other (See Comments)     elevated BP (Vioxx)  . Sulfonamide Derivatives Hives and Itching    Family History: Family History  Problem Relation Age of Onset  . Cancer Mother   . Heart attack Father   . Heart disease Sister     Heart Disease before age 40  . Diabetes Son   . Parkinson's disease Sister     Social History: History   Social History  . Marital Status:  Married    Spouse Name: N/A    Number of Children: N/A  . Years of Education: N/A   Occupational History  . Not on file.   Social History Main Topics  . Smoking status: Never Smoker   . Smokeless tobacco: Never Used  . Alcohol Use: No  . Drug Use: No  . Sexual Activity: Not Currently   Other Topics Concern  . Not on file   Social History Narrative  . No narrative on file    Review of Systems:  CONSTITUTIONAL: No fevers, chills, night sweats, or weight loss.   EYES: No visual changes or eye pain ENT: No hearing changes.  No history of nose bleeds.   RESPIRATORY: No cough, wheezing and shortness of breath.   CARDIOVASCULAR: Negative for chest pain, and palpitations.   GI: Negative for abdominal discomfort, blood in stools or black stools.  No recent change in bowel habits.   GU:  No history of incontinence.   MUSCLOSKELETAL: + history of joint pain or swelling.  + myalgias.   SKIN: Negative for lesions, rash, and itching.   HEMATOLOGY/ONCOLOGY: Negative for prolonged bleeding, bruising easily, and swollen nodes.  No history of cancer.   ENDOCRINE: +for cold or heat intolerance, polydipsia or goiter.   PSYCH:  No depression or anxiety symptoms.   NEURO: As Above.  Vital Signs:  BP 160/70  Pulse 62  Wt 197 lb 3 oz (89.444 kg)  SpO2 95%  Neurological Exam: MENTAL STATUS including orientation to time, place, person, recent and remote memory, attention span and concentration, language, and fund of knowledge is normal.  Speech is not dysarthric.  CRANIAL NERVES: II:  No visual field defects.  Unremarkable fundi.   III-IV-VI: Pupils equal round and reactive to light.  Normal conjugate, extra-ocular eye movements in all directions of gaze.  No nystagmus.  No ptosis.   V:  Normal facial sensation.   VII:  Normal facial symmetry and movements.   VIII:  Normal hearing and vestibular function.   IX-X:  Normal palatal movement.   XI:  Normal shoulder shrug and head rotation.    XII:  Normal tongue strength and range of motion, no deviation or fasciculation.  MOTOR:  Marked atrophy of bilateral quadriceps, tibialis anterior, gastrocnemius, and intrinsic foot muscles. There is no fasciculations or abnormal movement. Tone is reduced in the lower extremities.  Right Upper Extremity:    Left Upper Extremity:    Deltoid  5/5   Deltoid  5/5   Biceps  5/5   Biceps  5/5   Triceps  5/5   Triceps  5/5   Wrist extensors  5/5   Wrist extensors  5/5   Wrist flexors  5/5   Wrist flexors  5/5   Finger extensors  5/5   Finger extensors  5/5   Finger flexors  5/5   Finger flexors  5/5   Dorsal interossei  5-/5   Dorsal interossei  5-/5   Abductor pollicis  5-/5   Abductor pollicis  5-/5   Tone (Ashworth scale)  0  Tone (Ashworth scale)  0   Right Lower Extremity:    Left Lower Extremity:    Hip flexors  4+/5   Hip flexors  4+/5   Hip extensors  5-/5   Hip extensors  5-/5   Knee flexors  4/5   Knee flexors  4/5   Knee extensors  4-/5   Knee extensors  4-/5   Dorsiflexors  0/5   Dorsiflexors  0/5   Plantarflexors  0/5   Plantarflexors  0/5   Toe extensors  1/5   Toe extensors  0/5   Toe flexors  0/5   Toe flexors  0/5   Tone (Ashworth scale)  0  Tone (Ashworth scale)  0   MSRs:  Right  Left brachioradialis 1+  brachioradialis 1+  biceps 1+  biceps 1+  triceps 1+  triceps 1+  patellar 0  patellar 0  ankle jerk 0  ankle jerk 0  Hoffman no  Hoffman no  plantar response mute  plantar response mute   SENSORY:  Pin prick educed below the knees and in the hands bilaterally.  Vibration reduced to 50% at knees and absent distal to ankles.  Impaired proprioception at the great toe and intact at the DIP.  Reduced temperature in the feet.  COORDINATION/GAIT:  Normal finger-to- nose-finger.  Finger tapping intact bilaterally.  In position with heel tapping due to weakness.  He gait is unsteady and ataxic with bilateral  high steppage (left greater than right), there is dragging of the feet bilaterally.    IMPRESSION: Mr. Jepsen is a delightful 80 year-old gentleman presenting for evaluation of bilateral foot drop.  His examination shows gradient pattern of sensorimotor neuropathy involving the legs (distal to knees) and hands, which is severe in the legs.  He has seen a neurologist in the past and even had a nerve biopsy which reportedly did not disclose any etiology.  His neuropathy has not been reassessed since symptoms have worsened.  Given the severity of symptoms, I would like to look for treatable causes of neuropathy and will obtain EMG of the left side to determine whether his neuropathy is demyelinating or axonal loss especially since his wife reports there was "loss of normal coating around the nerve".  I explained the goals of management is to first see if this is treatable and second, try to optimize his proximal leg and hand strength, since his normal functioning is dependent upon this. Unfortunately, I do not feel that there will be in the recovery of his distal lower extremity weakness given the severity.  I had an extensive discussion regarding the pathogenesis of neuropathy, potential etiology, management options, and natural course of the disease. I offered the opportunity to ask questions and answered them to the best of my ability.   PLAN/RECOMMENDATIONS:  1.  Check HbA1c, copper, vitamin B12, vitamin E, vitamin B1, zinc, SSA/B 2.  EMG of the left side 3.  Physical therapy for proximal leg strengthening 4.  Fall precautions discussed at length 5.  Return to clinic in 6-weeks   The duration of this appointment visit was 60 minutes of face-to-face time with the patient.  Greater than 50% of this time was spent in counseling, explanation of diagnosis, planning of further management, and coordination of care.   Thank you for allowing me to participate in patient's care.  If I can answer any  additional questions, I would be pleased to do so.    Sincerely,    Lindie Roberson K. Posey Pronto, DO

## 2014-01-23 NOTE — Patient Instructions (Signed)
1.  Check blood work  2.  EMG of the left side 3.  Literature on fall precautions discussed 4.  Physical therapy for gait training and proximal leg strengthening 5.  Return to clinic in 6 weeks  [facility]  Preventing Falls in the [employer] are common, often dreaded events in the lives of older people. Aside from the obvious injuries and even death that may result, falls can cause wide-ranging consequences including loss of independence, mental decline, decreased activity, and mobility. Younger people are also at risk of falling, especially those with chronic illnesses and fatigue.  Ways to reduce the risk for falling:  * Examine diet and medications. Warm foods and alcohol dilate blood vessels, which can lead to dizziness when standing. Sleep aids, antidepressants, and pain medications can also increase the likelihood of a fall.  * Get a vison exam. Poor vision, cataracts, and glaucoma increase the chances of falling.  * Check foot gear. Shoes should fit snugly and have a sturdy, nonskid sole and broad, low heel.  * Participate in a physician-approved exercise program to build and maintain muscle strength and improve balance and coordination.  * Increase vitamin D intake. Vitamin D improves muscle strength and increases the amount of calcium the body is able to absorb and deposit in bones.  How to prevent falls from common hazards:  * Floors - Remove all loose wires, cords, and throw rugs. Minimize clutter. Make sure rugs are anchored and smooth. Keep furniture in its usual place.  * Chairs - Use chairs with straight backs, armrests, and firm seats. Add firm cushions to existing pieces to add height.  * Bathroom - Install grab bars and non-skid tape in the tub or shower. Use a bathtub transfer bench or a shower chair with a back support. Use an elevated toilet seat and/or safety rails to assist standing from a low surface. Do not use towel racks or bathroom tissue holders to help you  stand.  * Lighting - Make sure halls, stairways, and entrances are well-lit. Install a night light in your bathroom or hallway. Make sure there is a light switch at the top and bottom of the staircase. Turn lights on if you get up in the middle of the night. Make sure lamps or light switches are within reach of the bed if you have to get up during the night.  * Kitchen - Install non-skid rubber mats near the sink and stove. Clean spills immediately. Store frequently used utensils, pots, and pans between waist and eye level. This helps prevent reaching and bending. Sit when getting things out of the lower cupboards.  * Living room / Ray furniture with wide spaces in between, giving enough room to move around. Establish a route through the living room that gives you something to hold onto as you walk.  * Stairs - Make sure treads, rails, and rugs are secure. Install a rail on both sides of the stairs. If stairs are a threat, it might be helpful to arrange most of your activities on the lower level to reduce the number of times you must climb the stairs.  * Entrances and doorways - Install metal handles on the walls adjacent to the doorknobs of all doors to make it more secure as you travel through the doorway.  Tips for maintaining balance:  * Keep at least one hand free at all times Try using a backpack or fanny pack to hold things rather than carrying  them in your hands. Never carry objects in both hands when walking as this interferes with keeping your balance.  * Attempt to swing both arms from front to back while walking. This might require a conscious effort if Parkinson's disease has diminished your movement. It will, however, help you to maintain balance and posture, and reduce fatigue.  * Consciously lift your feet off the ground when walking. Shuffling and dragging of the feet is a common culprit in losing your balance.  * When trying to navigate turns, use a "U" technique of facing  forward and making a wide turn, rather than pivoting sharply.  * Try to stand with your feet shoulder-length apart. When your feet are close together for any length of time, you increase your risk of losing your balance and falling.  * Do one thing at a time. Do not try to walk and accomplish another task, such as reading or looking around. The decrease in your automatic reflexes complicates motor function, so the less distraction, the better.  * Do not wear rubber or gripping soled shoes, they might "catch" on the floor and cause tripping.  * Move slowly when changing positions. Use deliberate, concentrated movements and, if needed, use a grab bar or walking aid. Count fifteen (15) seconds after standing to begin walking.  * If balance is a continuous problem, you might want to consider a walking aid such as a cane, walking stick, or walker. Once you have mastered walking with help, you may be ready to try it again on your own.  This information is provided by [facility] and is not intended to replace the medical advice of your physician or other health care providers. Please consult your physician or other health care providers for advice regarding your specific medical condition.

## 2014-01-24 LAB — SJOGREN'S SYNDROME ANTIBODS(SSA + SSB)
SSA (Ro) (ENA) Antibody, IgG: <1
SSB (La) (ENA) Antibody, IgG: <1

## 2014-01-24 NOTE — Progress Notes (Signed)
Note faxed.

## 2014-01-25 LAB — COPPER, SERUM: Copper: 89 ug/dL (ref 70–175)

## 2014-01-26 LAB — ZINC: Zinc: 73 ug/dL (ref 60–130)

## 2014-01-26 LAB — VITAMIN E
GAMMA-TOCOPHEROL (VIT E): 0.4 mg/L (ref <=4.3)
VITAMIN E (ALPHA TOCOPHEROL): 27.2 mg/L — AB (ref 5.7–19.9)

## 2014-01-27 LAB — VITAMIN B6: Vitamin B6: 71.5 ng/mL — ABNORMAL HIGH (ref 2.1–21.7)

## 2014-01-28 ENCOUNTER — Telehealth: Payer: Self-pay | Admitting: Neurology

## 2014-01-28 NOTE — Telephone Encounter (Signed)
I attempted to contact patient via phone today regarding the results of lab testing, however there was no answer so a message was left for the patient to return my call.   Manuel [name], if patient returns my call, please let him know that his labs look good, except his vitamin B6 is elevated.  High levels can make neuropathy worse.  I want him to stop taking vitamin B complex since it has B6.    Thanks.  Stevon Gough K. Posey Pronto, DO

## 2014-01-30 NOTE — Telephone Encounter (Signed)
Called pt and informed him about his labs and gave him instructions to stop the vitamin B complex.  Patient agreed with plan.

## 2014-02-08 ENCOUNTER — Ambulatory Visit: Payer: Medicare Other | Attending: Family Medicine | Admitting: Physical Therapy

## 2014-02-08 DIAGNOSIS — R279 Unspecified lack of coordination: Secondary | ICD-10-CM | POA: Insufficient documentation

## 2014-02-08 DIAGNOSIS — M6281 Muscle weakness (generalized): Secondary | ICD-10-CM | POA: Insufficient documentation

## 2014-02-08 DIAGNOSIS — R269 Unspecified abnormalities of gait and mobility: Secondary | ICD-10-CM | POA: Insufficient documentation

## 2014-02-08 DIAGNOSIS — IMO0001 Reserved for inherently not codable concepts without codable children: Secondary | ICD-10-CM | POA: Insufficient documentation

## 2014-02-22 ENCOUNTER — Ambulatory Visit: Payer: Medicare Other | Attending: Family Medicine | Admitting: Physical Therapy

## 2014-02-22 DIAGNOSIS — IMO0001 Reserved for inherently not codable concepts without codable children: Secondary | ICD-10-CM | POA: Insufficient documentation

## 2014-02-22 DIAGNOSIS — R269 Unspecified abnormalities of gait and mobility: Secondary | ICD-10-CM | POA: Insufficient documentation

## 2014-02-22 DIAGNOSIS — R279 Unspecified lack of coordination: Secondary | ICD-10-CM | POA: Insufficient documentation

## 2014-02-22 DIAGNOSIS — M6281 Muscle weakness (generalized): Secondary | ICD-10-CM | POA: Insufficient documentation

## 2014-03-01 ENCOUNTER — Encounter: Payer: Self-pay | Admitting: Cardiovascular Disease

## 2014-03-01 ENCOUNTER — Other Ambulatory Visit: Payer: Self-pay | Admitting: *Deleted

## 2014-03-01 ENCOUNTER — Ambulatory Visit (INDEPENDENT_AMBULATORY_CARE_PROVIDER_SITE_OTHER): Payer: Medicare Other | Admitting: Cardiovascular Disease

## 2014-03-01 VITALS — BP 124/54 | HR 70 | Ht 73.0 in | Wt 198.0 lb

## 2014-03-01 DIAGNOSIS — G609 Hereditary and idiopathic neuropathy, unspecified: Secondary | ICD-10-CM

## 2014-03-01 DIAGNOSIS — I2510 Atherosclerotic heart disease of native coronary artery without angina pectoris: Secondary | ICD-10-CM

## 2014-03-01 DIAGNOSIS — I10 Essential (primary) hypertension: Secondary | ICD-10-CM

## 2014-03-01 NOTE — Assessment & Plan Note (Signed)
No angina 

## 2014-03-01 NOTE — Progress Notes (Signed)
Manuel [name] Date of Birth  12-Jul-1922       [facility] [address], [address], [place], [place]  [address]   [place], [place]  [address] [phone]     [phone]   Fax  [phone]    Fax [phone]  Problem List: 1. Hypertension 2. Mallory-Weiss tear 3. Peripheral neuropathy -   History of Present Illness:  Manuel [name] is an 80 yo who I have not seen in the past 2 years or so.  He has had some atypical chest pain but none recently.   We is now using a scooter to get around.  He is quite please with his Amigo scooter.   January 23, 2013:   Manuel [name] complains of generalized fatigue.  He is sleepy.  He checks his BP frequently.   January 16, 2014: Manuel [name] is doing OK.  He has generalized weakness most of his left leg. He otherwise feels good for someone who is 80 years old.  Mar 01, 2014:    Current Outpatient Prescriptions on File Prior to Visit  Medication Sig Dispense Refill  . CALCIUM-MAGNESIUM-VITAMIN D PO Take 1 tablet by mouth daily as needed (constipation).      . carvedilol (COREG) 3.125 MG tablet Take 1 tablet (3.125 mg total) by mouth 2 (two) times daily.  60 tablet  6  . Cholecalciferol (VITAMIN D) 2000 UNITS tablet Take 2,000 Units by mouth daily.      . Coenzyme Q10 (COQ10) 200 MG CAPS Take 200 mg by mouth daily.      Marland Kitchen lisinopril (PRINIVIL,ZESTRIL) 20 MG tablet Take 1 tablet (20 mg total) by mouth daily. TAKE 1 TABLET BY MOUTH DAILY.  30 tablet  11  . Multiple Vitamin (MULTIVITAMIN WITH MINERALS) TABS tablet Take 1 tablet by mouth 2 (two) times daily. NutraLite      . OMEGA 3-6-9 FATTY ACIDS PO Take 1 capsule by mouth daily.      . sucralfate (CARAFATE) 1 G tablet Take 1 g by mouth 2 (two) times daily.       . vitamin C (ASCORBIC ACID) 500 MG tablet Take 250-500 mg by mouth 2 (two) times daily. Take 1/2 tablet (250 mg) every morning and 1 tablet (500 mg) every evening      . VITAMIN E PO Take 1 capsule  by mouth daily.       No current facility-administered medications on file prior to visit.    Allergies  Allergen Reactions  . Codeine Other (See Comments)    Pass out  . Ivp Dye [Iodinated Diagnostic Agents] Other (See Comments)    Doesn't remember reaction but it was severe  . Penicillins Itching  . Rofecoxib Other (See Comments)     elevated BP (Vioxx)  . Sulfonamide Derivatives Hives and Itching    Past Medical History  Diagnosis Date  . Hypertension   . Irregular heart beat   . Rectal bleed   . Mallory-Weiss tear 2011    treated with 6 units PRBCs. No longer on aspirin  . Carotid artery occlusion   . Zenker diverticulum   . Colon polyps   . Gastric hemorrhage   . CAD (coronary artery disease)   . Hearing loss   . Peripheral neuropathy     Past Surgical History  Procedure Laterality Date  . Back surgery    . Appendectomy    . Abdominal surgery    .  Hernia repair      History  Smoking status  . Never Smoker   Smokeless tobacco  . Never Used    History  Alcohol Use No    Family History  Problem Relation Age of Onset  . Cancer Mother     Deceased, 47  . Heart attack Father     Deceased, 54  . Heart disease Sister     Heart Disease before age 63  . Diabetes Son   . Parkinson's disease Sister   . Stroke Other     Deceased, siblings  . Heart disease Other     Deceased, siblings    Reviw of Systems:  Reviewed in the HPI.  All other systems are negative.  Physical Exam: Blood pressure 124/54, pulse 70, height 6\' 1"  (1.854 m), weight 198 lb (89.812 kg). General: Well developed, well nourished, in no acute distress.  Head: Normocephalic, atraumatic, sclera non-icteric, mucus membranes are moist,   Neck: Supple. Carotids are 2 + without bruits. No JVD  Lungs: Clear bilaterally to auscultation.  Heart: regular rate.  normal  S1 S2. No murmurs, gallops or rubs.  Abdomen: Soft, non-tender, non-distended with normal bowel sounds. No  hepatomegaly. No rebound/guarding. No masses.  Msk:  Strength and tone are normal  Extremities: No clubbing or cyanosis. No edema.  Distal pedal pulses are 2+ and equal bilaterally.  Neuro: Alert and oriented X 3. Moves all extremities spontaneously.  Psych:  Responds to questions appropriately with a normal affect.  ECG: January 23, 2013:  NSR at 65  Normal ECG Assessment / Plan:

## 2014-03-01 NOTE — Assessment & Plan Note (Signed)
His blood pressure and heart rate are very well controlled. Continue with his current medications. Seen again in 6 months for followup office visit.

## 2014-03-01 NOTE — Patient Instructions (Signed)
Your physician recommends that you continue on your current medications as directed. Please refer to the Current Medication list given to you today.  Your physician wants you to follow-up in: 6 months. You will receive a reminder letter in the mail two months in advance. If you don't receive a letter, please call our office to schedule the follow-up appointment.  

## 2014-03-07 ENCOUNTER — Encounter: Payer: Self-pay | Admitting: Neurology

## 2014-03-07 ENCOUNTER — Ambulatory Visit (INDEPENDENT_AMBULATORY_CARE_PROVIDER_SITE_OTHER): Payer: Medicare Other | Admitting: Neurology

## 2014-03-07 DIAGNOSIS — G609 Hereditary and idiopathic neuropathy, unspecified: Secondary | ICD-10-CM

## 2014-03-07 NOTE — Progress Notes (Signed)
See procedure note for EMG results.  Donika K. Patel, DO  

## 2014-03-07 NOTE — Procedures (Signed)
[facility]  [place], [place]  [place], [place] [address] Tel: [phone] Fax:  [phone] Test Date:  03/07/2014  Patient: Manuel [name] DOB: [birth year] Physician: Narda Amber, DO  Sex: Male Height: 6\' 2"  Ref Phys: Narda Amber  ID#: [id number] Temp: 32.6C Technician: Laureen Ochs R. NCS T.   Patient Complaints: Patient is an 80 year old right handed male with bilateral foot drop.  NCV & EMG Findings: Extensive electrodiagnostic evaluation of left upper extremity and lower extremity reveals:  1. In the upper extremity, the median, ulnar, and radial sensory responses are diffusely reduced.  There is also mild prolongation of the radial response.  Sensory responses (sural and superficial peroneal nerve) in the lower extremity are absent. 2. The left ulnar motor nerve showed prolonged distal onset latency (3.4 ms), reduced amplitude (4.6 mV), and decreased conduction velocity (A Elbow-B Elbow, 40 m/s).  The median motor response is normal. 3. Absent tibial and peroneal motor responses. 4. No motor unit recruitment is seen despite maximal activation in the tibialis anterior, medial gastrocnemius, and flexor digitorum longus muscles. Chronic motor axonal loss changes are seen affecting the vastus lateralis and abductor longus muscles, with sparse changes in the gluteus medius muscles. 5. In the upper extremity, chronic motor axonal loss changes are seen affecting predominantly the distal muscles of the arm conforming to a length-dependent pattern.  Impression: There is electrophysiological evidence of a severe length-dependent sensorimotor polyneuropathy, predominately axon loss in type, affecting the left side. A superimposed multilevel radiculopathy affecting the cervical and lumbosacral roots cannot be excluded by this study   ___________________________ Narda Amber, DO    Nerve Conduction Studies Anti Sensory Summary Table   Site NR Peak (ms) Norm Peak  (ms) P-T Amp (V) Norm P-T Amp  Left Median Anti Sensory (2nd Digit)  Wrist    3.7 <3.8 6.6 >10  Left Radial Anti Sensory (Base 1st Digit)  Wrist    2.9 <2.8 3.2 >10  Left Sup Peroneal Anti Sensory (Ant Lat Mall)  12 cm NR  <4.6  >3  Left Sural Anti Sensory (Lat Mall)  Calf NR  <4.6  >3  Left Ulnar Anti Sensory (5th Digit)  Wrist    3.2 <3.2 3.3 >5   Motor Summary Table   Site NR Onset (ms) Norm Onset (ms) O-P Amp (mV) Norm O-P Amp Site1 Site2 Delta-0 (ms) Dist (cm) Vel (m/s) Norm Vel (m/s)  Left Median Motor (Abd Poll Brev)  Wrist    3.8 <4.0 8.1 >5 Elbow Wrist 6.2 32.0 52 >50  Elbow    10.0  7.3         Left Peroneal Motor (Ext Dig Brev)  Ankle NR  <6.0  >2.5        Left Peroneal TA Motor (Tib Ant)  Fib Head NR  <4.5  >3 Poplit Fib Head  0.0  >40  Poplit NR            Left Tibial Motor (Abd Hall Brev)  Ankle NR  <6.0  >4 Knee Ankle  0.0  >40  Knee NR            Left Ulnar Motor (Abd Dig Minimi)  Wrist    3.4 <3.1 4.6 >7 B Elbow Wrist 5.3 27.0 51 >50  B Elbow    8.7  4.6  A Elbow B Elbow 2.5 10.0 40 >50  A Elbow    11.2  4.1  EMG   Side Muscle Ins Act Fibs Psw Fasc Number Recrt Dur Dur. Amp Amp. Poly Poly. Comment  Left AntTibialis Nml Nml Nml Nml None None - - - - - - N/A  Left Gastroc Nml Nml Nml Nml None None - - - - - - N/A  Left Flex Dig Long Nml Nml Nml Nml None Rapid - - - - - - N/A  Left VastusLat Nml Nml Nml Nml 3- Rapid All 1+ All 1+ All 1+ N/A  Left AdductorLong Nml Nml Nml Nml 3- Rapid Many 1+ Nml Nml Nml Nml N/A  Left GluteusMed Nml Nml Nml Nml 1- Rapid Some 2+ Few 1+ Some 1+ N/A  Left 1stDorInt Nml Nml Nml Nml 3- Rapid Most 1+ Many 1+ Nml Nml N/A  Left Abd Poll Brev Nml Nml Nml Nml 2- Rapid Most 1+ Most 1+ Nml Nml N/A  Left PronatorTeres Nml Nml Nml Nml 1- Mod-R Some 1+ Nml Nml Nml Nml N/A  Left Ext Indicis Nml Nml Nml Nml 3- Rapid Most 1+ Nml Nml Nml Nml N/A  Left Biceps Nml Nml Nml Nml 1- Mod-R Some 1+ Nml Nml Nml Nml N/A  Left Triceps Nml Nml Nml  Nml Nml Nml Nml Nml Nml Nml Nml Nml N/A  Left Deltoid Nml Nml Nml Nml 1- Mod-R Few 1+ Nml Nml Nml Nml N/A      Waveforms:

## 2014-03-08 ENCOUNTER — Ambulatory Visit: Payer: Medicare Other

## 2014-03-21 ENCOUNTER — Ambulatory Visit: Payer: Medicare Other

## 2014-03-22 ENCOUNTER — Ambulatory Visit (INDEPENDENT_AMBULATORY_CARE_PROVIDER_SITE_OTHER): Payer: Medicare Other | Admitting: Neurology

## 2014-03-22 ENCOUNTER — Encounter: Payer: Self-pay | Admitting: Neurology

## 2014-03-22 VITALS — BP 146/58 | HR 76 | Resp 14 | Ht 73.0 in | Wt 194.0 lb

## 2014-03-22 DIAGNOSIS — G609 Hereditary and idiopathic neuropathy, unspecified: Secondary | ICD-10-CM

## 2014-03-22 NOTE — Patient Instructions (Signed)
Continue home exercises If symptoms worsen, please contact my office for an appointment

## 2014-03-22 NOTE — Progress Notes (Signed)
Follow-up Visit   Date: 03/22/2014    [name] MRN: [id number] DOB: 24-Feb-1922   Interim History: Manuel [name] is a 80 y.o. right-handed Caucasian male with history of hypertension, CAD, asymptomatic right carotid stenosis (60%), lumbar stenosis s/p L4-5 decompression (1980s), and peripheral neuropathy returning to clinic regarding idiopathic peripheral neuropathy manifesting with bilateral foot drop (left > right) and imbalance.  History of present illness:  Starting in late 1980s, he developed difficulty walking prolonged distances and would have to take frequent breaks. He then developed left leg weakness and right leg became involved several years ago. His balance has also been a problem for a long time, but worsened since 2013 and worse when he is not supporting. He has previously been evaluated by neurology and was told he had idiopathic peripheral neuropathy. Although I do not have clinic notes to review, his evaluation also included sural nerve biopsy. His wife says that they were told the "coating around the nerve was not working," but do not have the pathology reports to review do not recall where was performed. He has profound bilateral foot drop, worse on the left side and has fallen about 2-3 times per year. He is unable to get up from his falls by himself. His weakness has progressed over the years. He has been using a scooter intermittently since 2000 and is more dependent on it since 2010. At home, he ambulates without assistance but supports himself on furniture. They live in a one-story home. Over the past 6-12 months, he has noticed numbness/tingling of his hands.   He has not done any physical therapy for his legs in the past 10 years. He has tried an AFO device, but did not find it to be comfortable.   Of note, he was found to have lumbar stenosis in the 1980s and underwent L4-5 decompression. At that time, he reportedly did not have any leg weakness.   In  March 2014, he was recently seen by Dr. Vertell Limber at the referral of his vascular surgeon for leg numbness and weakness. At that evaluation, his evaluation was most consistent with severe peripheral neuropathy and not a structural process involving his lumbar spine so has been referred to me for further evaluation.  Follow-up 03/22/2014:  He is here to discuss results.  EMG shows length-dependent peripheral neuropathy worse in the legs, but also involving the hands.  Labs showed elevated vitamin B6 and he has since stopped taking supplements, but otherwise non-revealing.  He denies any new worsening of symptoms.  Denies any interval hospitalizations or falls.    Medications:  Current Outpatient Prescriptions on File Prior to Visit  Medication Sig Dispense Refill  . carvedilol (COREG) 3.125 MG tablet Take 1 tablet (3.125 mg total) by mouth 2 (two) times daily.  60 tablet  6  . Cholecalciferol (VITAMIN D) 2000 UNITS tablet Take 2,000 Units by mouth daily.      . Coenzyme Q10 (COQ10) 200 MG CAPS Take 200 mg by mouth daily.      Marland Kitchen lisinopril (PRINIVIL,ZESTRIL) 20 MG tablet Take 1 tablet (20 mg total) by mouth daily. TAKE 1 TABLET BY MOUTH DAILY.  30 tablet  11  . Multiple Vitamin (MULTIVITAMIN WITH MINERALS) TABS tablet Take 1 tablet by mouth 2 (two) times daily. NutraLite      . OMEGA 3-6-9 FATTY ACIDS PO Take 1 capsule by mouth daily.      . vitamin C (ASCORBIC ACID) 500 MG tablet Take 250-500 mg by  mouth 2 (two) times daily. Take 1/2 tablet (250 mg) every morning and 1 tablet (500 mg) every evening      . VITAMIN E PO Take 1 capsule by mouth daily.       No current facility-administered medications on file prior to visit.    Allergies:  Allergies  Allergen Reactions  . Codeine Other (See Comments)    Pass out  . Ivp Dye [Iodinated Diagnostic Agents] Other (See Comments)    Doesn't remember reaction but it was severe  . Penicillins Itching  . Rofecoxib Other (See Comments)     elevated BP  (Vioxx)  . Sulfonamide Derivatives Hives and Itching     Review of Systems:  CONSTITUTIONAL: No fevers, chills, night sweats, or weight loss.   EYES: No visual changes or eye pain ENT: No hearing changes.  No history of nose bleeds.   RESPIRATORY: No cough, wheezing and shortness of breath.   CARDIOVASCULAR: Negative for chest pain, and palpitations.   GI: Negative for abdominal discomfort, blood in stools or black stools.  No recent change in bowel habits.   GU:  No history of incontinence.   MUSCLOSKELETAL: No history of joint pain or swelling.  No myalgias.   SKIN: Negative for lesions, rash, and itching.   ENDOCRINE: Negative for cold or heat intolerance, polydipsia or goiter.   PSYCH:  No depression or anxiety symptoms.   NEURO: As Above.   Vital Signs:  BP 146/58  Pulse 76  Resp 14  Ht 6\' 1"  (1.854 m)  Wt 194 lb (87.998 kg)  BMI 25.60 kg/m2  Neurological Exam: MENTAL STATUS including orientation to time, place, person, recent and remote memory, attention span and concentration, language, and fund of knowledge is normal.  Speech is not dysarthric.  CRANIAL NERVES: Pupils equal round and reactive to light.  Normal conjugate, extra-ocular eye movements in all directions of gaze.  No ptosis. Normal facial sensation.  Face is symmetric. Palate elevates symmetrically.  Tongue is midline.  MOTOR: Marked atrophy of bilateral quadriceps, tibialis anterior, gastrocnemius, and intrinsic foot muscles. Tone is reduced in the lower extremities.  Right Upper Extremity:    Left Upper Extremity:    Deltoid  5/5   Deltoid  5/5   Biceps  5/5   Biceps  5/5   Triceps  5/5   Triceps  5/5   Wrist extensors  5/5   Wrist extensors  5/5   Wrist flexors  5/5   Wrist flexors  5/5   Finger extensors  5/5   Finger extensors  5/5   Finger flexors  5/5   Finger flexors  5/5   Dorsal interossei  5-/5   Dorsal interossei  5-/5   Abductor pollicis  5-/5   Abductor pollicis  5-/5   Tone (Ashworth  scale)  0   Tone (Ashworth scale)  0    Right Lower Extremity:    Left Lower Extremity:    Hip flexors  4+/5   Hip flexors  4+/5   Hip extensors  5-/5   Hip extensors  5-/5   Knee flexors  4/5   Knee flexors  4/5   Knee extensors  4-/5   Knee extensors  4-/5   Dorsiflexors  0/5   Dorsiflexors  0/5   Plantarflexors  0/5   Plantarflexors  0/5   Toe extensors  1/5   Toe extensors  0/5   Toe flexors  0/5   Toe flexors  0/5   Tone (  Ashworth scale)  0   Tone (Ashworth scale)  0    MSRs:  Right       Left  brachioradialis  1+   brachioradialis  1+   biceps  1+   biceps  1+   triceps  1+   triceps  1+   patellar  0   patellar  0   ankle jerk  0   ankle jerk  0   Hoffman  no   Hoffman  no   plantar response  mute   plantar response  mute    SENSORY: Vibration reduced to 50% at knees and absent distal to ankles.   COORDINATION/GAIT: He gait is unsteady and ataxic with bilateral high steppage (left greater than right), there is dragging of the feet bilaterally.    Data: Labs 01/16/2014:  TSH 2.16 Labs 01/23/2014:  vitamin E 0.4, SSA/B neg, HbA1c 5.9, zinc 73, B12 701, copper 89, vitamin B6 71.5 EMG 03/07/2014: NCV & EMG Findings:  Extensive electrodiagnostic evaluation of left upper extremity and lower extremity reveals:  1. In the upper extremity, the median, ulnar, and radial sensory responses are diffusely reduced. There is also mild prolongation of the radial response. Sensory responses (sural and superficial peroneal nerve) in the lower extremity are absent. 2. The left ulnar motor nerve showed prolonged distal onset latency (3.4 ms), reduced amplitude (4.6 mV), and decreased conduction velocity (A Elbow-B Elbow, 40 m/s). The median motor response is normal. 3. Absent tibial and peroneal motor responses. 4. No motor unit recruitment is seen despite maximal activation in the tibialis anterior, medial gastrocnemius, and flexor digitorum longus muscles. Chronic motor axonal loss changes are  seen affecting the vastus lateralis and abductor longus muscles, with sparse changes in the gluteus medius muscles. 5. In the upper extremity, chronic motor axonal loss changes are seen affecting predominantly the distal muscles of the arm conforming to a length-dependent pattern. Impression:  There is electrophysiological evidence of a severe length-dependent sensorimotor polyneuropathy, predominately axon loss in type, affecting the left side. A superimposed multilevel radiculopathy affecting the cervical and lumbosacral roots cannot be excluded by this study  MRI lumbar spine 03/10/2012  1. Mild central and lateral recess stenosis bilaterally at L3-L4.  2. Postsurgical changes at L4-L5 with adequate decompression of the spinal canal. There is chronic mild right foraminal stenosis secondary to osteophytes.  3. Chronic pars defect on the right at L5-S1 without anterolisthesis. Chronic left foraminal stenosis due to osteophytes.  4. Stable sclerotic lesion in the L3 vertebral body.  5. No acute findings identified.   IMPRESSION/PLAN: Mr. Finnan is a delightful 80 year-old gentleman presenting for evaluation of bilateral foot drop. His examination shows gradient pattern of sensorimotor neuropathy involving the legs (distal to knees) and hands, which is severe in the legs. He has previously had nerve biopsy which reportedly did not disclose any etiology.  His recent EMG shows severe length-dependent sensorimotor polyneuropathy, predominately axon loss in type.  There is no associated active changes, so I suspect that his hand weakness is due to progression of neuropathy.  Neuropathy labs have been nonrevealing except elevated vitamin B6 which he has stopped.  Unfortunately, he most likely has severe idiopathic peripheral neuropathy in which case there is no treatment to halt the progression of symptoms.  He does not complain of burning pain, so there is no role for neuralgesic medications.  I would  like for him to continue home exercises to maintain strength of his hands and proximal legs.  Unfortunately, I do not feel that there will be in the recovery of his distal lower extremity weakness based on the severity.  I had an extensive discussion regarding the pathogenesis of neuropathy, potential etiology, management options, and natural course of the disease. I offered the opportunity to ask questions and answered them to the best of my ability.  He will return to clinic as needed.   The duration of this appointment visit was 30 minutes of face-to-face time with the patient.  Greater than 50% of this time was spent in counseling, explanation of diagnosis, planning of further management, and coordination of care.   Thank you for allowing me to participate in patient's care.  If I can answer any additional questions, I would be pleased to do so.    Sincerely,    Donika K. Posey Pronto, DO

## 2014-04-04 ENCOUNTER — Ambulatory Visit: Payer: Medicare Other

## 2014-04-24 DIAGNOSIS — I639 Cerebral infarction, unspecified: Secondary | ICD-10-CM

## 2014-04-24 HISTORY — DX: Cerebral infarction, unspecified: I63.9

## 2014-05-07 ENCOUNTER — Emergency Department (HOSPITAL_COMMUNITY): Payer: Medicare Other

## 2014-05-07 ENCOUNTER — Encounter (HOSPITAL_COMMUNITY): Payer: Self-pay | Admitting: Emergency Medicine

## 2014-05-07 ENCOUNTER — Inpatient Hospital Stay (HOSPITAL_COMMUNITY)
Admission: EM | Admit: 2014-05-07 | Discharge: 2014-05-13 | DRG: 042 | Disposition: A | Discharge status: Skilled Nursing Facility | Payer: Medicare Other | Attending: Internal Medicine | Admitting: Internal Medicine

## 2014-05-07 DIAGNOSIS — Z823 Family history of stroke: Secondary | ICD-10-CM

## 2014-05-07 DIAGNOSIS — I6340 Cerebral infarction due to embolism of unspecified cerebral artery: Principal | ICD-10-CM | POA: Diagnosis present

## 2014-05-07 DIAGNOSIS — Z7902 Long term (current) use of antithrombotics/antiplatelets: Secondary | ICD-10-CM

## 2014-05-07 DIAGNOSIS — Z833 Family history of diabetes mellitus: Secondary | ICD-10-CM

## 2014-05-07 DIAGNOSIS — Z8249 Family history of ischemic heart disease and other diseases of the circulatory system: Secondary | ICD-10-CM

## 2014-05-07 DIAGNOSIS — I6529 Occlusion and stenosis of unspecified carotid artery: Secondary | ICD-10-CM

## 2014-05-07 DIAGNOSIS — R29898 Other symptoms and signs involving the musculoskeletal system: Secondary | ICD-10-CM | POA: Diagnosis not present

## 2014-05-07 DIAGNOSIS — K921 Melena: Secondary | ICD-10-CM

## 2014-05-07 DIAGNOSIS — Z79899 Other long term (current) drug therapy: Secondary | ICD-10-CM

## 2014-05-07 DIAGNOSIS — D353 Benign neoplasm of craniopharyngeal duct: Secondary | ICD-10-CM

## 2014-05-07 DIAGNOSIS — Z882 Allergy status to sulfonamides status: Secondary | ICD-10-CM

## 2014-05-07 DIAGNOSIS — I2510 Atherosclerotic heart disease of native coronary artery without angina pectoris: Secondary | ICD-10-CM

## 2014-05-07 DIAGNOSIS — K5900 Constipation, unspecified: Secondary | ICD-10-CM | POA: Diagnosis present

## 2014-05-07 DIAGNOSIS — I639 Cerebral infarction, unspecified: Secondary | ICD-10-CM

## 2014-05-07 DIAGNOSIS — K225 Diverticulum of esophagus, acquired: Secondary | ICD-10-CM

## 2014-05-07 DIAGNOSIS — M216X9 Other acquired deformities of unspecified foot: Secondary | ICD-10-CM | POA: Diagnosis present

## 2014-05-07 DIAGNOSIS — H9190 Unspecified hearing loss, unspecified ear: Secondary | ICD-10-CM

## 2014-05-07 DIAGNOSIS — K922 Gastrointestinal hemorrhage, unspecified: Secondary | ICD-10-CM

## 2014-05-07 DIAGNOSIS — G609 Hereditary and idiopathic neuropathy, unspecified: Secondary | ICD-10-CM

## 2014-05-07 DIAGNOSIS — Z91041 Radiographic dye allergy status: Secondary | ICD-10-CM

## 2014-05-07 DIAGNOSIS — Z888 Allergy status to other drugs, medicaments and biological substances status: Secondary | ICD-10-CM

## 2014-05-07 DIAGNOSIS — I739 Peripheral vascular disease, unspecified: Secondary | ICD-10-CM

## 2014-05-07 DIAGNOSIS — Z860100 Personal history of colon polyps, unspecified: Secondary | ICD-10-CM

## 2014-05-07 DIAGNOSIS — I4891 Unspecified atrial fibrillation: Secondary | ICD-10-CM | POA: Diagnosis present

## 2014-05-07 DIAGNOSIS — Z9089 Acquired absence of other organs: Secondary | ICD-10-CM

## 2014-05-07 DIAGNOSIS — D352 Benign neoplasm of pituitary gland: Secondary | ICD-10-CM | POA: Diagnosis present

## 2014-05-07 DIAGNOSIS — Z8719 Personal history of other diseases of the digestive system: Secondary | ICD-10-CM

## 2014-05-07 DIAGNOSIS — Z880 Allergy status to penicillin: Secondary | ICD-10-CM

## 2014-05-07 DIAGNOSIS — G608 Other hereditary and idiopathic neuropathies: Secondary | ICD-10-CM | POA: Diagnosis present

## 2014-05-07 DIAGNOSIS — I658 Occlusion and stenosis of other precerebral arteries: Secondary | ICD-10-CM | POA: Diagnosis present

## 2014-05-07 DIAGNOSIS — I779 Disorder of arteries and arterioles, unspecified: Secondary | ICD-10-CM

## 2014-05-07 DIAGNOSIS — I10 Essential (primary) hypertension: Secondary | ICD-10-CM

## 2014-05-07 HISTORY — DX: Cerebral infarction, unspecified: I63.9

## 2014-05-07 LAB — CBC
HEMATOCRIT: 41.2 % (ref 39.0–52.0)
Hemoglobin: 13.8 g/dL (ref 13.0–17.0)
MCH: 29.4 pg (ref 26.0–34.0)
MCHC: 33.5 g/dL (ref 30.0–36.0)
MCV: 87.8 fL (ref 78.0–100.0)
Platelets: 187 10*3/uL (ref 150–400)
RBC: 4.69 MIL/uL (ref 4.22–5.81)
RDW: 14 % (ref 11.5–15.5)
WBC: 6.7 10*3/uL (ref 4.0–10.5)

## 2014-05-07 LAB — POC OCCULT BLOOD, ED: Fecal Occult Bld: NEGATIVE

## 2014-05-07 LAB — BASIC METABOLIC PANEL
Anion gap: 12 (ref 5–15)
BUN: 22 mg/dL (ref 6–23)
CHLORIDE: 105 meq/L (ref 96–112)
CO2: 24 meq/L (ref 19–32)
Calcium: 9.2 mg/dL (ref 8.4–10.5)
Creatinine, Ser: 1.05 mg/dL (ref 0.50–1.35)
GFR calc Af Amer: 70 mL/min — ABNORMAL LOW (ref >90)
GFR, EST NON AFRICAN AMERICAN: 61 mL/min — AB (ref >90)
GLUCOSE: 93 mg/dL (ref 70–99)
POTASSIUM: 4.9 meq/L (ref 3.7–5.3)
Sodium: 141 mEq/L (ref 137–147)

## 2014-05-07 NOTE — ED Provider Notes (Signed)
CSN: [id number]     Arrival date & time 05/07/14  1440 History   None    Chief Complaint  Patient presents with  . Stroke Symptoms     (Consider location/radiation/quality/duration/timing/severity/associated sxs/prior Treatment) HPI 80 year old male with past medical history of neuropathy that presents with sudden onset of lower extremity weakness. Patient denies any recent URI illness or recent GI illness. States this morning when he woke up he was unable to use his legs for the severe weakness. At baseline the patient is able to walk around his apartment had no symptoms before bed last night denies any nausea vomiting recent trauma headache or vision changes. Patient has had recent back pain for last 4 days described as minimal cramping pain that is midline. Patient denies rectal incontinence, urinary incontinence, urinary retention fevers or chills.  Past Medical History  Diagnosis Date  . Hypertension   . Irregular heart beat   . Rectal bleed   . Mallory-Weiss tear 2011    treated with 6 units PRBCs. No longer on aspirin  . Carotid artery occlusion   . Zenker diverticulum   . Colon polyps   . Gastric hemorrhage   . CAD (coronary artery disease)   . Hearing loss   . Peripheral neuropathy    Past Surgical History  Procedure Laterality Date  . Back surgery    . Appendectomy    . Abdominal surgery    . Hernia repair     Family History  Problem Relation Age of Onset  . Cancer Mother     Deceased, 17  . Heart attack Father     Deceased, 77  . Heart disease Sister     Heart Disease before age 33  . Diabetes Son   . Parkinson's disease Sister   . Stroke Other     Deceased, siblings  . Heart disease Other     Deceased, siblings   History  Substance Use Topics  . Smoking status: Never Smoker   . Smokeless tobacco: Never Used  . Alcohol Use: No    Review of Systems  Constitutional: Negative for activity change.  HENT: Negative for congestion.   Eyes: Negative for  visual disturbance.  Respiratory: Negative for cough and shortness of breath.   Cardiovascular: Negative for chest pain and leg swelling.  Gastrointestinal: Negative for abdominal pain and blood in stool.  Genitourinary: Negative for dysuria and hematuria.  Musculoskeletal: Negative for back pain.  Skin: Negative for color change.  Neurological: Positive for weakness. Negative for syncope and headaches.  Psychiatric/Behavioral: Negative for agitation.      Allergies  Codeine; Ivp dye; Penicillins; Rofecoxib; and Sulfonamide derivatives  Home Medications   Prior to Admission medications   Medication Sig Start Date End Date Taking? Authorizing Provider  carvedilol (COREG) 3.125 MG tablet Take 1 tablet (3.125 mg total) by mouth 2 (two) times daily. 01/18/14   Thayer Headings, MD  Cholecalciferol (VITAMIN D) 2000 UNITS tablet Take 2,000 Units by mouth daily.    Historical Provider, MD  Coenzyme Q10 (COQ10) 200 MG CAPS Take 200 mg by mouth daily.    Historical Provider, MD  lisinopril (PRINIVIL,ZESTRIL) 20 MG tablet Take 1 tablet (20 mg total) by mouth daily. TAKE 1 TABLET BY MOUTH DAILY. 01/07/14   Thayer Headings, MD  Multiple Vitamin (MULTIVITAMIN WITH MINERALS) TABS tablet Take 1 tablet by mouth 2 (two) times daily. NutraLite    Historical Provider, MD  OMEGA 3-6-9 FATTY ACIDS PO Take 1 capsule by  mouth daily.    Historical Provider, MD  vitamin C (ASCORBIC ACID) 500 MG tablet Take 250-500 mg by mouth 2 (two) times daily. Take 1/2 tablet (250 mg) every morning and 1 tablet (500 mg) every evening    Historical Provider, MD  VITAMIN E PO Take 1 capsule by mouth daily.    Historical Provider, MD   There were no vitals taken for this visit. Physical Exam  Nursing note and vitals reviewed. Constitutional: He is oriented to person, place, and time. He appears well-developed and well-nourished.  HENT:  Head: Normocephalic.  Eyes: Pupils are equal, round, and reactive to light.  Neck: Neck  supple.  Cardiovascular: Normal rate and regular rhythm.  Exam reveals no gallop and no friction rub.   No murmur heard. Pulmonary/Chest: Effort normal. No respiratory distress.  Abdominal: Soft. He exhibits no distension. There is no tenderness.  Genitourinary: Guaiac negative stool.  Good rectal, patient states unable to squeeze  Musculoskeletal: He exhibits no edema and no tenderness.  Extreme weakness of the bilateral lower extremities. Patient unable to dorsiflex or plantar flex sensation is intact pulses intact.  Neurological: He is alert and oriented to person, place, and time.  Skin: Skin is warm.  Psychiatric: He has a normal mood and affect.    ED Course  Procedures (including critical care time) Labs Review Labs Reviewed  BASIC METABOLIC PANEL - Abnormal; Notable for the following:    GFR calc non Af Amer 61 (*)    GFR calc Af Amer 70 (*)    All other components within normal limits  COMPREHENSIVE METABOLIC PANEL - Abnormal; Notable for the following:    GFR calc non Af Amer 61 (*)    GFR calc Af Amer 70 (*)    All other components within normal limits  HEMOGLOBIN A1C - Abnormal; Notable for the following:    Hemoglobin A1C 5.9 (*)    Mean Plasma Glucose 123 (*)    All other components within normal limits  LIPID PANEL - Abnormal; Notable for the following:    HDL 28 (*)    All other components within normal limits  CBC  CBC  PROTIME-INR  POC OCCULT BLOOD, ED    Imaging Review Mr Jodene Nam Head Wo Contrast  05/08/2014   CLINICAL DATA:  Hypertension. A written may have. Bilateral leg weakness. Unable to walk.  EXAM: MRI HEAD WITHOUT CONTRAST  MRA HEAD WITHOUT CONTRAST  TECHNIQUE: Multiplanar, multiecho pulse sequences of the brain and surrounding structures were obtained without intravenous contrast. Angiographic images of the head were obtained using MRA technique without contrast.  COMPARISON:  Head CT 01/15/2009  FINDINGS: MRI HEAD FINDINGS  Diffusion imaging shows  several sub cm foci of acute cortical infarction in the left medial parietal lobe towards the vertex. No large vessel territory infarction. No significant swelling. No hemorrhage.  Elsewhere, there are mild chronic small-vessel changes of the pons. No focal cerebellar insult. There are old small vessel ischemic changes affecting the thalami and the cerebral hemispheric white matter. No mass lesion, hydrocephalus or extra-axial collection. There appears to be a pituitary mass on the left side measuring 11 x 12 mm. This was not primarily or completely evaluated. No skull or skullbase lesion.  MRA HEAD FINDINGS  Both internal carotid arteries are widely patent into the brain. The anterior and middle cerebral vessels are patent but show some diffuse atherosclerotic irregularity. No evidence of correctable stenosis. The right vertebral artery is widely patent to the basilar and  is dominant. The nondominant left vertebral artery shows a severe stenosis just proximal to the basilar. No basilar stenosis. Posterior circulation branch vessels are patent but show atherosclerotic irregularity.  IMPRESSION: Clustered areas of small cortical infarctions in the medial left parietal lobe at the vertex consistent with small infarctions in a left MCA branch vessel territory. No hemorrhage or swelling.  Chronic small vessel ischemic changes elsewhere throughout the brain.  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 This was not primarily or completely evaluated.  No major vessel occlusion. Diffuse intracranial irregularity of the medium size vessels consistent with diffuse atherosclerosis. Severe stenosis of the distal left vertebral artery just proximal to the basilar.   Electronically Signed   By: Nelson Chimes M.D.   On: 05/08/2014 11:25   [name]  05/08/2014   CLINICAL DATA:  Hypertension. A written may have. Bilateral leg weakness. Unable to walk.  EXAM: MRI HEAD WITHOUT CONTRAST  MRA HEAD  WITHOUT CONTRAST  TECHNIQUE: Multiplanar, multiecho pulse sequences of the brain and surrounding structures were obtained without intravenous contrast. Angiographic images of the head were obtained using MRA technique without contrast.  COMPARISON:  Head CT 01/15/2009  FINDINGS: MRI HEAD FINDINGS  Diffusion imaging shows several sub cm foci of acute cortical infarction in the left medial parietal lobe towards the vertex. No large vessel territory infarction. No significant swelling. No hemorrhage.  Elsewhere, there are mild chronic small-vessel changes of the pons. No focal cerebellar insult. There are old small vessel ischemic changes affecting the thalami and the cerebral hemispheric white matter. No mass lesion, hydrocephalus or extra-axial collection. There appears to be a pituitary mass on the left side measuring 11 x 12 mm. This was not primarily or completely evaluated. No skull or skullbase lesion.  MRA HEAD FINDINGS  Both internal carotid arteries are widely patent into the brain. The anterior and middle cerebral vessels are patent but show some diffuse atherosclerotic irregularity. No evidence of correctable stenosis. The right vertebral artery is widely patent to the basilar and is dominant. The nondominant left vertebral artery shows a severe stenosis just proximal to the basilar. No basilar stenosis. Posterior circulation branch vessels are patent but show atherosclerotic irregularity.  IMPRESSION: Clustered areas of small cortical infarctions in the medial left parietal lobe at the vertex consistent with small infarctions in a left MCA branch vessel territory. No hemorrhage or swelling.  Chronic small vessel ischemic changes elsewhere throughout the brain.  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 This was not primarily or completely evaluated.  No major vessel occlusion. Diffuse intracranial irregularity of the medium size vessels consistent with diffuse atherosclerosis.  Severe stenosis of the distal left vertebral artery just proximal to the basilar.   Electronically Signed   By: Nelson Chimes M.D.   On: 05/08/2014 11:25   Mr Thoracic Spine Wo Contrast  05/07/2014   CLINICAL DATA:  Lower extremity weakness.  EXAM: MRI THORACIC SPINE WITHOUT CONTRAST  TECHNIQUE: Multiplanar, multisequence MR imaging of the thoracic spine was performed. No intravenous contrast was administered.  COMPARISON:  Prior MRI of the lumbar spine performed earlier on the same day.  FINDINGS: The vertebral bodies are normally aligned with preservation of the normal thoracic kyphosis. Vertebral body heights are well preserved. No fracture or listhesis.  Mild multilevel degenerative intervertebral disc space narrowing seen throughout the thoracic spine with associated chronic endplate Schmorl's nodes. No significant degenerative disc bulge  identified. No focal disc protrusions. Mild multilevel facet arthropathy noted. No central canal stenosis or cord compression. No significant foraminal narrowing identified.  The visualized paraspinous soft tissues are within normal limits. Visualized lungs are clear.  IMPRESSION: No acute abnormality identified within the thoracic spine. No evidence of cord compression or other intrinsic spinal cord abnormality.   Electronically Signed   By: Jeannine Boga M.D.   On: 05/07/2014 23:38   Mr Lumbar Spine Wo Contrast  05/07/2014   CLINICAL DATA:  Sudden onset bilateral lower extremity weakness.  EXAM: MRI LUMBAR SPINE WITHOUT CONTRAST  TECHNIQUE: Multiplanar, multisequence MR imaging of the lumbar spine was performed. No intravenous contrast was administered.  COMPARISON:  03/10/2012  FINDINGS: Vertebral alignment is normal. Vertebral body heights are preserved without compression fracture. Moderate disc space narrowing and associated degenerative marrow changes are again seen at L4-5. Right-sided pars defect is again noted at L5. 10 mm T1 and T2 hypointense lesion in  the L3 vertebral body is unchanged, sclerotic on prior CT. The conus medullaris is normal in signal and terminates at L1. Small T2 hyperintense lesion arising from the upper pole of the left kidney is unchanged, most likely a cyst.  T12-L1 and L1-2:  Negative.  L2-3: Mild disc bulge and facet and ligamentum flavum hypertrophy without spinal canal or neural foraminal stenosis, unchanged.  L3-4: Mild disc bulge and facet and ligamentum flavum hypertrophy result in mild spinal stenosis, mild left greater the right lateral recess stenosis, and mild left neural foraminal narrowing, unchanged.  L4-5: Prior laminectomies with decompressed spinal canal. Disc bulging and spurring asymmetric to the right and mild facet and ligamentum flavum hypertrophy result in mild right neural foraminal stenosis, unchanged.  L5-S1: Mild facet hypertrophy result in mild left greater than right neural foraminal narrowing without spinal stenosis, unchanged.  IMPRESSION: Unchanged appearance of the lumbar spine without acute abnormality identified. Moderate multilevel spondylosis resulting in mild spinal canal and lateral recess stenosis at L3-4 and mild right neural foraminal stenosis at L4-5.   Electronically Signed   By: Logan Bores   On: 05/07/2014 19:04     EKG Interpretation   Date/Time:  Tuesday May 07 2014 15:09:28 EDT Ventricular Rate:  61 PR Interval:  186 QRS Duration: 98 QT Interval:  423 QTC Calculation: 426 R Axis:   59 Text Interpretation:  Age not entered, assumed to be  80 years old for  purpose of ECG interpretation Sinus rhythm ED PHYSICIAN INTERPRETATION  AVAILABLE IN [facility] Confirmed by TEST, Record ([id number]) on  05/09/2014 7:39:26 AM      MDM   Final diagnoses:  Weakness of both lower extremities  Unspecified essential hypertension  Unspecified hereditary and idiopathic peripheral neuropathy  Coronary atherosclerosis of unspecified type of vessel, native or graft  Peripheral vascular  disease, unspecified  Cerebral infarction due to unspecified mechanism    80 y/o male with acute onset of bilateral lower extremity weakness without notable cause. No recent trauma, minor back pain no fevers, no sensation loss. Patient evaluated with L spine MRI and noted to not have acute findings to explain deficit. Patient discussed with neurology and they came to consult. They suggested T spine MRI and admission to the medicine service. The patient was admitted and remained in stable condition.      Claudean Severance, MD 05/09/14 [phone]

## 2014-05-07 NOTE — Consult Note (Signed)
Reason for Consult: Bilateral lower extremity weakness.  HPI:                                                                                                                                          Manuel [name] is an 80 y.o. male history of hypertension, coronary artery disease, carotid occlusion and peripheral neuropathy, presenting with new onset weakness involving lower extremities. Patient is usually ambulatory independently at home. He woke up this morning with inability to move his lower extremities. He said no problems with control of urination. There has been no loss of sensation in lower extremities. He said no symptoms involving his upper extremities. He's been experiencing lower back pain in the sacral region. MRI of his lumbar spine showed multilevel degenerative changes but no acute abnormality. Patient's strength has been improving throughout the day with noticeable return of ability to move his feet as well as ability to at least slightly lift his legs.  Past Medical History  Diagnosis Date  . Hypertension   . Irregular heart beat   . Rectal bleed   . Mallory-Weiss tear 2011    treated with 6 units PRBCs. No longer on aspirin  . Carotid artery occlusion   . Zenker diverticulum   . Colon polyps   . Gastric hemorrhage   . CAD (coronary artery disease)   . Hearing loss   . Peripheral neuropathy     Past Surgical History  Procedure Laterality Date  . Back surgery    . Appendectomy    . Abdominal surgery    . Hernia repair      Family History  Problem Relation Age of Onset  . Cancer Mother     Deceased, 47  . Heart attack Father     Deceased, 40  . Heart disease Sister     Heart Disease before age 12  . Diabetes Son   . Parkinson's disease Sister   . Stroke Other     Deceased, siblings  . Heart disease Other     Deceased, siblings    Social History:  reports that he has never smoked. He has never used smokeless tobacco. He reports that he does not drink  alcohol or use illicit drugs.  Allergies  Allergen Reactions  . Adhesive [Tape]     Plastic tape= tears skin  . Codeine Other (See Comments)    Pass out  . Ivp Dye [Iodinated Diagnostic Agents] Other (See Comments)    Doesn't remember reaction but it was severe  . Penicillins Hives and Itching  . Rofecoxib Other (See Comments)     elevated BP (Vioxx)  . Sulfonamide Derivatives Hives and Itching    MEDICATIONS:  I have reviewed the patient's current medications.   ROS:                                                                                                                                       History obtained from spouse and the patient  General ROS: negative for - chills, fatigue, fever, night sweats, weight gain or weight loss Psychological ROS: negative for - behavioral disorder, hallucinations, memory difficulties, mood swings or suicidal ideation Ophthalmic ROS: negative for - blurry vision, double vision, eye pain or loss of vision ENT ROS: negative for - epistaxis, nasal discharge, oral lesions, sore throat, tinnitus or vertigo Allergy and Immunology ROS: negative for - hives or itchy/watery eyes Hematological and Lymphatic ROS: negative for - bleeding problems, bruising or swollen lymph nodes Endocrine ROS: negative for - galactorrhea, hair pattern changes, polydipsia/polyuria or temperature intolerance Respiratory ROS: negative for - cough, hemoptysis, shortness of breath or wheezing Cardiovascular ROS: negative for - chest pain, dyspnea on exertion, edema or irregular heartbeat Gastrointestinal ROS: negative for - abdominal pain, diarrhea, hematemesis, nausea/vomiting or stool incontinence Genito-Urinary ROS: negative for - dysuria, hematuria, incontinence or urinary frequency/urgency Musculoskeletal ROS: negative for - joint swelling or muscular  weakness Neurological ROS: as noted in HPI Dermatological ROS: negative for rash and skin lesion changes   Blood pressure 179/78, pulse 59, temperature 97.7 F (36.5 C), temperature source Oral, resp. rate 17, SpO2 95.00%.   Neurologic Examination:                                                                                                      Mental Status: Alert, oriented, thought content appropriate.  Speech fluent without evidence of aphasia. Able to follow commands without difficulty. Cranial Nerves: II-Visual fields were normal. III/IV/VI-Pupils were equal and reacted. Extraocular movements were full and conjugate.    V/VII-no facial numbness and no facial weakness. VIII-normal. X-normal speech and symmetrical palatal movement. Motor: Mild intrinsic hand muscle weakness with mild to moderate atrophy bilaterally; no proximal upper extremity weakness; mild weakness (4/5 strength) left hip flexors and quadriceps muscles bilaterally; 1/5 strength of dorsiflexors of the feet and 0/5 strength of plantar flexors. Sensory: Normal throughout. Deep Tendon Reflexes: Trace to 1+ and symmetric in upper extremities and absent knees and ankles. Plantars: Mute bilaterally bilaterally Cerebellar: Normal finger-to-nose testing.  No results found for this basename: cbc, bmp, coags, chol, tri, ldl, hga1c    Results for orders placed during the hospital encounter of 05/07/14 (  from the past 48 hour(s))  CBC     Status: None   Collection Time    05/07/14  3:31 PM      Result Value Ref Range   WBC 6.7  4.0 - 10.5 K/uL   RBC 4.69  4.22 - 5.81 MIL/uL   Hemoglobin 13.8  13.0 - 17.0 g/dL   HCT 41.2  39.0 - 52.0 %   MCV 87.8  78.0 - 100.0 fL   MCH 29.4  26.0 - 34.0 pg   MCHC 33.5  30.0 - 36.0 g/dL   RDW 14.0  11.5 - 15.5 %   Platelets 187  150 - 400 K/uL  BASIC METABOLIC PANEL     Status: Abnormal   Collection Time    05/07/14  3:31 PM      Result Value Ref Range   Sodium 141  137 - 147  mEq/L   Potassium 4.9  3.7 - 5.3 mEq/L   Chloride 105  96 - 112 mEq/L   CO2 24  19 - 32 mEq/L   Glucose, Bld 93  70 - 99 mg/dL   BUN 22  6 - 23 mg/dL   Creatinine, Ser 1.05  0.50 - 1.35 mg/dL   Calcium 9.2  8.4 - 10.5 mg/dL   GFR calc non Af Amer 61 (*) >90 mL/min   GFR calc Af Amer 70 (*) >90 mL/min   Comment: (NOTE)     The eGFR has been calculated using the CKD EPI equation.     This calculation has not been validated in all clinical situations.     eGFR's persistently <90 mL/min signify possible Chronic Kidney     Disease.   Anion gap 12  5 - 15  POC OCCULT BLOOD, ED     Status: None   Collection Time    05/07/14  3:57 PM      Result Value Ref Range   Fecal Occult Bld NEGATIVE  NEGATIVE    Mr Lumbar Spine Wo Contrast  05/07/2014   CLINICAL DATA:  Sudden onset bilateral lower extremity weakness.  EXAM: MRI LUMBAR SPINE WITHOUT CONTRAST  TECHNIQUE: Multiplanar, multisequence MR imaging of the lumbar spine was performed. No intravenous contrast was administered.  COMPARISON:  03/10/2012  FINDINGS: Vertebral alignment is normal. Vertebral body heights are preserved without compression fracture. Moderate disc space narrowing and associated degenerative marrow changes are again seen at L4-5. Right-sided pars defect is again noted at L5. 10 mm T1 and T2 hypointense lesion in the L3 vertebral body is unchanged, sclerotic on prior CT. The conus medullaris is normal in signal and terminates at L1. Small T2 hyperintense lesion arising from the upper pole of the left kidney is unchanged, most likely a cyst.  T12-L1 and L1-2:  Negative.  L2-3: Mild disc bulge and facet and ligamentum flavum hypertrophy without spinal canal or neural foraminal stenosis, unchanged.  L3-4: Mild disc bulge and facet and ligamentum flavum hypertrophy result in mild spinal stenosis, mild left greater the right lateral recess stenosis, and mild left neural foraminal narrowing, unchanged.  L4-5: Prior laminectomies with  decompressed spinal canal. Disc bulging and spurring asymmetric to the right and mild facet and ligamentum flavum hypertrophy result in mild right neural foraminal stenosis, unchanged.  L5-S1: Mild facet hypertrophy result in mild left greater than right neural foraminal narrowing without spinal stenosis, unchanged.  IMPRESSION: Unchanged appearance of the lumbar spine without acute abnormality identified. Moderate multilevel spondylosis resulting in mild spinal canal and lateral recess  stenosis at L3-4 and mild right neural foraminal stenosis at L4-5.   Electronically Signed   By: Logan Bores   On: 05/07/2014 19:04   Assessment/Plan: 80 year old man presenting with weakness of both lower extremities of unclear etiology. He has known peripheral neuropathy and weakness distally in both lower extremities. Exam shows mild proximal weakness as well as marked distal weakness of his lower extremities. MRI of his lumbar spine showed no acute changes. Lower extremity strength has been improving.  Recommendations: 1. MRI of thoracic spine to rule out cord compression or acute intrinsic cord lesion. 2. Physical therapy consultation for evaluation of lower extremity weakness and gait stability. 3. No specific treatment intervention is warranted unless MRI of thoracic spine is normal.  We will continue to follow this patient with you.  C.R. Nicole Kindred, MD [facility] [phone]  05/07/2014, 9:05 PM

## 2014-05-07 NOTE — H&P (Signed)
[facility] History and Physical  Patient: Manuel [name]  ERD:[id number]  DOB: 06-17-1922  DOS: the patient was seen and examined on 05/07/2014 PCP: Shirline Frees, MD  Chief Complaint:  leg weakness  HPI: Manuel [name] is a 80 y.o. male with Past medical history of hypertension, irregular heartbeat, coronary artery disease, peripheral neuropathy. Patient presented with complaints of bilateral leg weakness. When he woke up this morning he started having this weakness and he was not able to lift himself from the bed to the restroom. At his baseline he mentions he has been walking with a walker and a cane without any difficulty. He has history of bilateral foot drop and has an extensive neuropathy workup for the same. He mentions he has been having some lower back pain since last 3-4 days without any trauma or injury. He denies any fever or chills diarrhea or constipation. He denies any burning urination incontinence of bowel or bladder. He denies any numbness in his legs. He mentions his weakness is gradually improving.  The patient is coming from home. And at his baseline independent for most of his ADL.  Review of Systems: as mentioned in the history of present illness.  A Comprehensive review of the other systems is negative.  Past Medical History  Diagnosis Date  . Hypertension   . Irregular heart beat   . Rectal bleed   . Mallory-Weiss tear 2011    treated with 6 units PRBCs. No longer on aspirin  . Carotid artery occlusion   . Zenker diverticulum   . Colon polyps   . Gastric hemorrhage   . CAD (coronary artery disease)   . Hearing loss   . Peripheral neuropathy    Past Surgical History  Procedure Laterality Date  . Back surgery    . Appendectomy    . Abdominal surgery    . Hernia repair     Social History:  reports that he has never smoked. He has never used smokeless tobacco. He reports that he does not drink alcohol or use illicit drugs.  Allergies   Allergen Reactions  . Adhesive [Tape]     Plastic tape= tears skin  . Codeine Other (See Comments)    Pass out  . Ivp Dye [Iodinated Diagnostic Agents] Other (See Comments)    Doesn't remember reaction but it was severe  . Penicillins Hives and Itching  . Rofecoxib Other (See Comments)     elevated BP (Vioxx)  . Sulfonamide Derivatives Hives and Itching    Family History  Problem Relation Age of Onset  . Cancer Mother     Deceased, 83  . Heart attack Father     Deceased, 63  . Heart disease Sister     Heart Disease before age 35  . Diabetes Son   . Parkinson's disease Sister   . Stroke Other     Deceased, siblings  . Heart disease Other     Deceased, siblings    Prior to Admission medications   Medication Sig Start Date End Date Taking? Authorizing Provider  acetaminophen (TYLENOL) 650 MG CR tablet Take 650 mg by mouth every 8 (eight) hours as needed for pain (give with docusate for stool softner).   Yes Historical Provider, MD  carvedilol (COREG) 3.125 MG tablet Take 1 tablet (3.125 mg total) by mouth 2 (two) times daily. 01/18/14  Yes Thayer Headings, MD  Cholecalciferol (VITAMIN D) 2000 UNITS tablet Take 2,000 Units by mouth daily.   Yes Historical Provider,  MD  Coenzyme Q10 (COQ10) 200 MG CAPS Take 200 mg by mouth daily.   Yes Historical Provider, MD  docusate sodium (COLACE) 100 MG capsule Take 100 mg by mouth daily as needed for mild constipation (give with tylenol arthritis for a stool softner).   Yes Historical Provider, MD  lisinopril (PRINIVIL,ZESTRIL) 20 MG tablet Take 1 tablet (20 mg total) by mouth daily. TAKE 1 TABLET BY MOUTH DAILY. 01/07/14  Yes Thayer Headings, MD  Multiple Vitamin (MULTIVITAMIN WITH MINERALS) TABS tablet Take 1 tablet by mouth daily. NutraLite   Yes Historical Provider, MD  OMEGA 3-6-9 FATTY ACIDS PO Take 1 capsule by mouth daily.   Yes Historical Provider, MD  vitamin C (ASCORBIC ACID) 500 MG tablet Take 250 mg by mouth at bedtime.    Yes  Historical Provider, MD  vitamin E 400 UNIT capsule Take 400 Units by mouth daily.   Yes Historical Provider, MD    Physical Exam: Filed Vitals:   05/07/14 1915 05/07/14 2007 05/07/14 2030 05/07/14 2133  BP: 176/64 185/82 179/78 164/67  Pulse: 71 58 59   Temp:      TempSrc:      Resp: 18 16 17    SpO2: 95% 97% 95%     General: Alert, Awake and Oriented to Time, Place and Person. Appear in mild distress Eyes: PERRL ENT: Oral Mucosa clear moist. Neck: no JVD Cardiovascular: S1 and S2 Present, no Murmur, Peripheral Pulses Present Respiratory: Bilateral Air entry equal and Decreased, Clear to Auscultation, noCrackles, no wheezes Abdomen: Bowel Sound Present, Soft and Non tender Skin: no Rash Extremities: Trace Pedal edema, no calf tenderness Neurologic: Grossly no focal neuro deficit. Bilateral weakness of ankle musculature with foot drop, good strength in knee and hip. Straight leg raising is negative for any back pain patient had pain in front of the thigh all times.  Labs on Admission:  CBC:  Recent Labs Lab 05/07/14 1531  WBC 6.7  HGB 13.8  HCT 41.2  MCV 87.8  PLT 187    CMP     Component Value Date/Time   NA 141 05/07/2014 1531   K 4.9 05/07/2014 1531   CL 105 05/07/2014 1531   CO2 24 05/07/2014 1531   GLUCOSE 93 05/07/2014 1531   BUN 22 05/07/2014 1531   CREATININE 1.05 05/07/2014 1531   CALCIUM 9.2 05/07/2014 1531   PROT 6.9 03/10/2012 2103   ALBUMIN 3.6 03/10/2012 2103   AST 20 03/10/2012 2103   ALT 13 03/10/2012 2103   ALKPHOS 56 03/10/2012 2103   BILITOT 0.2* 03/10/2012 2103   GFRNONAA 61* 05/07/2014 1531   GFRAA 70* 05/07/2014 1531    No results found for this basename: LIPASE, AMYLASE,  in the last 168 hours No results found for this basename: AMMONIA,  in the last 168 hours  No results found for this basename: CKTOTAL, CKMB, CKMBINDEX, TROPONINI,  in the last 168 hours BNP (last 3 results) No results found for this basename: PROBNP,  in the last 8760  hours  Radiological Exams on Admission: Mr Thoracic Spine Wo Contrast  05/07/2014   CLINICAL DATA:  Lower extremity weakness.  EXAM: MRI THORACIC SPINE WITHOUT CONTRAST  TECHNIQUE: Multiplanar, multisequence MR imaging of the thoracic spine was performed. No intravenous contrast was administered.  COMPARISON:  Prior MRI of the lumbar spine performed earlier on the same day.  FINDINGS: The vertebral bodies are normally aligned with preservation of the normal thoracic kyphosis. Vertebral body heights are well preserved. No  fracture or listhesis.  Mild multilevel degenerative intervertebral disc space narrowing seen throughout the thoracic spine with associated chronic endplate Schmorl's nodes. No significant degenerative disc bulge identified. No focal disc protrusions. Mild multilevel facet arthropathy noted. No central canal stenosis or cord compression. No significant foraminal narrowing identified.  The visualized paraspinous soft tissues are within normal limits. Visualized lungs are clear.  IMPRESSION: No acute abnormality identified within the thoracic spine. No evidence of cord compression or other intrinsic spinal cord abnormality.   Electronically Signed   By: Jeannine Boga M.D.   On: 05/07/2014 23:38   Mr Lumbar Spine Wo Contrast  05/07/2014   CLINICAL DATA:  Sudden onset bilateral lower extremity weakness.  EXAM: MRI LUMBAR SPINE WITHOUT CONTRAST  TECHNIQUE: Multiplanar, multisequence MR imaging of the lumbar spine was performed. No intravenous contrast was administered.  COMPARISON:  03/10/2012  FINDINGS: Vertebral alignment is normal. Vertebral body heights are preserved without compression fracture. Moderate disc space narrowing and associated degenerative marrow changes are again seen at L4-5. Right-sided pars defect is again noted at L5. 10 mm T1 and T2 hypointense lesion in the L3 vertebral body is unchanged, sclerotic on prior CT. The conus medullaris is normal in signal and terminates  at L1. Small T2 hyperintense lesion arising from the upper pole of the left kidney is unchanged, most likely a cyst.  T12-L1 and L1-2:  Negative.  L2-3: Mild disc bulge and facet and ligamentum flavum hypertrophy without spinal canal or neural foraminal stenosis, unchanged.  L3-4: Mild disc bulge and facet and ligamentum flavum hypertrophy result in mild spinal stenosis, mild left greater the right lateral recess stenosis, and mild left neural foraminal narrowing, unchanged.  L4-5: Prior laminectomies with decompressed spinal canal. Disc bulging and spurring asymmetric to the right and mild facet and ligamentum flavum hypertrophy result in mild right neural foraminal stenosis, unchanged.  L5-S1: Mild facet hypertrophy result in mild left greater than right neural foraminal narrowing without spinal stenosis, unchanged.  IMPRESSION: Unchanged appearance of the lumbar spine without acute abnormality identified. Moderate multilevel spondylosis resulting in mild spinal canal and lateral recess stenosis at L3-4 and mild right neural foraminal stenosis at L4-5.   Electronically Signed   By: Logan Bores   On: 05/07/2014 19:04    EKG: Independently reviewed. normal sinus rhythm, nonspecific ST and T waves changes. Assessment/Plan Principal Problem:   Bilateral leg weakness Active Problems:   PERIPHERAL NEUROPATHY   HYPERTENSION   CAD   Peripheral vascular disease, unspecified   Lower extremity weakness   1. Bilateral leg weakness Patient is complaining of bilateral leg weakness. MRI of thoracic and lumbar spine are negative for any acute abnormality. Patient's weakness is gradually improving.  His symptoms does not appear to be having an ascending fashion. Third he did be admitted to the hospital to monitor him on telemetry. Serial neuro checks. PTOT consultation in the morning.  2. Hypertension Continue home antihypertensive medications.  3. Constipation Continue Colace.  Consults: Neurology,  appreciate input  DVT Prophylaxis: subcutaneous Heparin Nutrition: Cardiac diet  Code Status: Full  Family Communication: Family was present at bedside, opportunity was given to ask question and all questions were answered satisfactorily at the time of interview. Disposition: Admitted to observation in telemetry unit.  Author: Berle Mull, MD [facility] Pager: [phone] 05/07/2014, 11:50 PM    If 7PM-7AM, please contact night-coverage [email or web address] Password [email or web address]  **Disclaimer: This note may have been dictated with voice recognition software. Similar sounding words can inadvertently be  transcribed and this note may contain transcription errors which may not have been corrected upon publication of note.**

## 2014-05-07 NOTE — ED Notes (Signed)
Patient being transported to MRI

## 2014-05-07 NOTE — ED Notes (Signed)
MD at bedside discussing results. 

## 2014-05-07 NOTE — ED Notes (Signed)
Pt remains in MRI 

## 2014-05-07 NOTE — ED Notes (Addendum)
Pt arrives stating he was unable to stand to get out bed this AM. Pt states he uses a wheelchair scooter some of the time to help him get around. THis AM was dependent on scooter to get out of bed. Pt report weakness is to both lower extremities. Denies recent fever or illness. Reports some back pain. Pt awake, alert, with new onset slurred speech, unable to lift lower extremities against gravity. No arm drift noted.

## 2014-05-07 NOTE — ED Notes (Signed)
Pt states that he woke up this morning and that he had increased weakness to bilateral legs. States he has constant weakness, but is normally able to walk but was to weak to do so. Wife states that initially she thought pt slurred a word when he woke up,but states that his speech has remained clear and that she hasn't noticed a facial droop. Wife states after breakfast, pt had right arm weakness that lasted appx 5-6 minutes.

## 2014-05-07 NOTE — ED Notes (Signed)
Patient still at MRI

## 2014-05-07 NOTE — ED Notes (Signed)
Resident at bedside.  

## 2014-05-07 NOTE — ED Notes (Signed)
Neurology at bedside.

## 2014-05-08 ENCOUNTER — Observation Stay (HOSPITAL_COMMUNITY): Payer: Medicare Other

## 2014-05-08 ENCOUNTER — Encounter (HOSPITAL_COMMUNITY): Payer: Self-pay | Admitting: *Deleted

## 2014-05-08 DIAGNOSIS — R29898 Other symptoms and signs involving the musculoskeletal system: Secondary | ICD-10-CM | POA: Diagnosis not present

## 2014-05-08 LAB — COMPREHENSIVE METABOLIC PANEL
ALK PHOS: 52 U/L (ref 39–117)
ALT: 11 U/L (ref 0–53)
ANION GAP: 15 (ref 5–15)
AST: 16 U/L (ref 0–37)
Albumin: 3.7 g/dL (ref 3.5–5.2)
BUN: 19 mg/dL (ref 6–23)
CO2: 22 mEq/L (ref 19–32)
CREATININE: 1.05 mg/dL (ref 0.50–1.35)
Calcium: 9.2 mg/dL (ref 8.4–10.5)
Chloride: 106 mEq/L (ref 96–112)
GFR calc non Af Amer: 61 mL/min — ABNORMAL LOW (ref >90)
GFR, EST AFRICAN AMERICAN: 70 mL/min — AB (ref >90)
GLUCOSE: 96 mg/dL (ref 70–99)
POTASSIUM: 4.5 meq/L (ref 3.7–5.3)
Sodium: 143 mEq/L (ref 137–147)
TOTAL PROTEIN: 6.8 g/dL (ref 6.0–8.3)
Total Bilirubin: 0.5 mg/dL (ref 0.3–1.2)

## 2014-05-08 LAB — PROTIME-INR
INR: 1.06 (ref 0.00–1.49)
PROTHROMBIN TIME: 13.8 s (ref 11.6–15.2)

## 2014-05-08 LAB — CBC
HCT: 40.7 % (ref 39.0–52.0)
Hemoglobin: 13.4 g/dL (ref 13.0–17.0)
MCH: 28.9 pg (ref 26.0–34.0)
MCHC: 32.9 g/dL (ref 30.0–36.0)
MCV: 87.7 fL (ref 78.0–100.0)
Platelets: 202 10*3/uL (ref 150–400)
RBC: 4.64 MIL/uL (ref 4.22–5.81)
RDW: 14.1 % (ref 11.5–15.5)
WBC: 6.6 10*3/uL (ref 4.0–10.5)

## 2014-05-08 MED ORDER — CARVEDILOL 3.125 MG PO TABS
3.1250 mg | ORAL_TABLET | Freq: Two times a day (BID) | ORAL | Status: DC
  Administered 2014-05-08 – 2014-05-12 (×8): 3.125 mg via ORAL
  Filled 2014-05-08 (×13): qty 1

## 2014-05-08 MED ORDER — ACETAMINOPHEN 650 MG RE SUPP: 650.0000 mg | Freq: Four times a day (QID) | RECTAL | Status: DC | PRN

## 2014-05-08 MED ORDER — HEPARIN SODIUM (PORCINE) 5000 UNIT/ML IJ SOLN
5000.0000 [IU] | Freq: Three times a day (TID) | INTRAMUSCULAR | Status: DC
  Administered 2014-05-08 – 2014-05-13 (×17): 5000 [IU] via SUBCUTANEOUS
  Filled 2014-05-08 (×19): qty 1

## 2014-05-08 MED ORDER — LISINOPRIL 20 MG PO TABS
20.0000 mg | ORAL_TABLET | Freq: Every day | ORAL | Status: DC
  Administered 2014-05-08 – 2014-05-13 (×6): 20 mg via ORAL
  Filled 2014-05-08 (×6): qty 1

## 2014-05-08 MED ORDER — ASPIRIN 325 MG PO TABS
325.0000 mg | ORAL_TABLET | Freq: Every day | ORAL | Status: DC
  Filled 2014-05-08 (×2): qty 1

## 2014-05-08 MED ORDER — DOCUSATE SODIUM 100 MG PO CAPS: 100.0000 mg | ORAL_CAPSULE | Freq: Every day | ORAL | Status: DC | PRN

## 2014-05-08 MED ORDER — ONDANSETRON HCL 4 MG/2ML IJ SOLN: 4.0000 mg | Freq: Four times a day (QID) | INTRAMUSCULAR | Status: DC | PRN

## 2014-05-08 MED ORDER — ONDANSETRON HCL 4 MG PO TABS: 4.0000 mg | ORAL_TABLET | Freq: Four times a day (QID) | ORAL | Status: DC | PRN

## 2014-05-08 MED ORDER — ACETAMINOPHEN 325 MG PO TABS
650.0000 mg | ORAL_TABLET | Freq: Four times a day (QID) | ORAL | Status: DC | PRN
  Administered 2014-05-09 – 2014-05-12 (×3): 650 mg via ORAL
  Administered 2014-05-13: 325 mg via ORAL
  Filled 2014-05-08 (×4): qty 2

## 2014-05-08 NOTE — Progress Notes (Signed)
NEURO HOSPITALIST PROGRESS NOTE   SUBJECTIVE:                                                                                                                        Some improvement in bilateral lower extremity weakness but still very insecure walking. No new neurological complains. MRI T-spine showed no cord involvement.  MRI L-spine: moderate multilevel spondylosis resulting in mild spinal canal and lateral recess stenosis at L3-4 and mild right neural foraminal stenosis at L4-5.  OBJECTIVE:                                                                                                                           Vital signs in last 24 hours: Temp:  [97.5 F (36.4 C)-97.7 F (36.5 C)] 97.7 F (36.5 C) (07/15 0537) Pulse Rate:  [55-71] 67 (07/15 0537) Resp:  [12-21] 18 (07/15 0537) BP: (164-195)/(58-82) 175/67 mmHg (07/15 0537) SpO2:  [95 %-100 %] 96 % (07/15 0537) Weight:  [88.905 kg (196 lb)] 88.905 kg (196 lb) (07/14 2352)  Intake/Output from previous day:   Intake/Output this shift:   Nutritional status: Sodium Restricted  Past Medical History  Diagnosis Date  . Hypertension   . Irregular heart beat   . Rectal bleed   . Mallory-Weiss tear 2011    treated with 6 units PRBCs. No longer on aspirin  . Carotid artery occlusion   . Zenker diverticulum   . Colon polyps   . Gastric hemorrhage   . CAD (coronary artery disease)   . Hearing loss   . Peripheral neuropathy    Neurologic Exam:  Mental Status:  Alert, oriented, thought content appropriate. Speech fluent without evidence of aphasia. Able to follow commands without difficulty.  Cranial Nerves:  II-Visual fields were normal.  III/IV/VI-Pupils were equal and reacted. Extraocular movements were full and conjugate.  V/VII-no facial numbness and no facial weakness.  VIII-normal.  X-normal speech and symmetrical palatal movement.  Motor: Mild intrinsic hand muscle weakness with  mild to moderate atrophy bilaterally; no proximal upper extremity weakness; mild weakness (4/5 strength) left hip flexors and quadriceps muscles bilaterally; 1/5 strength of dorsiflexors of the feet and 0/5 strength of plantar flexors.  Sensory: Normal throughout.  Deep Tendon  Reflexes: Trace to 1+ and symmetric in upper extremities and absent knees and ankles.  Plantars: Mute bilaterally bilaterally  Cerebellar: Normal finger-to-nose testing   Lab Results: No results found for this basename: cbc, bmp, coags, chol, tri, ldl, hga1c   Lipid Panel No results found for this basename: CHOL, TRIG, HDL, CHOLHDL, VLDL, LDLCALC,  in the last 72 hours  Studies/Results: Mr Thoracic Spine Wo Contrast  05/07/2014   CLINICAL DATA:  Lower extremity weakness.  EXAM: MRI THORACIC SPINE WITHOUT CONTRAST  TECHNIQUE: Multiplanar, multisequence MR imaging of the thoracic spine was performed. No intravenous contrast was administered.  COMPARISON:  Prior MRI of the lumbar spine performed earlier on the same day.  FINDINGS: The vertebral bodies are normally aligned with preservation of the normal thoracic kyphosis. Vertebral body heights are well preserved. No fracture or listhesis.  Mild multilevel degenerative intervertebral disc space narrowing seen throughout the thoracic spine with associated chronic endplate Schmorl's nodes. No significant degenerative disc bulge identified. No focal disc protrusions. Mild multilevel facet arthropathy noted. No central canal stenosis or cord compression. No significant foraminal narrowing identified.  The visualized paraspinous soft tissues are within normal limits. Visualized lungs are clear.  IMPRESSION: No acute abnormality identified within the thoracic spine. No evidence of cord compression or other intrinsic spinal cord abnormality.   Electronically Signed   By: Jeannine Boga M.D.   On: 05/07/2014 23:38   Mr Lumbar Spine Wo Contrast  05/07/2014   CLINICAL DATA:  Sudden  onset bilateral lower extremity weakness.  EXAM: MRI LUMBAR SPINE WITHOUT CONTRAST  TECHNIQUE: Multiplanar, multisequence MR imaging of the lumbar spine was performed. No intravenous contrast was administered.  COMPARISON:  03/10/2012  FINDINGS: Vertebral alignment is normal. Vertebral body heights are preserved without compression fracture. Moderate disc space narrowing and associated degenerative marrow changes are again seen at L4-5. Right-sided pars defect is again noted at L5. 10 mm T1 and T2 hypointense lesion in the L3 vertebral body is unchanged, sclerotic on prior CT. The conus medullaris is normal in signal and terminates at L1. Small T2 hyperintense lesion arising from the upper pole of the left kidney is unchanged, most likely a cyst.  T12-L1 and L1-2:  Negative.  L2-3: Mild disc bulge and facet and ligamentum flavum hypertrophy without spinal canal or neural foraminal stenosis, unchanged.  L3-4: Mild disc bulge and facet and ligamentum flavum hypertrophy result in mild spinal stenosis, mild left greater the right lateral recess stenosis, and mild left neural foraminal narrowing, unchanged.  L4-5: Prior laminectomies with decompressed spinal canal. Disc bulging and spurring asymmetric to the right and mild facet and ligamentum flavum hypertrophy result in mild right neural foraminal stenosis, unchanged.  L5-S1: Mild facet hypertrophy result in mild left greater than right neural foraminal narrowing without spinal stenosis, unchanged.  IMPRESSION: Unchanged appearance of the lumbar spine without acute abnormality identified. Moderate multilevel spondylosis resulting in mild spinal canal and lateral recess stenosis at L3-4 and mild right neural foraminal stenosis at L4-5.   Electronically Signed   By: Logan Bores   On: 05/07/2014 19:04    MEDICATIONS  Scheduled: . carvedilol  3.125  mg Oral BID  . heparin  5,000 Units Subcutaneous 3 times per day  . lisinopril  20 mg Oral Daily    ASSESSMENT/PLAN:                                                                                                            80 year old man with known peripheral neuropathy and weakness distally in both lower extremities admitted to [facility] due to worsening bilateral LE weakness that according to patient developed rather abruptly yesterday morning. MRI thoracic and lumbar spine unrevealing. Has findings on exam consistent with a neuropathic process, including bilateral old foot drop. Agree with MRI brain to exclude a central cause of rapid neurological deterioration, although he certainly has no cortical, cerebellar, or brainstem signs on exam. Will follow up.   Dorian Pod, MD [facility] [phone]  05/08/2014, 9:46 AM

## 2014-05-08 NOTE — Care Management Note (Unsigned)
    Page 1 of 1   05/08/2014     4:48:35 PM CARE MANAGEMENT NOTE 05/08/2014  Patient:  Manuel [name], Manuel [name]   Account Number:  [id number]  Date Initiated:  05/08/2014  Documentation initiated by:  Neita Landrigan  Subjective/Objective Assessment:   Pt adm on 05/07/14 with acute onset bilat leg weakness. PTA, pt resides at home with spouse.     Action/Plan:   PT/OT recommending SNF at dc; CSW following to facilitate dc to possible SNF when medically stable for dc.   Anticipated DC Date:  05/09/2014   Anticipated DC Plan:  SKILLED NURSING FACILITY  In-house referral  Clinical Social Worker      DC Planning Services  CM consult      Choice offered to / List presented to:             Status of service:  In process, will continue to follow Medicare Important Message given?   (If response is "NO", the following Medicare IM given date fields will be blank) Date Medicare IM given:   Medicare IM given by:   Date Additional Medicare IM given:   Additional Medicare IM given by:    Discharge Disposition:    Per UR Regulation:  Reviewed for med. necessity/level of care/duration of stay  If discussed at [place] of Stay Meetings, dates discussed:    Comments:

## 2014-05-08 NOTE — Progress Notes (Signed)
CSW provided bed offers to patient and patient's wife at discharge. Patient's wife would either like [facility] or [facility] at dc. Patient's wife states she will follow up with social worker this afternoon once they speak with their daughter.   Jeanette Caprice, MSW, [place]

## 2014-05-08 NOTE — Progress Notes (Signed)
Clinical Social Work Department BRIEF PSYCHOSOCIAL ASSESSMENT 05/08/2014  Patient:  Manuel [name], Manuel [name]     Account Number:  [id number]     Admit date:  05/07/2014  Clinical Social Worker:  Megan Salon  Date/Time:  05/08/2014 11:37 AM  Referred by:  Physician  Date Referred:  05/08/2014 Referred for  SNF Placement   Other Referral:   Interview type:  Patient Other interview type:    PSYCHOSOCIAL DATA Living Status:  WIFE Admitted from facility:   Level of care:   Primary support name:  Lyndee Hensen Primary support relationship to patient:  SPOUSE Degree of support available:   Good    CURRENT CONCERNS Current Concerns  Post-Acute Placement   Other Concerns:    SOCIAL WORK ASSESSMENT / PLAN Clinical Social Worker received referral for SNF placement at d/c. Clinical Social Worker met with patient at bedside to offer support and discuss patient needs at discharge.  CSW introduced self and explained reason for visit.  CSW explained SNF process to patient Patient reported he is agreeable for SNF process to be started and faxed out to [facility] but would like to speak to his wife about SNF. CSW will complete FL2 for MD's signature and will update patient and family when bed offers are received.    CSW remains available for support and to facilitate patient discharge needs once medically ready.   Assessment/plan status:  Psychosocial Support/Ongoing Assessment of Needs Other assessment/ plan:   Information/referral to community resources:   snf information    PATIENT'S/FAMILY'S RESPONSE TO PLAN OF CARE: Patient states that he has to talk to his wife about SNF because patient states they are raising an 80 year old at home.       Jeanette Caprice, MSW, [place]

## 2014-05-08 NOTE — Progress Notes (Signed)
05/08/2014 2:49 PM Nursing note Patient currently refusing 325 mg aspirin as ordered. Stating his primary care physician stated to not take aspirin again due to risk of GI bleeding. Patient education provided regarding importance of aspirin. MD made aware of refusal. MD to see patient.  Will continue to monitor patient. Lupita Dawn

## 2014-05-08 NOTE — Progress Notes (Signed)
Received prescreen request for inpatient rehab and have reviewed pt's case. I have made note of PT's recommendation for possible CIR "if pt agreeable" and OT's recommendation for SNF.   Pt has [facility] and based on pt's current diagnosis of new bilateral LE weakness, it is unlikely that his insurance would give authorization for acute inpatient rehab.   I have made note of neuro's plan to have MRI brain completed as pt "has findings on exam consistent with a neuropathic process, including bilateral old foot drop. Agree with MRI brain to exclude a central cause of rapid neurological deterioration, although he certainly has no cortical, cerebellar, or brainstem signs on exam."  If these MRI findings show a significant neurological process, then we could consider possible inpatient rehab consult. Please contact me if this becomes a possibility.  At this time, we recommend either skilled nursing facility or follow up home health support.   Please call me with any questions. Thanks.  Nanetta Batty, PT Rehabilitation Admissions Coordinator [phone]

## 2014-05-08 NOTE — Evaluation (Signed)
Physical Therapy Evaluation Patient Details Name: Manuel [name] MRN: [id number] DOB: 1922-03-06 Today's Date: 05/08/2014   History of Present Illness  Manuel [name] is a 80 y.o. male with Past medical history of hypertension, irregular heartbeat, coronary artery disease, peripheral neuropathy.  Presents with new onset BLE weakness and inability to get OOB, causing him to come to ED.   Clinical Impression  Pt presents with marked strength and balance deficits during PT evaluation.  Note he feels as though he is close to baseline, however per pt at baseline, he was independent with use of UEs ("furniture walking").  Pt requires max A to stand from recliner and max A for gait without AD, mod A gait with use of RW with steppage gait (pre morbid) and scissored/staggered gait pattern, causing pt to be at extremely high risk for fall.  Had lengthy discussion with pt and granddaughter regarding safety concerns from PT and options for D/C, as pt does not have 24/7 assist at home and wife is not able to physically assist.  Feel that he would benefit from continued therapy at [facility] (IF pt agrees and will follow therapy recommendations, as pt VERY hesitant and resistant to therapy recommendations during our session) or SNF.  Granddaughter in agreement and very concerned about pt falling at home. She is to call and discuss with family.  PT spoke with MD to inform of major safety concerns.      Follow Up Recommendations CIR;SNF    Equipment Recommendations  Rolling walker with 5" wheels    Recommendations for Other Services Rehab consult     Precautions / Restrictions Precautions Precautions: Fall Precaution Comments: Pt with long standing foot drop, has AFO's and doesn't wear,  Restrictions Weight Bearing Restrictions: No      Mobility  Bed Mobility               General bed mobility comments: Pt in recliner when PT arrived.   Transfers Overall transfer level: Needs  assistance Equipment used: None Transfers: Sit to/from Stand Sit to Stand: Max assist         General transfer comment: Pt able to initiate stand well, however unable to fully active glutes/quads for upright stance without max A.  Also note that pt has increased reliance on UE use, therefore had difficulty letting go of arm rests on chair.   Ambulation/Gait Ambulation/Gait assistance: Mod assist;Max assist Ambulation Distance (Feet): 65 Feet Assistive device: Rolling walker (2 wheeled) Gait Pattern/deviations: Steppage;Step-through pattern;Decreased stride length;Decreased dorsiflexion - right;Decreased dorsiflexion - left;Trunk flexed;Staggering right     General Gait Details: Assessed very short distance gait without use of RW, however pt requires mod A and note increased reliance of UEs on furniture around him.  Even following giving pt RW to further assess, note steppage gait (premorbid) and intermittent scissoring/staggering placing pt at very high fall risk.   Stairs            Wheelchair Mobility    Modified Rankin (Stroke Patients Only)       Balance Overall balance assessment: Needs assistance Sitting-balance support: Feet supported Sitting balance-Leahy Scale: Good (static sitting)   Postural control: Posterior lean (posterior LOB with dynamic sitting) Standing balance support: During functional activity Standing balance-Leahy Scale: Zero Standing balance comment: Pt requires max A to achieve upright standing and requires mod/max A at times for gait, esp without RW due to decreased balance.  Pertinent Vitals/Pain No pain stated during session, hx of peripheral neuropathy.     Home Living Family/patient expects to be discharged to:: Private residence Living Arrangements: Spouse/significant other (and a great grandchild) Available Help at Discharge: Available 24 hours/day (wife available 24/7, not able to assist) Type  of Home: House       Home Layout: One level Home Equipment: Walker - standard;Cane - single point;Electric scooter      Prior Function Level of Independence: Independent with assistive device(s)         Comments: Per pt, he was able to perform all ADL's on his own and ambulate inside the house with "furniture walking."  Ambulated outside of house with broomstick or electric scooter, esp lately due to increased weakness.      Hand Dominance        Extremity/Trunk Assessment               Lower Extremity Assessment: Generalized weakness (grossly 2+/5, except 0/5 DF  BLE)      Cervical / Trunk Assessment: Kyphotic  Communication   Communication: No difficulties  Cognition Arousal/Alertness: Awake/alert Behavior During Therapy: WFL for tasks assessed/performed Overall Cognitive Status: Within Functional Limits for tasks assessed (max decreased safety awareness. )                      General Comments      Exercises        Assessment/Plan    PT Assessment Patient needs continued PT services  PT Diagnosis Difficulty walking;Abnormality of gait;Generalized weakness   PT Problem List Decreased strength;Decreased activity tolerance;Decreased balance;Decreased mobility;Decreased coordination;Decreased cognition;Decreased knowledge of use of DME;Decreased safety awareness;Decreased knowledge of precautions;Impaired sensation  PT Treatment Interventions DME instruction;Gait training;Functional mobility training;Therapeutic activities;Therapeutic exercise;Balance training;Patient/family education   PT Goals (Current goals can be found in the Care Plan section) Acute Rehab PT Goals Patient Stated Goal: to return home ASAP PT Goal Formulation: With patient/family Time For Goal Achievement: 05/15/14 Potential to Achieve Goals: Good    Frequency Min 3X/week   Barriers to discharge Decreased caregiver support      Co-evaluation               End of  Session Equipment Utilized During Treatment: Gait belt Activity Tolerance: Patient tolerated treatment well Patient left: in chair;with call bell/phone within reach;with family/visitor present Nurse Communication: Mobility status;Other (comment) (spoke with MD regarding D/C planning)    Functional Assessment Tool Used: Clinical judgement Functional Limitation: Mobility: Walking and moving around Mobility: Walking and Moving Around Current Status [phone]): At least 60 percent but less than 80 percent impaired, limited or restricted Mobility: Walking and Moving Around Goal Status [phone]): At least 40 percent but less than 60 percent impaired, limited or restricted    Time: [phone] PT Time Calculation (min): 37 min   Charges:   PT Evaluation $Initial PT Evaluation Tier I: 1 Procedure PT Treatments $Gait Training: 8-22 mins $Self Care/Home Management: 8-22   PT G Codes:   Functional Assessment Tool Used: Clinical judgement Functional Limitation: Mobility: Walking and moving around    [place] 05/08/2014, 10:28 AM

## 2014-05-08 NOTE — Plan of Care (Signed)
Problem: Phase I Progression Outcomes Goal: OOB as tolerated unless otherwise ordered Outcome: Progressing Able to get OOB and go to bathroom with Stedy and one person assist.

## 2014-05-08 NOTE — Progress Notes (Signed)
[facility] PROGRESS NOTE  Manuel [name] EUM:[id number] DOB: 1922/01/12 DOA: 05/07/2014 PCP: Shirline Frees, MD  Assessment/Plan: 1. Acute bilateral leag weakness, resolved -has long standing h/o Peripheral NEuropathy and gait disorder -MRI T spine normal -check MRI brain r/o post circulation CVA/TIA -NEuro following -Pt/OT eval  2. HTN -stable, continue ACE  3. H/o Chronic constipation -continue colace  4. CAD -stable, continue coreg  DVT proph: hep SQ  Code Status: Full Code Family Communication:d/w grand daughter at bedside Disposition Plan: pending workup, SNF vs Home   Consultants:  NEuro  HPI/Subjective: Back to baseline  Objective: Filed Vitals:   05/08/14 1113  BP: 148/69  Pulse: 60  Temp:   Resp:     Intake/Output Summary (Last 24 hours) at 05/08/14 1351 Last data filed at 05/08/14 0953  Gross per 24 hour  Intake    240 ml  Output      0 ml  Net    240 ml   Filed Weights   05/07/14 2352  Weight: 88.905 kg (196 lb)    Exam:   General:  AAOx3  Cardiovascular: S1S2/RRR  Respiratory: CTAB  Abdomen: soft, Nt, BS present  Musculoskeletal: no edema c/c  NEuro: periph neuropathy, diminished DTR and mute plantars, rest non focal   Data Reviewed: Basic Metabolic Panel:  Recent Labs Lab 05/07/14 1531 05/08/14 0537  NA 141 143  K 4.9 4.5  CL 105 106  CO2 24 22  GLUCOSE 93 96  BUN 22 19  CREATININE 1.05 1.05  CALCIUM 9.2 9.2   Liver Function Tests:  Recent Labs Lab 05/08/14 0537  AST 16  ALT 11  ALKPHOS 52  BILITOT 0.5  PROT 6.8  ALBUMIN 3.7   No results found for this basename: LIPASE, AMYLASE,  in the last 168 hours No results found for this basename: AMMONIA,  in the last 168 hours CBC:  Recent Labs Lab 05/07/14 1531 05/08/14 0537  WBC 6.7 6.6  HGB 13.8 13.4  HCT 41.2 40.7  MCV 87.8 87.7  PLT 187 202   Cardiac Enzymes: No results found for this basename: CKTOTAL, CKMB, CKMBINDEX, TROPONINI,  in  the last 168 hours BNP (last 3 results) No results found for this basename: PROBNP,  in the last 8760 hours CBG: No results found for this basename: GLUCAP,  in the last 168 hours  No results found for this or any previous visit (from the past 240 hour(s)).   Studies: Mr [name] Wo Contrast  05/08/2014   CLINICAL DATA:  Hypertension. A written may have. Bilateral leg weakness. Unable to walk.  EXAM: MRI HEAD WITHOUT CONTRAST  MRA HEAD WITHOUT CONTRAST  TECHNIQUE: Multiplanar, multiecho pulse sequences of the brain and surrounding structures were obtained without intravenous contrast. Angiographic images of the head were obtained using MRA technique without contrast.  COMPARISON:  Head CT 01/15/2009  FINDINGS: MRI HEAD FINDINGS  Diffusion imaging shows several sub cm foci of acute cortical infarction in the left medial parietal lobe towards the vertex. No large vessel territory infarction. No significant swelling. No hemorrhage.  Elsewhere, there are mild chronic small-vessel changes of the pons. No focal cerebellar insult. There are old small vessel ischemic changes affecting the thalami and the cerebral hemispheric white matter. No mass lesion, hydrocephalus or extra-axial collection. There appears to be a pituitary mass on the left side measuring 11 x 12 mm. This was not primarily or completely evaluated. No skull or skullbase lesion.  MRA HEAD FINDINGS  Both internal  carotid arteries are widely patent into the brain. The anterior and middle cerebral vessels are patent but show some diffuse atherosclerotic irregularity. No evidence of correctable stenosis. The right vertebral artery is widely patent to the basilar and is dominant. The nondominant left vertebral artery shows a severe stenosis just proximal to the basilar. No basilar stenosis. Posterior circulation branch vessels are patent but show atherosclerotic irregularity.  IMPRESSION: Clustered areas of small cortical infarctions in the medial left  parietal lobe at the vertex consistent with small infarctions in a left MCA branch vessel territory. No hemorrhage or swelling.  Chronic small vessel ischemic changes elsewhere throughout the brain.  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 This was not primarily or completely evaluated.  No major vessel occlusion. Diffuse intracranial irregularity of the medium size vessels consistent with diffuse atherosclerosis. Severe stenosis of the distal left vertebral artery just proximal to the basilar.   Electronically Signed   By: Nelson Chimes M.D.   On: 05/08/2014 11:25   [name]  05/08/2014   CLINICAL DATA:  Hypertension. A written may have. Bilateral leg weakness. Unable to walk.  EXAM: MRI HEAD WITHOUT CONTRAST  MRA HEAD WITHOUT CONTRAST  TECHNIQUE: Multiplanar, multiecho pulse sequences of the brain and surrounding structures were obtained without intravenous contrast. Angiographic images of the head were obtained using MRA technique without contrast.  COMPARISON:  Head CT 01/15/2009  FINDINGS: MRI HEAD FINDINGS  Diffusion imaging shows several sub cm foci of acute cortical infarction in the left medial parietal lobe towards the vertex. No large vessel territory infarction. No significant swelling. No hemorrhage.  Elsewhere, there are mild chronic small-vessel changes of the pons. No focal cerebellar insult. There are old small vessel ischemic changes affecting the thalami and the cerebral hemispheric white matter. No mass lesion, hydrocephalus or extra-axial collection. There appears to be a pituitary mass on the left side measuring 11 x 12 mm. This was not primarily or completely evaluated. No skull or skullbase lesion.  MRA HEAD FINDINGS  Both internal carotid arteries are widely patent into the brain. The anterior and middle cerebral vessels are patent but show some diffuse atherosclerotic irregularity. No evidence of correctable stenosis. The right vertebral artery is  widely patent to the basilar and is dominant. The nondominant left vertebral artery shows a severe stenosis just proximal to the basilar. No basilar stenosis. Posterior circulation branch vessels are patent but show atherosclerotic irregularity.  IMPRESSION: Clustered areas of small cortical infarctions in the medial left parietal lobe at the vertex consistent with small infarctions in a left MCA branch vessel territory. No hemorrhage or swelling.  Chronic small vessel ischemic changes elsewhere throughout the brain.  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 This was not primarily or completely evaluated.  No major vessel occlusion. Diffuse intracranial irregularity of the medium size vessels consistent with diffuse atherosclerosis. Severe stenosis of the distal left vertebral artery just proximal to the basilar.   Electronically Signed   By: Nelson Chimes M.D.   On: 05/08/2014 11:25   Mr Thoracic Spine Wo Contrast  05/07/2014   CLINICAL DATA:  Lower extremity weakness.  EXAM: MRI THORACIC SPINE WITHOUT CONTRAST  TECHNIQUE: Multiplanar, multisequence MR imaging of the thoracic spine was performed. No intravenous contrast was administered.  COMPARISON:  Prior MRI of the lumbar spine performed earlier on the same day.  FINDINGS: The vertebral bodies are normally aligned with preservation of  the normal thoracic kyphosis. Vertebral body heights are well preserved. No fracture or listhesis.  Mild multilevel degenerative intervertebral disc space narrowing seen throughout the thoracic spine with associated chronic endplate Schmorl's nodes. No significant degenerative disc bulge identified. No focal disc protrusions. Mild multilevel facet arthropathy noted. No central canal stenosis or cord compression. No significant foraminal narrowing identified.  The visualized paraspinous soft tissues are within normal limits. Visualized lungs are clear.  IMPRESSION: No acute abnormality identified within  the thoracic spine. No evidence of cord compression or other intrinsic spinal cord abnormality.   Electronically Signed   By: Jeannine Boga M.D.   On: 05/07/2014 23:38   Mr Lumbar Spine Wo Contrast  05/07/2014   CLINICAL DATA:  Sudden onset bilateral lower extremity weakness.  EXAM: MRI LUMBAR SPINE WITHOUT CONTRAST  TECHNIQUE: Multiplanar, multisequence MR imaging of the lumbar spine was performed. No intravenous contrast was administered.  COMPARISON:  03/10/2012  FINDINGS: Vertebral alignment is normal. Vertebral body heights are preserved without compression fracture. Moderate disc space narrowing and associated degenerative marrow changes are again seen at L4-5. Right-sided pars defect is again noted at L5. 10 mm T1 and T2 hypointense lesion in the L3 vertebral body is unchanged, sclerotic on prior CT. The conus medullaris is normal in signal and terminates at L1. Small T2 hyperintense lesion arising from the upper pole of the left kidney is unchanged, most likely a cyst.  T12-L1 and L1-2:  Negative.  L2-3: Mild disc bulge and facet and ligamentum flavum hypertrophy without spinal canal or neural foraminal stenosis, unchanged.  L3-4: Mild disc bulge and facet and ligamentum flavum hypertrophy result in mild spinal stenosis, mild left greater the right lateral recess stenosis, and mild left neural foraminal narrowing, unchanged.  L4-5: Prior laminectomies with decompressed spinal canal. Disc bulging and spurring asymmetric to the right and mild facet and ligamentum flavum hypertrophy result in mild right neural foraminal stenosis, unchanged.  L5-S1: Mild facet hypertrophy result in mild left greater than right neural foraminal narrowing without spinal stenosis, unchanged.  IMPRESSION: Unchanged appearance of the lumbar spine without acute abnormality identified. Moderate multilevel spondylosis resulting in mild spinal canal and lateral recess stenosis at L3-4 and mild right neural foraminal stenosis at  L4-5.   Electronically Signed   By: Logan Bores   On: 05/07/2014 19:04    Scheduled Meds: . aspirin  325 mg Oral Daily  . carvedilol  3.125 mg Oral BID  . heparin  5,000 Units Subcutaneous 3 times per day  . lisinopril  20 mg Oral Daily   Continuous Infusions:  Antibiotics Given (last 72 hours)   None      Principal Problem:   Bilateral leg weakness Active Problems:   PERIPHERAL NEUROPATHY   HYPERTENSION   CAD   Peripheral vascular disease, unspecified   Lower extremity weakness    Time spent: 47min    Bellanie Matthew  [facility] Pager [phone]. If 7PM-7AM, please contact night-coverage at [email or web address], password [facility] 05/08/2014, 1:51 PM  LOS: 1 day

## 2014-05-08 NOTE — Evaluation (Signed)
Occupational Therapy Evaluation Patient Details Name: Manuel [name] MRN: [id number] DOB: Mar 01, 1922 Today's Date: 05/08/2014    History of Present Illness 80 yo male with bil LE weakness progressivel Past medical history of hypertension, irregular heartbeat, coronary artery disease, peripheral neuropathy.y worsening over week.    Clinical Impression   PT admitted with BIL LE weakness. Pt currently with functional limitiations due to the deficits listed below (see OT problem list). Pt from home PTA and very high risk for falls. Pt will benefit from skilled OT to increase their independence and safety with adls and balance to allow discharge SNF. Pt unable to sustain static standing EOB and unable to static sit EOB for LB dressing. OT to follow acutely due to balance deficits with all adls and high risk for falls. Pt plans to have 8 yr great grandchild help at home and wife as she is able. Pt reports "i am like this because I didn't go to the [facility] all last week"     Follow Up Recommendations  SNF    Equipment Recommendations  Other (comment) (defer SNF)    Recommendations for Other Services       Precautions / Restrictions Precautions Precautions: Fall Precaution Comments: BIL AFO due to foot drop Restrictions Weight Bearing Restrictions: No      Mobility Bed Mobility Overal bed mobility: Needs Assistance Bed Mobility: Supine to Sit;Rolling;Sidelying to Sit Rolling: Max assist Sidelying to sit: Max assist Supine to sit: Max assist     General bed mobility comments: pt was unable to problem solve task without tactile and verbal response.  Transfers Overall transfer level: Needs assistance Equipment used:  (stedy) Transfers: Sit to/from Stand Sit to Stand: Max assist         General transfer comment: Pt unable to sustain static standing    Balance Overall balance assessment: Needs assistance Sitting-balance support: Bilateral upper extremity supported;Feet  supported Sitting balance-Leahy Scale: Poor   Postural control: Posterior lean Standing balance support: Bilateral upper extremity supported;During functional activity Standing balance-Leahy Scale: Zero Standing balance comment: Pt requires max A to achieve upright standing and requires mod/max A at times for gait, esp without RW due to decreased balance.                             ADL Overall ADL's : Needs assistance/impaired Eating/Feeding: Set up;Sitting Eating/Feeding Details (indicate cue type and reason): pt with breakfast present and unaware Grooming: Wash/dry face;Set up;Bed level Grooming Details (indicate cue type and reason): used wash cloth to help arouse patient from sleeping             Lower Body Dressing: Maximal assistance (sitting EOB) Lower Body Dressing Details (indicate cue type and reason): pt with immediate LOB posteriorly. Pt required MAx (A) to maintain static sitting EOB to reach for bil LE.  Toilet Transfer: Maximal assistance (stedy used)             General ADL Comments: Pt attempted exiting the bed and was unable to do so. Pt required max (A) for bed mobility. pt with immediate lob posteriorly at EOB. Pt required (A) for LB dressing. pt attempting sit<>Stand x2 with immediate LOB with static standing. pt reports "feels like bone on bone in there" pt could not maintain a static eob standing sustain position. Pt required stedy to transfer to chair safely.      Vision  Perception     Praxis      Pertinent Vitals/Pain VSS     Hand Dominance Right   Extremity/Trunk Assessment Upper Extremity Assessment Upper Extremity Assessment: Overall WFL for tasks assessed   Lower Extremity Assessment Lower Extremity Assessment: Defer to PT evaluation   Cervical / Trunk Assessment Cervical / Trunk Assessment: Kyphotic   Communication Communication Communication: No difficulties   Cognition Arousal/Alertness:  Awake/alert Behavior During Therapy: WFL for tasks assessed/performed Overall Cognitive Status: Within Functional Limits for tasks assessed                     General Comments       Exercises       Shoulder Instructions      Home Living Family/patient expects to be discharged to:: Private residence Living Arrangements: Spouse/significant other Available Help at Discharge: Available 24 hours/day Type of Home: House Home Access: Stairs to enter [employer] of Steps: 4   Home Layout: One level     Bathroom Shower/Tub: Occupational psychologist: Standard     Home Equipment: Walker - standard;Cane - single point;Electric scooter (broom stick uses all the time)   Additional Comments: 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 Pt reports "she takes care of [place]"      Prior Functioning/Environment Level of Independence: Independent with assistive device(s)        Comments: per pt report he uses no DME in the house and broom stick handle to walk to the mailbox.     OT Diagnosis: Generalized weakness   OT Problem List: Decreased strength;Decreased activity tolerance;Impaired balance (sitting and/or standing);Decreased safety awareness;Decreased knowledge of use of DME or AE;Decreased knowledge of precautions;Pain;Obesity   OT Treatment/Interventions: Self-care/ADL training;Therapeutic exercise;DME and/or AE instruction;Therapeutic activities;Patient/family education;Balance training    OT Goals(Current goals can be found in the care plan section) Acute Rehab OT Goals Patient Stated Goal: to return home ASAP OT Goal Formulation: With patient Time For Goal Achievement: 05/22/14 Potential to Achieve Goals: Good  OT Frequency: Min 2X/week   Barriers to D/C:            Co-evaluation              End of Session Equipment Utilized During Treatment: Gait belt Nurse Communication: Mobility status;Precautions  Activity  Tolerance: Patient tolerated treatment well Patient left: in chair;with call bell/phone within reach;with family/visitor present   Time: 3664-4034 OT Time Calculation (min): 18 min Charges:  OT General Charges $OT Visit: 1 Procedure OT Evaluation $Initial OT Evaluation Tier I: 1 Procedure OT Treatments $Self Care/Home Management : 8-22 mins G-Codes: OT G-codes **NOT FOR INPATIENT CLASS** Functional Assessment Tool Used: clinical judgement Functional Limitation: Self care Self Care Current Status ([id number]): At least 80 percent but less than 100 percent impaired, limited or restricted Self Care Goal Status ([id number]): At least 80 percent but less than 100 percent impaired, limited or restricted  Peri Maris 05/08/2014, 11:51 AM Pager: [phone]

## 2014-05-08 NOTE — Progress Notes (Signed)
Clinical Social Work Department CLINICAL SOCIAL WORK PLACEMENT NOTE 05/08/2014  Patient:  SALEM, LEMBKE  Account Number:  [id number] Admit date:  05/07/2014  Clinical Social Worker:  Megan Salon  Date/time:  05/08/2014 11:43 AM  Clinical Social Work is seeking post-discharge placement for this patient at the following level of care:   [place]   (*CSW will update this form in Epic as items are completed)   05/08/2014  Patient/family provided with [place] Department of Clinical Social Work's list of facilities offering this level of care within the geographic area requested by the patient (or if unable, by the patient's family).  05/08/2014  Patient/family informed of their freedom to choose among providers that offer the needed level of care, that participate in Medicare, Medicaid or managed care program needed by the patient, have an available bed and are willing to accept the patient.  05/08/2014  Patient/family informed of [facility]' ownership interest in [facility], as well as of the fact that they are under no obligation to receive care at this facility.  PASARR submitted to EDS on 05/08/2014 PASARR number received on 05/08/2014  FL2 transmitted to all facilities in geographic area requested by pt/family on  05/08/2014 FL2 transmitted to all facilities within larger geographic area on   Patient informed that his/her managed care company has contracts with or will negotiate with  certain facilities, including the following:     Patient/family informed of bed offers received:   Patient chooses bed at  Physician recommends and patient chooses bed at    Patient to be transferred to  on   Patient to be transferred to facility by  Patient and family notified of transfer on  Name of family member notified:    The following physician request were entered in Epic:   Additional Comments:  Jeanette Caprice, MSW, [place]

## 2014-05-08 NOTE — Progress Notes (Signed)
Utilization review completed.  

## 2014-05-09 DIAGNOSIS — Z9089 Acquired absence of other organs: Secondary | ICD-10-CM | POA: Diagnosis not present

## 2014-05-09 DIAGNOSIS — I6340 Cerebral infarction due to embolism of unspecified cerebral artery: Secondary | ICD-10-CM | POA: Diagnosis present

## 2014-05-09 DIAGNOSIS — M216X9 Other acquired deformities of unspecified foot: Secondary | ICD-10-CM | POA: Diagnosis present

## 2014-05-09 DIAGNOSIS — Z880 Allergy status to penicillin: Secondary | ICD-10-CM | POA: Diagnosis not present

## 2014-05-09 DIAGNOSIS — Z8249 Family history of ischemic heart disease and other diseases of the circulatory system: Secondary | ICD-10-CM | POA: Diagnosis not present

## 2014-05-09 DIAGNOSIS — I4891 Unspecified atrial fibrillation: Secondary | ICD-10-CM | POA: Diagnosis present

## 2014-05-09 DIAGNOSIS — I658 Occlusion and stenosis of other precerebral arteries: Secondary | ICD-10-CM | POA: Diagnosis present

## 2014-05-09 DIAGNOSIS — I6350 Cerebral infarction due to unspecified occlusion or stenosis of unspecified cerebral artery: Secondary | ICD-10-CM

## 2014-05-09 DIAGNOSIS — Z7902 Long term (current) use of antithrombotics/antiplatelets: Secondary | ICD-10-CM | POA: Diagnosis not present

## 2014-05-09 DIAGNOSIS — Z882 Allergy status to sulfonamides status: Secondary | ICD-10-CM | POA: Diagnosis not present

## 2014-05-09 DIAGNOSIS — I359 Nonrheumatic aortic valve disorder, unspecified: Secondary | ICD-10-CM

## 2014-05-09 DIAGNOSIS — I639 Cerebral infarction, unspecified: Secondary | ICD-10-CM | POA: Diagnosis present

## 2014-05-09 DIAGNOSIS — Z79899 Other long term (current) drug therapy: Secondary | ICD-10-CM | POA: Diagnosis not present

## 2014-05-09 DIAGNOSIS — Z91041 Radiographic dye allergy status: Secondary | ICD-10-CM | POA: Diagnosis not present

## 2014-05-09 DIAGNOSIS — Z823 Family history of stroke: Secondary | ICD-10-CM | POA: Diagnosis not present

## 2014-05-09 DIAGNOSIS — D352 Benign neoplasm of pituitary gland: Secondary | ICD-10-CM | POA: Diagnosis present

## 2014-05-09 DIAGNOSIS — K5900 Constipation, unspecified: Secondary | ICD-10-CM | POA: Diagnosis present

## 2014-05-09 DIAGNOSIS — G608 Other hereditary and idiopathic neuropathies: Secondary | ICD-10-CM | POA: Diagnosis present

## 2014-05-09 DIAGNOSIS — R29898 Other symptoms and signs involving the musculoskeletal system: Secondary | ICD-10-CM | POA: Diagnosis present

## 2014-05-09 DIAGNOSIS — I739 Peripheral vascular disease, unspecified: Secondary | ICD-10-CM | POA: Diagnosis present

## 2014-05-09 DIAGNOSIS — Z833 Family history of diabetes mellitus: Secondary | ICD-10-CM | POA: Diagnosis not present

## 2014-05-09 DIAGNOSIS — Z888 Allergy status to other drugs, medicaments and biological substances status: Secondary | ICD-10-CM | POA: Diagnosis not present

## 2014-05-09 DIAGNOSIS — H9190 Unspecified hearing loss, unspecified ear: Secondary | ICD-10-CM | POA: Diagnosis present

## 2014-05-09 DIAGNOSIS — I6529 Occlusion and stenosis of unspecified carotid artery: Secondary | ICD-10-CM | POA: Diagnosis present

## 2014-05-09 DIAGNOSIS — I10 Essential (primary) hypertension: Secondary | ICD-10-CM | POA: Diagnosis present

## 2014-05-09 DIAGNOSIS — I2510 Atherosclerotic heart disease of native coronary artery without angina pectoris: Secondary | ICD-10-CM | POA: Diagnosis present

## 2014-05-09 DIAGNOSIS — Z86010 Personal history of colonic polyps: Secondary | ICD-10-CM | POA: Diagnosis not present

## 2014-05-09 LAB — LIPID PANEL
Cholesterol: 139 mg/dL (ref 0–200)
HDL: 28 mg/dL — ABNORMAL LOW (ref >39)
LDL CALC: 85 mg/dL (ref 0–99)
TRIGLYCERIDES: 131 mg/dL (ref <150)
Total CHOL/HDL Ratio: 5 RATIO
VLDL: 26 mg/dL (ref 0–40)

## 2014-05-09 LAB — HEMOGLOBIN A1C
Hgb A1c MFr Bld: 5.9 % — ABNORMAL HIGH (ref <5.7)
Mean Plasma Glucose: 123 mg/dL — ABNORMAL HIGH (ref <117)

## 2014-05-09 MED ORDER — CLOPIDOGREL BISULFATE 75 MG PO TABS
75.0000 mg | ORAL_TABLET | Freq: Every day | ORAL | Status: DC
  Administered 2014-05-09 – 2014-05-12 (×4): 75 mg via ORAL
  Filled 2014-05-09 (×5): qty 1

## 2014-05-09 NOTE — Progress Notes (Signed)
*  PRELIMINARY RESULTS* Vascular Ultrasound Carotid Duplex (Doppler) has been completed.  Preliminary findings: Right:  40-59%, upper end of scale, internal carotid artery stenosis. Left:  40-59% internal carotid artery stenosis.  Bilateral:  Vertebral artery flow is antegrade.        Manuel [name] 05/09/2014, 6:35 PM

## 2014-05-09 NOTE — Progress Notes (Signed)
Utilization review completed.  

## 2014-05-09 NOTE — Progress Notes (Signed)
[facility] PROGRESS NOTE  Manuel [name] QBV:[id number] DOB: [birth year] DOA: 05/07/2014 PCP: Shirline Frees, MD  Assessment/Plan: 1. Acute CVA-L pareital lobe -has long standing h/o Peripheral Neuropathy and gait disorder -MRI T spine normal -ECHO and carotid duplex still pending -LDL: 85, hbaic 5.9 -NEuro following -Pt/OT eval completed, SNF recommended -reluctant to take ASA due to h/o GI bleed in 2010, started on Plavix per Neuro  2. HTN -stable, continue ACE  3. H/o Chronic constipation -continue colace  4. CAD -stable, continue coreg  DVT proph: hep SQ  Code Status: Full Code Family Communication:d/w grand daughter at bedside Disposition Plan: pending workup, SNF vs Home   Consultants:  NEuro  HPI/Subjective: Back to baseline  Objective: Filed Vitals:   05/09/14 0925  BP: 132/72  Pulse: 68  Temp:   Resp:     Intake/Output Summary (Last 24 hours) at 05/09/14 1121 Last data filed at 05/08/14 2200  Gross per 24 hour  Intake    720 ml  Output      0 ml  Net    720 ml   Filed Weights   05/07/14 2352 05/09/14 0333  Weight: 88.905 kg (196 lb) 87.5 kg (192 lb 14.4 oz)    Exam:   General:  AAOx3  Cardiovascular: S1S2/RRR  Respiratory: CTAB  Abdomen: soft, Nt, BS present  Musculoskeletal: no edema c/c  NEuro: periph neuropathy, diminished DTR and mute plantars, rest non focal   Data Reviewed: Basic Metabolic Panel:  Recent Labs Lab 05/07/14 1531 05/08/14 0537  NA 141 143  K 4.9 4.5  CL 105 106  CO2 24 22  GLUCOSE 93 96  BUN 22 19  CREATININE 1.05 1.05  CALCIUM 9.2 9.2   Liver Function Tests:  Recent Labs Lab 05/08/14 0537  AST 16  ALT 11  ALKPHOS 52  BILITOT 0.5  PROT 6.8  ALBUMIN 3.7   No results found for this basename: LIPASE, AMYLASE,  in the last 168 hours No results found for this basename: AMMONIA,  in the last 168 hours CBC:  Recent Labs Lab 05/07/14 1531 05/08/14 0537  WBC 6.7 6.6  HGB 13.8  13.4  HCT 41.2 40.7  MCV 87.8 87.7  PLT 187 202   Cardiac Enzymes: No results found for this basename: CKTOTAL, CKMB, CKMBINDEX, TROPONINI,  in the last 168 hours BNP (last 3 results) No results found for this basename: PROBNP,  in the last 8760 hours CBG: No results found for this basename: GLUCAP,  in the last 168 hours  No results found for this or any previous visit (from the past 240 hour(s)).   Studies: Mr [name] Wo Contrast  05/08/2014   CLINICAL DATA:  Hypertension. A written may have. Bilateral leg weakness. Unable to walk.  EXAM: MRI HEAD WITHOUT CONTRAST  MRA HEAD WITHOUT CONTRAST  TECHNIQUE: Multiplanar, multiecho pulse sequences of the brain and surrounding structures were obtained without intravenous contrast. Angiographic images of the head were obtained using MRA technique without contrast.  COMPARISON:  Head CT 01/15/2009  FINDINGS: MRI HEAD FINDINGS  Diffusion imaging shows several sub cm foci of acute cortical infarction in the left medial parietal lobe towards the vertex. No large vessel territory infarction. No significant swelling. No hemorrhage.  Elsewhere, there are mild chronic small-vessel changes of the pons. No focal cerebellar insult. There are old small vessel ischemic changes affecting the thalami and the cerebral hemispheric white matter. No mass lesion, hydrocephalus or extra-axial collection. There appears to be a pituitary  mass on the left side measuring 11 x 12 mm. This was not primarily or completely evaluated. No skull or skullbase lesion.  MRA HEAD FINDINGS  Both internal carotid arteries are widely patent into the brain. The anterior and middle cerebral vessels are patent but show some diffuse atherosclerotic irregularity. No evidence of correctable stenosis. The right vertebral artery is widely patent to the basilar and is dominant. The nondominant left vertebral artery shows a severe stenosis just proximal to the basilar. No basilar stenosis. Posterior  circulation branch vessels are patent but show atherosclerotic irregularity.  IMPRESSION: Clustered areas of small cortical infarctions in the medial left parietal lobe at the vertex consistent with small infarctions in a left MCA branch vessel territory. No hemorrhage or swelling.  Chronic small vessel ischemic changes elsewhere throughout the brain.  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 This was not primarily or completely evaluated.  No major vessel occlusion. Diffuse intracranial irregularity of the medium size vessels consistent with diffuse atherosclerosis. Severe stenosis of the distal left vertebral artery just proximal to the basilar.   Electronically Signed   By: Nelson Chimes M.D.   On: 05/08/2014 11:25   [name]  05/08/2014   CLINICAL DATA:  Hypertension. A written may have. Bilateral leg weakness. Unable to walk.  EXAM: MRI HEAD WITHOUT CONTRAST  MRA HEAD WITHOUT CONTRAST  TECHNIQUE: Multiplanar, multiecho pulse sequences of the brain and surrounding structures were obtained without intravenous contrast. Angiographic images of the head were obtained using MRA technique without contrast.  COMPARISON:  Head CT 01/15/2009  FINDINGS: MRI HEAD FINDINGS  Diffusion imaging shows several sub cm foci of acute cortical infarction in the left medial parietal lobe towards the vertex. No large vessel territory infarction. No significant swelling. No hemorrhage.  Elsewhere, there are mild chronic small-vessel changes of the pons. No focal cerebellar insult. There are old small vessel ischemic changes affecting the thalami and the cerebral hemispheric white matter. No mass lesion, hydrocephalus or extra-axial collection. There appears to be a pituitary mass on the left side measuring 11 x 12 mm. This was not primarily or completely evaluated. No skull or skullbase lesion.  MRA HEAD FINDINGS  Both internal carotid arteries are widely patent into the brain. The anterior and  middle cerebral vessels are patent but show some diffuse atherosclerotic irregularity. No evidence of correctable stenosis. The right vertebral artery is widely patent to the basilar and is dominant. The nondominant left vertebral artery shows a severe stenosis just proximal to the basilar. No basilar stenosis. Posterior circulation branch vessels are patent but show atherosclerotic irregularity.  IMPRESSION: Clustered areas of small cortical infarctions in the medial left parietal lobe at the vertex consistent with small infarctions in a left MCA branch vessel territory. No hemorrhage or swelling.  Chronic small vessel ischemic changes elsewhere throughout the brain.  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 This was not primarily or completely evaluated.  No major vessel occlusion. Diffuse intracranial irregularity of the medium size vessels consistent with diffuse atherosclerosis. Severe stenosis of the distal left vertebral artery just proximal to the basilar.   Electronically Signed   By: Nelson Chimes M.D.   On: 05/08/2014 11:25   Mr Thoracic Spine Wo Contrast  05/07/2014   CLINICAL DATA:  Lower extremity weakness.  EXAM: MRI THORACIC SPINE WITHOUT CONTRAST  TECHNIQUE: Multiplanar, multisequence MR imaging of the thoracic spine was performed. No intravenous  contrast was administered.  COMPARISON:  Prior MRI of the lumbar spine performed earlier on the same day.  FINDINGS: The vertebral bodies are normally aligned with preservation of the normal thoracic kyphosis. Vertebral body heights are well preserved. No fracture or listhesis.  Mild multilevel degenerative intervertebral disc space narrowing seen throughout the thoracic spine with associated chronic endplate Schmorl's nodes. No significant degenerative disc bulge identified. No focal disc protrusions. Mild multilevel facet arthropathy noted. No central canal stenosis or cord compression. No significant foraminal narrowing  identified.  The visualized paraspinous soft tissues are within normal limits. Visualized lungs are clear.  IMPRESSION: No acute abnormality identified within the thoracic spine. No evidence of cord compression or other intrinsic spinal cord abnormality.   Electronically Signed   By: Jeannine Boga M.D.   On: 05/07/2014 23:38   Mr Lumbar Spine Wo Contrast  05/07/2014   CLINICAL DATA:  Sudden onset bilateral lower extremity weakness.  EXAM: MRI LUMBAR SPINE WITHOUT CONTRAST  TECHNIQUE: Multiplanar, multisequence MR imaging of the lumbar spine was performed. No intravenous contrast was administered.  COMPARISON:  03/10/2012  FINDINGS: Vertebral alignment is normal. Vertebral body heights are preserved without compression fracture. Moderate disc space narrowing and associated degenerative marrow changes are again seen at L4-5. Right-sided pars defect is again noted at L5. 10 mm T1 and T2 hypointense lesion in the L3 vertebral body is unchanged, sclerotic on prior CT. The conus medullaris is normal in signal and terminates at L1. Small T2 hyperintense lesion arising from the upper pole of the left kidney is unchanged, most likely a cyst.  T12-L1 and L1-2:  Negative.  L2-3: Mild disc bulge and facet and ligamentum flavum hypertrophy without spinal canal or neural foraminal stenosis, unchanged.  L3-4: Mild disc bulge and facet and ligamentum flavum hypertrophy result in mild spinal stenosis, mild left greater the right lateral recess stenosis, and mild left neural foraminal narrowing, unchanged.  L4-5: Prior laminectomies with decompressed spinal canal. Disc bulging and spurring asymmetric to the right and mild facet and ligamentum flavum hypertrophy result in mild right neural foraminal stenosis, unchanged.  L5-S1: Mild facet hypertrophy result in mild left greater than right neural foraminal narrowing without spinal stenosis, unchanged.  IMPRESSION: Unchanged appearance of the lumbar spine without acute  abnormality identified. Moderate multilevel spondylosis resulting in mild spinal canal and lateral recess stenosis at L3-4 and mild right neural foraminal stenosis at L4-5.   Electronically Signed   By: Logan Bores   On: 05/07/2014 19:04    Scheduled Meds: . carvedilol  3.125 mg Oral BID  . clopidogrel  75 mg Oral Daily  . heparin  5,000 Units Subcutaneous 3 times per day  . lisinopril  20 mg Oral Daily   Continuous Infusions:  Antibiotics Given (last 72 hours)   None      Principal Problem:   Bilateral leg weakness Active Problems:   PERIPHERAL NEUROPATHY   HYPERTENSION   CAD   Peripheral vascular disease, unspecified   Lower extremity weakness    Time spent: 68min    Jhonnie Aliano  [facility] Pager [phone]. If 7PM-7AM, please contact night-coverage at [email or web address], password [facility] 05/09/2014, 11:21 AM  LOS: 2 days

## 2014-05-09 NOTE — Progress Notes (Signed)
CSW spoke to patient's wife over the phone to see which facility patient and patient's wife chose. Wife states they would like [facility] because it is closest to family. CSW left voicemail for [facility] and is awaiting to hear if they can accept patient possibly today for dc.  Jeanette Caprice, MSW, [place]

## 2014-05-09 NOTE — Progress Notes (Signed)
Patient had short run of W.C.T. vs At. Fib. Patient sleeping. Cont. To monitor patient and rhythm. See stripes posted in computer.

## 2014-05-09 NOTE — Progress Notes (Signed)
NEURO HOSPITALIST PROGRESS NOTE   SUBJECTIVE:                                                                                                                        Complains of a frontal HA. Able to walk to the bathroom with his walker and nurses support. MRI brain revealed a clustered areas of small cortical infarctions in the medial left parietal lobe at the vertex consistent with small infarctions in a left MCA branch vessel territory. Cholesterol 139, triglycerides 131, HDL 28, LDL 85 TTE, CUS pending. On aspirin.  OBJECTIVE:                                                                                                                           Vital signs in last 24 hours: Temp:  [97.6 F (36.4 C)-98.3 F (36.8 C)] 98.1 F (36.7 C) (07/16 0432) Pulse Rate:  [58-61] 61 (07/16 0432) Resp:  [17-18] 18 (07/16 0432) BP: (138-148)/(55-89) 139/89 mmHg (07/16 0432) SpO2:  [95 %-96 %] 96 % (07/16 0432) Weight:  [87.5 kg (192 lb 14.4 oz)] 87.5 kg (192 lb 14.4 oz) (07/16 0333)  Intake/Output from previous day: 07/15 0701 - 07/16 0700 In: 960 [P.O.:960] Out: -  Intake/Output this shift:   Nutritional status: Sodium Restricted  Past Medical History  Diagnosis Date  . Hypertension   . Irregular heart beat   . Rectal bleed   . Mallory-Weiss tear 2011    treated with 6 units PRBCs. No longer on aspirin  . Carotid artery occlusion   . Zenker diverticulum   . Colon polyps   . Gastric hemorrhage   . CAD (coronary artery disease)   . Hearing loss   . Peripheral neuropathy    Neurologic Exam:  Mental Status:  Alert, oriented, thought content appropriate. Speech fluent without evidence of aphasia. Able to follow commands without difficulty.  Cranial Nerves:  II-Visual fields were normal.  III/IV/VI-Pupils were equal and reacted. Extraocular movements were full and conjugate.  V/VII-no facial numbness and no facial weakness.  VIII-normal.   X-normal speech and symmetrical palatal movement.  Motor: Mild intrinsic hand muscle weakness with mild to moderate atrophy bilaterally; no proximal upper extremity weakness; mild weakness (4/5 strength) left  hip flexors and quadriceps muscles bilaterally; 1/5 strength of dorsiflexors of the feet and 0/5 strength of plantar flexors.  Sensory: Normal throughout.  Deep Tendon Reflexes: Trace to 1+ and symmetric in upper extremities and absent knees and ankles.  Plantars: Mute bilaterally bilaterally  Cerebellar: Normal finger-to-nose testing Gait: unable to stand up and walk without assistance.  Lab Results: Lab Results  Component Value Date/Time   CHOL 139 05/09/2014  5:35 AM   Lipid Panel  Recent Labs  05/09/14 0535  CHOL 139  TRIG 131  HDL 28*  CHOLHDL 5.0  VLDL 26  LDLCALC 85    Studies/Results: Mr [facility] Contrast  05/08/2014   CLINICAL DATA:  Hypertension. A written may have. Bilateral leg weakness. Unable to walk.  EXAM: MRI HEAD WITHOUT CONTRAST  MRA HEAD WITHOUT CONTRAST  TECHNIQUE: Multiplanar, multiecho pulse sequences of the brain and surrounding structures were obtained without intravenous contrast. Angiographic images of the head were obtained using MRA technique without contrast.  COMPARISON:  Head CT 01/15/2009  FINDINGS: MRI HEAD FINDINGS  Diffusion imaging shows several sub cm foci of acute cortical infarction in the left medial parietal lobe towards the vertex. No large vessel territory infarction. No significant swelling. No hemorrhage.  Elsewhere, there are mild chronic small-vessel changes of the pons. No focal cerebellar insult. There are old small vessel ischemic changes affecting the thalami and the cerebral hemispheric white matter. No mass lesion, hydrocephalus or extra-axial collection. There appears to be a pituitary mass on the left side measuring 11 x 12 mm. This was not primarily or completely evaluated. No skull or skullbase lesion.  MRA HEAD FINDINGS   Both internal carotid arteries are widely patent into the brain. The anterior and middle cerebral vessels are patent but show some diffuse atherosclerotic irregularity. No evidence of correctable stenosis. The right vertebral artery is widely patent to the basilar and is dominant. The nondominant left vertebral artery shows a severe stenosis just proximal to the basilar. No basilar stenosis. Posterior circulation branch vessels are patent but show atherosclerotic irregularity.  IMPRESSION: Clustered areas of small cortical infarctions in the medial left parietal lobe at the vertex consistent with small infarctions in a left MCA branch vessel territory. No hemorrhage or swelling.  Chronic small vessel ischemic changes elsewhere throughout the brain.  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 This was not primarily or completely evaluated.  No major vessel occlusion. Diffuse intracranial irregularity of the medium size vessels consistent with diffuse atherosclerosis. Severe stenosis of the distal left vertebral artery just proximal to the basilar.   Electronically Signed   By: Nelson Chimes M.D.   On: 05/08/2014 11:25   [name]  05/08/2014   CLINICAL DATA:  Hypertension. A written may have. Bilateral leg weakness. Unable to walk.  EXAM: MRI HEAD WITHOUT CONTRAST  MRA HEAD WITHOUT CONTRAST  TECHNIQUE: Multiplanar, multiecho pulse sequences of the brain and surrounding structures were obtained without intravenous contrast. Angiographic images of the head were obtained using MRA technique without contrast.  COMPARISON:  Head CT 01/15/2009  FINDINGS: MRI HEAD FINDINGS  Diffusion imaging shows several sub cm foci of acute cortical infarction in the left medial parietal lobe towards the vertex. No large vessel territory infarction. No significant swelling. No hemorrhage.  Elsewhere, there are mild chronic small-vessel changes of the pons. No focal cerebellar insult. There are old  small vessel ischemic changes affecting the thalami and the cerebral hemispheric white  matter. No mass lesion, hydrocephalus or extra-axial collection. There appears to be a pituitary mass on the left side measuring 11 x 12 mm. This was not primarily or completely evaluated. No skull or skullbase lesion.  MRA HEAD FINDINGS  Both internal carotid arteries are widely patent into the brain. The anterior and middle cerebral vessels are patent but show some diffuse atherosclerotic irregularity. No evidence of correctable stenosis. The right vertebral artery is widely patent to the basilar and is dominant. The nondominant left vertebral artery shows a severe stenosis just proximal to the basilar. No basilar stenosis. Posterior circulation branch vessels are patent but show atherosclerotic irregularity.  IMPRESSION: Clustered areas of small cortical infarctions in the medial left parietal lobe at the vertex consistent with small infarctions in a left MCA branch vessel territory. No hemorrhage or swelling.  Chronic small vessel ischemic changes elsewhere throughout the brain.  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 This was not primarily or completely evaluated.  No major vessel occlusion. Diffuse intracranial irregularity of the medium size vessels consistent with diffuse atherosclerosis. Severe stenosis of the distal left vertebral artery just proximal to the basilar.   Electronically Signed   By: Nelson Chimes M.D.   On: 05/08/2014 11:25   Mr Thoracic Spine Wo Contrast  05/07/2014   CLINICAL DATA:  Lower extremity weakness.  EXAM: MRI THORACIC SPINE WITHOUT CONTRAST  TECHNIQUE: Multiplanar, multisequence MR imaging of the thoracic spine was performed. No intravenous contrast was administered.  COMPARISON:  Prior MRI of the lumbar spine performed earlier on the same day.  FINDINGS: The vertebral bodies are normally aligned with preservation of the normal thoracic kyphosis. Vertebral body  heights are well preserved. No fracture or listhesis.  Mild multilevel degenerative intervertebral disc space narrowing seen throughout the thoracic spine with associated chronic endplate Schmorl's nodes. No significant degenerative disc bulge identified. No focal disc protrusions. Mild multilevel facet arthropathy noted. No central canal stenosis or cord compression. No significant foraminal narrowing identified.  The visualized paraspinous soft tissues are within normal limits. Visualized lungs are clear.  IMPRESSION: No acute abnormality identified within the thoracic spine. No evidence of cord compression or other intrinsic spinal cord abnormality.   Electronically Signed   By: Jeannine Boga M.D.   On: 05/07/2014 23:38   Mr Lumbar Spine Wo Contrast  05/07/2014   CLINICAL DATA:  Sudden onset bilateral lower extremity weakness.  EXAM: MRI LUMBAR SPINE WITHOUT CONTRAST  TECHNIQUE: Multiplanar, multisequence MR imaging of the lumbar spine was performed. No intravenous contrast was administered.  COMPARISON:  03/10/2012  FINDINGS: Vertebral alignment is normal. Vertebral body heights are preserved without compression fracture. Moderate disc space narrowing and associated degenerative marrow changes are again seen at L4-5. Right-sided pars defect is again noted at L5. 10 mm T1 and T2 hypointense lesion in the L3 vertebral body is unchanged, sclerotic on prior CT. The conus medullaris is normal in signal and terminates at L1. Small T2 hyperintense lesion arising from the upper pole of the left kidney is unchanged, most likely a cyst.  T12-L1 and L1-2:  Negative.  L2-3: Mild disc bulge and facet and ligamentum flavum hypertrophy without spinal canal or neural foraminal stenosis, unchanged.  L3-4: Mild disc bulge and facet and ligamentum flavum hypertrophy result in mild spinal stenosis, mild left greater the right lateral recess stenosis, and mild left neural foraminal narrowing, unchanged.  L4-5: Prior  laminectomies with decompressed spinal canal. Disc bulging and spurring asymmetric  to the right and mild facet and ligamentum flavum hypertrophy result in mild right neural foraminal stenosis, unchanged.  L5-S1: Mild facet hypertrophy result in mild left greater than right neural foraminal narrowing without spinal stenosis, unchanged.  IMPRESSION: Unchanged appearance of the lumbar spine without acute abnormality identified. Moderate multilevel spondylosis resulting in mild spinal canal and lateral recess stenosis at L3-4 and mild right neural foraminal stenosis at L4-5.   Electronically Signed   By: Logan Bores   On: 05/07/2014 19:04    MEDICATIONS                                                                                                                        Scheduled: . aspirin  325 mg Oral Daily  . carvedilol  3.125 mg Oral BID  . heparin  5,000 Units Subcutaneous 3 times per day  . lisinopril  20 mg Oral Daily    ASSESSMENT/PLAN:                                                                                                           80 year old man with known peripheral neuropathy and weakness distally in both lower extremities admitted to [facility] due to worsening bilateral LE weakness that according to patient developed rather abruptly on 7/14. MRI thoraco-lumbar refions unimpressive but MRI brain revealed clustered areas of small cortical infarctions in the medial left parietal lobe at the vertex consistent with small infarctions in a left MCA branch vessel territory. Stroke work up underway. Of note, patient reports prior GI bleeding on aspirin couple of years ago and thus will stop aspirin and switch to plavix pending results of stroke work up. Will ask our stroke team to follow up tomorrow.  Dorian Pod, MD [facility] [phone]  05/09/2014, 8:50 AM

## 2014-05-09 NOTE — Progress Notes (Signed)
  Echocardiogram 2D Echocardiogram has been performed.  Manuel [name] 05/09/2014, 12:38 PM

## 2014-05-10 DIAGNOSIS — I6330 Cerebral infarction due to thrombosis of unspecified cerebral artery: Secondary | ICD-10-CM

## 2014-05-10 NOTE — Progress Notes (Signed)
TEE scheduled for Monday 05/13/14 at 10am with Dr. Sallyanne Kuster. Orders written. I have also made EP team aware of need for loop recorder.  Soua Lenk PA-C

## 2014-05-10 NOTE — Consult Note (Addendum)
CARDIOLOGY CONSULT NOTE     Patient ID: Manuel [name] MRN: [id number] DOB/AGE: 1922/02/18 80 y.o.  Admit date: 05/07/2014 Referring Physician Domenic Polite MD Primary Physician Shirline Frees, MD Primary Cardiologist Liam Rogers MD Reason for Consultation CVA  HPI: Mr. Manuel [name] is a pleasant 80 yo WM we are asked to see by Neurology for our opinion concerning evaluation and Rx of recent CVA. He has a history of HTN. He also has a history of acute UGI bleed on ASA. He was on no antiplatelet therapy prior to this admission. He presented with acute leg weakness. Work up demonstrated multiple infarcts in the left MCA territory by MRI. Patient reports a couple of episodes of heart racing. This was over 6-12 months ago and resolved with sitting up. No recent palpitations. Denies chest pain or SOB.  Past Medical History  Diagnosis Date  . Hypertension   . Irregular heart beat   . Rectal bleed   . Mallory-Weiss tear 2011    treated with 6 units PRBCs. No longer on aspirin  . Carotid artery occlusion   . Zenker diverticulum   . Colon polyps   . Gastric hemorrhage   . CAD (coronary artery disease)   . Hearing loss   . Peripheral neuropathy     Family History  Problem Relation Age of Onset  . Cancer Mother     Deceased, 77  . Heart attack Father     Deceased, 41  . Heart disease Sister     Heart Disease before age 65  . Diabetes Son   . Parkinson's disease Sister   . Stroke Other     Deceased, siblings  . Heart disease Other     Deceased, siblings    History   Social History  . Marital Status: Married    Spouse Name: N/A    Number of Children: N/A  . Years of Education: N/A   Occupational History  . Not on file.   Social History Main Topics  . Smoking status: Never Smoker   . Smokeless tobacco: Never Used  . Alcohol Use: No  . Drug Use: No  . Sexual Activity: Not Currently   Other Topics Concern  . Not on file   Social History Narrative   He lives with  wife and 69-year old grand daughter.     He is a retired Engineer, structural.   Highest level of education:  Some college    Past Surgical History  Procedure Laterality Date  . Back surgery    . Appendectomy    . Abdominal surgery    . Hernia repair       Prescriptions prior to admission  Medication Sig Dispense Refill  . acetaminophen (TYLENOL) 650 MG CR tablet Take 650 mg by mouth every 8 (eight) hours as needed for pain (give with docusate for stool softner).      . carvedilol (COREG) 3.125 MG tablet Take 1 tablet (3.125 mg total) by mouth 2 (two) times daily.  60 tablet  6  . Cholecalciferol (VITAMIN D) 2000 UNITS tablet Take 2,000 Units by mouth daily.      . Coenzyme Q10 (COQ10) 200 MG CAPS Take 200 mg by mouth daily.      Marland Kitchen docusate sodium (COLACE) 100 MG capsule Take 100 mg by mouth daily as needed for mild constipation (give with tylenol arthritis for a stool softner).      Marland Kitchen lisinopril (PRINIVIL,ZESTRIL) 20 MG tablet Take 1 tablet (20 mg total) by  mouth daily. TAKE 1 TABLET BY MOUTH DAILY.  30 tablet  11  . Multiple Vitamin (MULTIVITAMIN WITH MINERALS) TABS tablet Take 1 tablet by mouth daily. NutraLite      . OMEGA 3-6-9 FATTY ACIDS PO Take 1 capsule by mouth daily.      . vitamin C (ASCORBIC ACID) 500 MG tablet Take 250 mg by mouth at bedtime.       . vitamin E 400 UNIT capsule Take 400 Units by mouth daily.          ROS: As noted in HPI. All other systems are reviewed and are negative unless otherwise mentioned.   Physical Exam: Blood pressure 164/66, pulse 65, temperature 98.4 F (36.9 C), temperature source Oral, resp. rate 18, height 6\' 2"  (1.88 m), weight 193 lb 9.6 oz (87.816 kg), SpO2 94.00%. Current Weight  05/10/14 193 lb 9.6 oz (87.816 kg)  03/22/14 194 lb (87.998 kg)  03/01/14 198 lb (89.812 kg)    GENERAL:  Well appearing elderly WM in NAD.  HEENT:  PERRL, EOMI, sclera are clear. Oropharynx is clear. NECK:  No jugular venous distention, carotid upstroke brisk  and symmetric, no bruits, no thyromegaly or adenopathy LUNGS:  Clear to auscultation bilaterally CHEST:  Unremarkable HEART:  RRR,  PMI not displaced or sustained,S1 and S2 within normal limits, no S3, no S4: no clicks, no rubs, no murmurs ABD:  Soft, nontender. BS +, no masses or bruits. No hepatomegaly, no splenomegaly EXT:  2 + pulses throughout, no edema, no cyanosis no clubbing SKIN:  Warm and dry.  No rashes NEURO:  Alert and oriented x 3. Cranial nerves II through XII intact. PSYCH:  Cognitively intact    Labs:   Lab Results  Component Value Date   WBC 6.6 05/08/2014   HGB 13.4 05/08/2014   HCT 40.7 05/08/2014   MCV 87.7 05/08/2014   PLT 202 05/08/2014    Recent Labs Lab 05/08/14 0537  NA 143  K 4.5  CL 106  CO2 22  BUN 19  CREATININE 1.05  CALCIUM 9.2  PROT 6.8  BILITOT 0.5  ALKPHOS 52  ALT 11  AST 16  GLUCOSE 96   Lab Results  Component Value Date   CKTOTAL 64 08/22/2009   CKTOTAL 29 08/10/2009   CKTOTAL 42 08/10/2009   CKMB 1.8 08/22/2009   CKMB 1.8 08/10/2009   CKMB 2.0 08/10/2009   TROPONINI <0.30 03/10/2012   TROPONINI  Value: 0.01        NO INDICATION OF MYOCARDIAL INJURY. 08/22/2009   TROPONINI  Value: 0.01        NO INDICATION OF MYOCARDIAL INJURY. 08/10/2009    Lab Results  Component Value Date   CHOL 139 05/09/2014   Lab Results  Component Value Date   HDL 28* 05/09/2014   Lab Results  Component Value Date   LDLCALC 85 05/09/2014   Lab Results  Component Value Date   TRIG 131 05/09/2014   Lab Results  Component Value Date   CHOLHDL 5.0 05/09/2014   No results found for this basename: LDLDIRECT    No results found for this basename: PROBNP   Lab Results  Component Value Date   TSH 2.16 01/16/2014   Lab Results  Component Value Date   HGBA1C 5.9* 05/08/2014    Radiology: MRI HEAD WITHOUT CONTRAST  MRA HEAD WITHOUT CONTRAST  TECHNIQUE:  Multiplanar, multiecho pulse sequences of the brain and surrounding  structures were  obtained without intravenous contrast. Angiographic  images of the head were obtained using MRA technique without  contrast.  COMPARISON: Head CT 01/15/2009  FINDINGS:  MRI HEAD FINDINGS  Diffusion imaging shows several sub cm foci of acute cortical  infarction in the left medial parietal lobe towards the vertex. No  large vessel territory infarction. No significant swelling. No  hemorrhage.  Elsewhere, there are mild chronic small-vessel changes of the pons.  No focal cerebellar insult. There are old small vessel ischemic  changes affecting the thalami and the cerebral hemispheric white  matter. No mass lesion, hydrocephalus or extra-axial collection.  There appears to be a pituitary mass on the left side measuring 11 x  12 mm. This was not primarily or completely evaluated. No skull or  skullbase lesion.  MRA HEAD FINDINGS  Both internal carotid arteries are widely patent into the brain. The  anterior and middle cerebral vessels are patent but show some  diffuse atherosclerotic irregularity. No evidence of correctable  stenosis. The right vertebral artery is widely patent to the basilar  and is dominant. The nondominant left vertebral artery shows a  severe stenosis just proximal to the basilar. No basilar stenosis.  Posterior circulation branch vessels are patent but show  atherosclerotic irregularity.  IMPRESSION:  Clustered areas of small cortical infarctions in the medial left  parietal lobe at the vertex consistent with small infarctions in a  left MCA branch vessel territory. No hemorrhage or swelling.  Chronic small vessel ischemic changes elsewhere throughout the  brain.  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  This was not primarily or completely evaluated.  No major vessel occlusion. Diffuse intracranial irregularity of the  medium size vessels consistent with diffuse atherosclerosis. Severe  stenosis of the distal left vertebral artery just  proximal to the  basilar.  Electronically Signed  By: Nelson Chimes M.D.  On: 05/08/2014 11:25  EKG: NSR, normal.  Carotid Duplex Study  Patient: Josephus, Harriger MR #: [id number] Study Date: 05/09/2014 Gender: M Age: 71 Height: Weight: BSA: Pt. Status: Room: 2W27C  SONOGRAPHER Delman Kitten, RCS ATTENDING Domenic Polite [address] [id number], JJHERDE [id number] Ronita Hipps [id number]  Reports also to:  ------------------------------------------------------------------- History and indications:  Indications  CVA/Stroke. Prior study from 12/12/2013 is available for comparison.  ------------------------------------------------------------------- Study information:  Study status: Routine. Procedure: The right CCA, right ECA, right ICA, right vertebral, left CCA, left ECA, left ICA, and left vertebral arteries were examined. A vascular evaluation was performed with the patient in the supine position. The study was technically limited. Carotid duplex study. Carotid Duplex exam including 2D imaging, color and spectral Doppler were performed on the extracranial carotid and vertebral arteries using standard established protocols. Birthdate: Patient birthdate: 1922/07/04. Age: Patient is 80 yr old. Sex: Gender: male. Study date: Study date: 05/09/2014. Study time: 03:48 PM. Location: Bedside. Patient status: Inpatient.  Arterial flow:  +---------------+---------+--------+--------------+---------------+ Location V sys V ed Flow analysis Comment  +---------------+---------+--------+--------------+---------------+ Right CCA - 115 cm/s 11 cm/s --------------Trivial intimal proximal    thickening.  +---------------+---------+--------+--------------+---------------+ Right CCA - -118 cm/s-13 cm/s--------------Mild  distal    heterogenous      plaque.   +---------------+---------+--------+--------------+---------------+ Right ECA 215 cm/s 0 cm/s ----------------------------- +---------------+---------+--------+--------------+---------------+ Right ICA - -184 cm/s-16 cm/s--------------Mild to  proximal    moderate      heterogenous      plaque.  +---------------+---------+--------+--------------+---------------+ Right ICA - mid277 cm/s 34 cm/s --------------Moderate      homogenous  plaque.  +---------------+---------+--------+--------------+---------------+ Right ICA - -150 cm/s-22 cm/s----------------------------- distal      +---------------+---------+--------+--------------+---------------+ Right vertebral57 cm/s 13 cm/s Antegrade flow--------------- +---------------+---------+--------+--------------+---------------+ Left CCA - 102 cm/s 9 cm/s ----------------------------- proximal      +---------------+---------+--------+--------------+---------------+ Left CCA - 183 cm/s 18 cm/s --------------Mild to  distal    moderate      diffuse mixed      plaque.      Localized      anechoic area      within plaque.      Cannot rule out     ulcerated      plaque.  +---------------+---------+--------+--------------+---------------+ Left ECA -281 cm/s0 cm/s --------------Mild      heterogenous      plaque at the      origin.  +---------------+---------+--------+--------------+---------------+ Left ICA - 248 cm/s 30 cm/s --------------Mild to  proximal    moderate mixed      plaque at the      origin.  +---------------+---------+--------+--------------+---------------+ Left ICA - mid -138 cm/s-25 cm/s----------------------------- +---------------+---------+--------+--------------+---------------+ Left ICA - -126 cm/s-17  cm/s----------------------------- distal      +---------------+---------+--------+--------------+---------------+ Left vertebral -86 cm/s -13 cm/sAntegrade flow--------------- +---------------+---------+--------+--------------+---------------+ Right ICA/CCA -------------------------------2.35  ratio      +---------------+---------+--------+--------------+---------------+ Left ICA/CCA -------------------------------1.36  ratio      +---------------+---------+--------+--------------+---------------+  ------------------------------------------------------------------- Summary:  - The vertebral arteries appear patent with antegrade flow. - Findings consistent with 40 - 59 percent stenosis, upper end of scale, involving the right internal carotid artery. - Findings consistent with 51 - 8 percent stenosis involving the left internal carotid artery. - Technically difficult due to possible arrhythmia.  Other specific details can be found in the table(s) above. Prepared and Electronically Authenticated by  Adele Barthel, MD 2015-07-17T05:46:35      Echo:Study Conclusions  - Left ventricle: The cavity size was normal. Wall thickness was normal. Systolic function was normal. The estimated ejection fraction was in the range of 60% to 65%. - Aortic valve: There was mild regurgitation. - Left atrium: The atrium was moderately dilated.    ASSESSMENT AND PLAN:   1. Recent left MCA CVA. Possibly embolic. Patient does have bilateral carotid disease so this could represent embolism from the carotid artery or aorta. Concern about possibility of Atrial fibrillation but no documented history of arrhythmia. I would not anticoagulate just based on history of remote palpitations especially given history of GI bleed. I would recommend TEE and loop recorder. We will schedule. If Afib diagnosed or if he has LA thrombus then would need to consider anticoagulation but  I would continue Plavix for now.  2. HTN. 3. Peripheral neuropathy. 4. Carotid arterial disease. Although lipids look fairly good I would consider low dose statin therapy for risk reduction.   Signed: Keirston Saephanh [place], [place]  05/10/2014, 1:34 PM

## 2014-05-10 NOTE — Progress Notes (Signed)
Occupational Therapy Treatment Patient Details Name: Manuel [name] MRN: [id number] DOB: 16-Dec-1921 Today's Date: 05/10/2014    History of present illness 80 yo male with bil LE weakness progressivel Past medical history of hypertension, irregular heartbeat, coronary artery disease, peripheral neuropathy.y worsening over week. MRI now + for lt parietal CVA.   OT comments  Pt was able to perform sit to stand with min/mod assist during session.  Amount of assist was dependent on the height of the surface as well as if it had arm support.  Overall decreased ability to transition from sitting, especially with timing of hip and trunk extension.  He relies on UE support to achieve full knee, hip, and trunk extension.  Follow Up Recommendations  Supervision/Assistance - 24 hour;SNF          Precautions / Restrictions Precautions Precautions: Fall Precaution Comments: bilateral foot drop Restrictions Weight Bearing Restrictions: No       Mobility Bed Mobility Overal bed mobility: Needs Assistance Bed Mobility: Supine to Sit     Supine to sit: Supervision     General bed mobility comments: Pt utilized rail for supine to sit EOB.   Transfers Overall transfer level: Needs assistance Equipment used: Rolling walker (2 wheeled) Transfers: Sit to/from Stand Sit to Stand: Min assist;Mod assist         General transfer comment: Pt with min assist for sit to stand from higher bed and with the RW placed in front of him.  Mod assist for sit to stand from bedside chair or from bed without use of the walker    Balance Overall balance assessment: Needs assistance Sitting-balance support: Feet supported Sitting balance-Leahy Scale: Poor Sitting balance - Comments: Pt with posterior lean and LOB when attempting to bring LEs up to the bed for donning gripper socks Postural control: Posterior lean Standing balance support: Bilateral upper extremity supported Standing balance-Leahy Scale:  Poor                     ADL Overall ADL's : Needs assistance/impaired Eating/Feeding: Independent   Grooming: Wash/dry hands;Minimal assistance;Standing               Lower Body Dressing: Moderate assistance Lower Body Dressing Details (indicate cue type and reason): donned gripper socks sitting EOB.  therapist had to assist with balance and for lifting and stabilizing the right foot propped up on the bedside chair.  Toilet Transfer: Minimal assistance;RW;Ambulation;Comfort height toilet;Grab bars   Toileting- Clothing Manipulation and Hygiene: Minimal assistance;Sit to/from stand       Functional mobility during ADLs: Minimal assistance General ADL Comments: Pt with increased difficulty with sit to stand from the EOB and from the bedside chair.  He is unable to extend his trunk once his knees are extended unless he had bilateral UE support to assist.  Also with decreased forward weightshift and timing of knee/hip extension.                Cognition   Behavior During Therapy: WFL for tasks assessed/performed Overall Cognitive Status: Within Functional Limits for tasks assessed                                    Pertinent Vitals/ Pain       No report of pain during session.         Frequency Min 2X/week     Progress Toward Goals  OT Goals(current goals can now be found in the care plan section)  Progress towards OT goals: Progressing toward goals     Plan Discharge plan remains appropriate       End of Session Equipment Utilized During Treatment: Gait belt   Activity Tolerance Patient tolerated treatment well   Patient Left in chair;with call bell/phone within reach;with family/visitor present   Nurse Communication Mobility status      Time: 4920-1007 OT Time Calculation (min): 36 min  Charges: OT General Charges $OT Visit: 1 Procedure OT Treatments $Self Care/Home Management : 23-37 mins  Chrystie Hagwood OTR/L 05/10/2014,  5:38 PM

## 2014-05-10 NOTE — Progress Notes (Signed)
Physical Therapy Treatment Patient Details Name: CHIPPER KOUDELKA MRN: [id number] DOB: 03-17-1922 Today's Date: 2014/06/05    History of Present Illness 80 yo male with bil LE weakness progressivel Past medical history of hypertension, irregular heartbeat, coronary artery disease, peripheral neuropathy.y worsening over week. MRI now + for lt parietal CVA.    PT Comments    Pt making excellent progress. Now with Lt parietal CVA diagnosis.  Follow Up Recommendations   (if bed available)     Equipment Recommendations  Rolling walker with 5" wheels    Recommendations for Other Services       Precautions / Restrictions Precautions Precautions: Fall Precaution Comments: BIL AFO due to foot drop - doesn't use Restrictions Weight Bearing Restrictions: No    Mobility  Bed Mobility Overal bed mobility: Needs Assistance Bed Mobility: Supine to Sit     Supine to sit: Min assist     General bed mobility comments: Assist to bring trunk up.  Transfers Overall transfer level: Needs assistance Equipment used: Rolling walker (2 wheeled) Transfers: Sit to/from Stand Sit to Stand: Min assist         General transfer comment: Assist to bring hips up and for balance.  Ambulation/Gait Ambulation/Gait assistance: Min assist Ambulation Distance (Feet): 150 Feet Assistive device: Rolling walker (2 wheeled) Gait Pattern/deviations: Step-through pattern;Decreased stride length;Decreased dorsiflexion - right;Decreased dorsiflexion - left;Trunk flexed Gait velocity: decr Gait velocity interpretation: Below normal speed for age/gender General Gait Details: Pt with steppage gait due to foot drop. Verbal cues to stand more erect.   Stairs            Wheelchair Mobility    Modified Rankin (Stroke Patients Only)       Balance   Sitting-balance support: Bilateral upper extremity supported Sitting balance-Leahy Scale: Poor Sitting balance - Comments: Posterior lean when  engaged in activity. Postural control: Posterior lean Standing balance support: Bilateral upper extremity supported Standing balance-Leahy Scale: Poor Standing balance comment: Support of walker and min A.                    Cognition Arousal/Alertness: Awake/alert Behavior During Therapy: WFL for tasks assessed/performed Overall Cognitive Status: Within Functional Limits for tasks assessed                      Exercises      General Comments        Pertinent Vitals/Pain No c/o's     Home Living                      Prior Function            PT Goals (current goals can now be found in the care plan section) Progress towards PT goals: Progressing toward goals;Goals met and updated - see care plan    Frequency  Min 3X/week    PT Plan Discharge plan needs to be updated    Co-evaluation             End of Session Equipment Utilized During Treatment: Gait belt Activity Tolerance: Patient tolerated treatment well Patient left: in chair;with call bell/phone within reach;with family/visitor present     Time: 1021-1031 PT Time Calculation (min): 10 min  Charges:  $Gait Training: 8-22 mins                    G Codes:      Sierra Spargo 2014/06/05, 11:24 AM  Suanne Marker PT  [phone]   

## 2014-05-10 NOTE — Progress Notes (Signed)
[facility] PROGRESS NOTE  Manuel [name] HWE:[id number] DOB: Jun 01, 1922 DOA: 05/07/2014 PCP: Shirline Frees, MD  Assessment/Plan: 1. Acute CVA-L pareital lobe -has long standing h/o Peripheral Neuropathy and gait disorder -MRI T spine normal -Carotid Doppler Right: 40-59%,ICA Left: 40-59% ICA stenosis. Bilateral: Vertebral artery flow is antegrade.  -2D Echocardiogram EF 60-65% with no source of embolus.  -LDL: 85, hbaic 5.9 -Neuro following, now recommending Cards consult to eval for Anticoagulation given suspected AFib vs TEE/Loop -Pt/OT eval completed, SNF vs CIR recommended -now on Plavix  2. HTN -stable, continue ACE  3. H/o Chronic constipation -continue colace  4. CAD -stable, continue coreg  5. Pituitary adenoma  incidental finding on MRI Given age, location, doubt surgery is indicated.  Has seen Dr. Vertell Limber in the past. Can followup as OP   DVT proph: hep SQ  Code Status: Full Code Family Communication:no family at bedside Disposition Plan: SNF vs CIR   Consultants:  NEuro  HPI/Subjective: No complaints, chronic neuropathy symptoms  Objective: Filed Vitals:   05/10/14 1035  BP: 164/66  Pulse:   Temp:   Resp:     Intake/Output Summary (Last 24 hours) at 05/10/14 1233 Last data filed at 05/09/14 2130  Gross per 24 hour  Intake    720 ml  Output      0 ml  Net    720 ml   Filed Weights   05/07/14 2352 05/09/14 0333 05/10/14 0610  Weight: 88.905 kg (196 lb) 87.5 kg (192 lb 14.4 oz) 87.816 kg (193 lb 9.6 oz)    Exam:   General:  AAOx3  Cardiovascular: S1S2/RRR  Respiratory: CTAB  Abdomen: soft, Nt, BS present  Musculoskeletal: no edema c/c  NEuro: periph neuropathy, diminished DTR and mute plantars, rest non focal   Data Reviewed: Basic Metabolic Panel:  Recent Labs Lab 05/07/14 1531 05/08/14 0537  NA 141 143  K 4.9 4.5  CL 105 106  CO2 24 22  GLUCOSE 93 96  BUN 22 19  CREATININE 1.05 1.05  CALCIUM 9.2 9.2    Liver Function Tests:  Recent Labs Lab 05/08/14 0537  AST 16  ALT 11  ALKPHOS 52  BILITOT 0.5  PROT 6.8  ALBUMIN 3.7   No results found for this basename: LIPASE, AMYLASE,  in the last 168 hours No results found for this basename: AMMONIA,  in the last 168 hours CBC:  Recent Labs Lab 05/07/14 1531 05/08/14 0537  WBC 6.7 6.6  HGB 13.8 13.4  HCT 41.2 40.7  MCV 87.8 87.7  PLT 187 202   Cardiac Enzymes: No results found for this basename: CKTOTAL, CKMB, CKMBINDEX, TROPONINI,  in the last 168 hours BNP (last 3 results) No results found for this basename: PROBNP,  in the last 8760 hours CBG: No results found for this basename: GLUCAP,  in the last 168 hours  No results found for this or any previous visit (from the past 240 hour(s)).   Studies: No results found.  Scheduled Meds: . carvedilol  3.125 mg Oral BID  . clopidogrel  75 mg Oral Daily  . heparin  5,000 Units Subcutaneous 3 times per day  . lisinopril  20 mg Oral Daily   Continuous Infusions:  Antibiotics Given (last 72 hours)   None      Principal Problem:   Bilateral leg weakness Active Problems:   PERIPHERAL NEUROPATHY   HYPERTENSION   CAD   Peripheral vascular disease, unspecified   Lower extremity weakness   CVA (cerebral  infarction)   Acute CVA (cerebrovascular accident)    Time spent: 21min    Desani Sprung  [facility] Pager [phone]. If 7PM-7AM, please contact night-coverage at [email or web address], password [facility] 05/10/2014, 12:33 PM  LOS: 3 days

## 2014-05-10 NOTE — Consult Note (Signed)
Physical Medicine and Rehabilitation Consult  Reason for Consult: BLE weakness Referring Physician:  Dr. Broadus John   HPI: Manuel [name] is a 80 y.o. male with history of HTN, CAD, GIB, peripheral neuropathy with B-foot drop X 30 years; who was admitted on 05/07/14 with worsening of BLE weakness with inability to walk. MRI lumbar and thoracic spine with moderate multilevel spondylosis with mild stenosis L3/4 and L4/5.  MRI/MRA brain with clustered areas of cortical infarct in medial left parietal lobe c/w L-MCA territory infarct and no major vessel occlusion. 2D echo with EF 60-65% with mild AVR. Carotid dopplers with 40-59% B-ICA stenosis. Placed on plavix for secondary stroke prevention and TEE recommended by Neurology for further work up. Therapies ongoing and CIR recommended by   At baseline: Patient independent without AD. Does not wear foot orthosis. Involved in multiple volunteer groups. Uses electric WC when out of home for longer distances.    Review of Systems  HENT: Negative for hearing loss.   Eyes: Negative for blurred vision and double vision.  Respiratory: Negative for cough, shortness of breath and wheezing.   Cardiovascular: Negative for chest pain and palpitations.  Gastrointestinal: Positive for heartburn. Negative for nausea, vomiting and abdominal pain.  Genitourinary: Negative for urgency and frequency.  Musculoskeletal: Negative for back pain, myalgias and neck pain.  Neurological: Positive for focal weakness. Negative for dizziness, tingling and headaches.     Past Medical History  Diagnosis Date  . Hypertension   . Irregular heart beat   . Rectal bleed   . Mallory-Weiss tear 2011    treated with 6 units PRBCs. No longer on aspirin  . Carotid artery occlusion   . Zenker diverticulum   . Colon polyps   . Gastric hemorrhage   . CAD (coronary artery disease)   . Hearing loss   . Peripheral neuropathy     Past Surgical History  Procedure Laterality  Date  . Back surgery    . Appendectomy    . Abdominal surgery    . Hernia repair      Family History  Problem Relation Age of Onset  . Cancer Mother     Deceased, 10  . Heart attack Father     Deceased, 11  . Heart disease Sister     Heart Disease before age 100  . Diabetes Son   . Parkinson's disease Sister   . Stroke Other     Deceased, siblings  . Heart disease Other     Deceased, siblings    Social History:   Married--wife 75 years old with PD but can provide supervision past discharge. 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 reports that he has never smoked. He has never used smokeless tobacco. He reports that he does not drink alcohol or use illicit drugs.   Allergies  Allergen Reactions  . Adhesive [Tape]     Plastic tape= tears skin  . Codeine Other (See Comments)    Pass out  . Ivp Dye [Iodinated Diagnostic Agents] Other (See Comments)    Doesn't remember reaction but it was severe  . Penicillins Hives and Itching  . Rofecoxib Other (See Comments)     elevated BP (Vioxx)  . Sulfonamide Derivatives Hives and Itching    Medications Prior to Admission  Medication Sig Dispense Refill  . acetaminophen (TYLENOL) 650 MG CR tablet Take 650 mg by mouth every 8 (eight) hours as needed for pain (give with docusate for stool softner).      Marland Kitchen  carvedilol (COREG) 3.125 MG tablet Take 1 tablet (3.125 mg total) by mouth 2 (two) times daily.  60 tablet  6  . Cholecalciferol (VITAMIN D) 2000 UNITS tablet Take 2,000 Units by mouth daily.      . Coenzyme Q10 (COQ10) 200 MG CAPS Take 200 mg by mouth daily.      Marland Kitchen docusate sodium (COLACE) 100 MG capsule Take 100 mg by mouth daily as needed for mild constipation (give with tylenol arthritis for a stool softner).      Marland Kitchen lisinopril (PRINIVIL,ZESTRIL) 20 MG tablet Take 1 tablet (20 mg total) by mouth daily. TAKE 1 TABLET BY MOUTH DAILY.  30 tablet  11  . Multiple Vitamin (MULTIVITAMIN WITH MINERALS) TABS tablet Take 1 tablet by mouth daily.  NutraLite      . OMEGA 3-6-9 FATTY ACIDS PO Take 1 capsule by mouth daily.      . vitamin C (ASCORBIC ACID) 500 MG tablet Take 250 mg by mouth at bedtime.       . vitamin E 400 UNIT capsule Take 400 Units by mouth daily.        Home: Home Living Family/patient expects to be discharged to:: Private residence Living Arrangements: Spouse/significant other Available Help at Discharge: Available 24 hours/day Type of Home: House Home Access: Stairs to enter [employer] of Steps: 4 Home Layout: One [place]: Walker - standard;Cane - single point;Electric scooter (broom stick uses all the time) Additional Comments: Pt and wife are main caregivers for 78 yo great grandchild. Pt reports "she takes care of [place]"  Functional History: Prior Function Level of Independence: Independent with assistive device(s) Comments: per pt report he uses no DME in the house and broom stick handle to walk to the mailbox.  Functional Status:  Mobility: Bed Mobility Overal bed mobility: Needs Assistance Bed Mobility: Supine to Sit Rolling: Max assist Sidelying to sit: Max assist Supine to sit: Min assist General bed mobility comments: Assist to bring trunk up. Transfers Overall transfer level: Needs assistance Equipment used: Rolling walker (2 wheeled) Transfers: Sit to/from Stand Sit to Stand: Min assist General transfer comment: Assist to bring hips up and for balance. Ambulation/Gait Ambulation/Gait assistance: Min assist Ambulation Distance (Feet): 150 Feet Assistive device: Rolling walker (2 wheeled) Gait Pattern/deviations: Step-through pattern;Decreased stride length;Decreased dorsiflexion - right;Decreased dorsiflexion - left;Trunk flexed Gait velocity: decr Gait velocity interpretation: Below normal speed for age/gender General Gait Details: Pt with steppage gait due to foot drop. Verbal cues to stand more erect.    ADL: ADL Overall ADL's : Needs  assistance/impaired Eating/Feeding: Set up;Sitting Eating/Feeding Details (indicate cue type and reason): pt with breakfast present and unaware Grooming: Wash/dry face;Set up;Bed level Grooming Details (indicate cue type and reason): used wash cloth to help arouse patient from sleeping Lower Body Dressing: Maximal assistance (sitting EOB) Lower Body Dressing Details (indicate cue type and reason): pt with immediate LOB posteriorly. Pt required MAx (A) to maintain static sitting EOB to reach for bil LE.  Toilet Transfer: Maximal assistance (stedy used) General ADL Comments: Pt attempted exiting the bed and was unable to do so. Pt required max (A) for bed mobility. pt with immediate lob posteriorly at EOB. Pt required (A) for LB dressing. pt attempting sit<>Stand x2 with immediate LOB with static standing. pt reports "feels like bone on bone in there" pt could not maintain a static eob standing sustain position. Pt required stedy to transfer to chair safely.   Cognition: Cognition Overall Cognitive Status: Within Functional  Limits for tasks assessed Orientation Level: Oriented X4 Cognition Arousal/Alertness: Awake/alert Behavior During Therapy: WFL for tasks assessed/performed Overall Cognitive Status: Within Functional Limits for tasks assessed  Blood pressure 164/66, pulse 65, temperature 98.4 F (36.9 C), temperature source Oral, resp. rate 18, height 6\' 2"  (1.88 m), weight 87.816 kg (193 lb 9.6 oz), SpO2 94.00%. Physical Exam  Nursing note and vitals reviewed. Constitutional: He is oriented to person, place, and time. He appears well-developed and well-nourished.  HENT:  Head: Normocephalic and atraumatic.  Eyes: Conjunctivae are normal. Pupils are equal, round, and reactive to light.  Neck: Normal range of motion. Neck supple.  Cardiovascular: Normal rate and regular rhythm.   No murmur heard. Respiratory: Effort normal and breath sounds normal. No respiratory distress. He has no  wheezes.  GI: Soft. Bowel sounds are normal. He exhibits no distension. There is no tenderness.  Musculoskeletal: He exhibits no edema and no tenderness.  Stasis changes bilateral shins with evidence of muscle wasting.   Neurological: He is alert and oriented to person, place, and time.  Speech clear. Follow commands without difficulty. BLE weakness with bilateral foot drop. UES 5/5. RLE: HF 3/5, KE 4-, ADF 0, APF 1. LLE HF 4/5, KE 4+, ADF 0, APF 1. Slight decrease to PP and LT below the knees to feet. Cognitively appears appropriate.   Skin: Skin is warm and dry.  Chronic vascular changes in the legs.   Psychiatric: He has a normal mood and affect. His behavior is normal. Judgment and thought content normal.    No results found for this or any previous visit (from the past 24 hour(s)). No results found.  Assessment/Plan: Diagnosis: left MCA infarct, bilateral severe peripheral neuropathy with foot drop 1. Does the need for close, 24 hr/day medical supervision in concert with the patient's rehab needs make it unreasonable for this patient to be served in a less intensive setting? Yes 2. Co-Morbidities requiring supervision/potential complications: cad, pvd,  3. Due to bladder management, bowel management, safety, skin/wound care, disease management, medication administration, pain management and patient education, does the patient require 24 hr/day rehab nursing? Yes 4. Does the patient require coordinated care of a physician, rehab nurse, PT (1-2 hrs/day, 5 days/week) and OT (1-2 hrs/day, 5 days/week) to address physical and functional deficits in the context of the above medical diagnosis(es)? Yes Addressing deficits in the following areas: balance, endurance, locomotion, strength, transferring, bowel/bladder control, bathing, dressing, feeding, grooming, toileting and psychosocial support 5. Can the patient actively participate in an intensive therapy program of at least 3 hrs of therapy per  day at least 5 days per week? Yes 6. The potential for patient to make measurable gains while on inpatient rehab is excellent 7. Anticipated functional outcomes upon discharge from inpatient rehab are modified independent  with PT, modified independent with OT, n/a with SLP. 8. Estimated rehab length of stay to reach the above functional goals is: 7 days 9. Does the patient have adequate social supports to accommodate these discharge functional goals? Yes 10. Anticipated D/C setting: Home 11. Anticipated post D/C treatments: HH therapy and Outpatient therapy 12. Overall Rehab/Functional Prognosis: excellent  RECOMMENDATIONS: This patient's condition is appropriate for continued rehabilitative care in the following setting: CIR Patient has agreed to participate in recommended program. Yes Note that insurance prior authorization may be required for reimbursement for recommended care.  Comment: Rehab Admissions Coordinator to follow up.  Thanks,  Meredith Staggers, MD, Mellody Drown     05/10/2014

## 2014-05-10 NOTE — Progress Notes (Signed)
Stroke Team Progress Note  HISTORY Manuel [name] is an 80 y.o. male history of hypertension, coronary artery disease, carotid occlusion and peripheral neuropathy, presenting with new onset weakness involving lower extremities. Patient is usually ambulatory independently at home. He woke up this morning with inability to move his lower extremities. He said no problems with control of urination. There has been no loss of sensation in lower extremities. He said no symptoms involving his upper extremities. He's been experiencing lower back pain in the sacral region. MRI of his lumbar spine showed multilevel degenerative changes but no acute abnormality. Patient's strength has been improving throughout the day with noticeable return of ability to move his feet as well as ability to at least slightly lift his legs.   Patient was not administered TPA secondary to delay in arrival, stroke not recognized on arrival. He was admitted for further evaluation and treatment.  SUBJECTIVE No family is at the bedside, his wife arrived via scooter during rounds.  Overall he feels his condition is gradually improving. He still has some weakness and ataxia.  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  OBJECTIVE Most recent Vital Signs: Filed Vitals:   05/09/14 0925 05/09/14 2047 05/09/14 2140 05/10/14 0610  BP: 132/72 133/69 133/69   Pulse: 68 65 65   Temp:  98.4 F (36.9 C)    TempSrc:  Oral    Resp:  18    Height:      Weight:    87.816 kg (193 lb 9.6 oz)  SpO2:  94%     CBG (last 3)  No results found for this basename: GLUCAP,  in the last 72 hours  IV Fluid Intake:     MEDICATIONS  . carvedilol  3.125 mg Oral BID  . clopidogrel  75 mg Oral Daily  . heparin  5,000 Units Subcutaneous 3 times per day  . lisinopril  20 mg Oral Daily   PRN:  acetaminophen, acetaminophen, docusate sodium, ondansetron (ZOFRAN) IV, ondansetron  Diet:  Sodium Restricted thin liquids Activity:   Up with  assistance DVT Prophylaxis:  Heparin 5000 units sq tid   CLINICALLY SIGNIFICANT STUDIES Basic Metabolic Panel:   Recent Labs Lab 05/07/14 1531 05/08/14 0537  NA 141 143  K 4.9 4.5  CL 105 106  CO2 24 22  GLUCOSE 93 96  BUN 22 19  CREATININE 1.05 1.05  CALCIUM 9.2 9.2   Liver Function Tests:   Recent Labs Lab 05/08/14 0537  AST 16  ALT 11  ALKPHOS 52  BILITOT 0.5  PROT 6.8  ALBUMIN 3.7   CBC:   Recent Labs Lab 05/07/14 1531 05/08/14 0537  WBC 6.7 6.6  HGB 13.8 13.4  HCT 41.2 40.7  MCV 87.8 87.7  PLT 187 202   Coagulation:   Recent Labs Lab 05/08/14 0537  LABPROT 13.8  INR 1.06   Cardiac Enzymes: No results found for this basename: CKTOTAL, CKMB, CKMBINDEX, TROPONINI,  in the last 168 hours Urinalysis: No results found for this basename: COLORURINE, APPERANCEUR, LABSPEC, PHURINE, GLUCOSEU, HGBUR, BILIRUBINUR, KETONESUR, PROTEINUR, UROBILINOGEN, NITRITE, LEUKOCYTESUR,  in the last 168 hours Lipid Panel    Component Value Date/Time   CHOL 139 05/09/2014 0535   TRIG 131 05/09/2014 0535   HDL 28* 05/09/2014 0535   CHOLHDL 5.0 05/09/2014 0535   VLDL 26 05/09/2014 0535   LDLCALC 85 05/09/2014 0535   HgbA1C  Lab Results  Component Value Date   HGBA1C 5.9* 05/08/2014  Urine Drug Screen:   No results found for this basename: labopia,  cocainscrnur,  labbenz,  amphetmu,  thcu,  labbarb    Alcohol Level: No results found for this basename: ETH,  in the last 168 hours  Mr Thoracic Spine Wo Contrast  05/07/2014   No acute abnormality identified within the thoracic spine. No evidence of cord compression or other intrinsic spinal cord abnormality.     Mr Lumbar Spine Wo Contrast  05/07/2014    Unchanged appearance of the lumbar spine without acute abnormality identified. Moderate multilevel spondylosis resulting in mild spinal canal and lateral recess stenosis at L3-4 and mild right neural foraminal stenosis at L4-5.     MRI of the brain  05/08/2014   Clustered  areas of small cortical infarctions in the medial left parietal lobe at the vertex consistent with small infarctions in a left MCA branch vessel territory. No hemorrhage or swelling.  Chronic small vessel ischemic changes elsewhere throughout the brain.  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 This was not primarily or completely evaluated.    MRA of the brain  05/08/2014    No major vessel occlusion. Diffuse intracranial irregularity of the medium size vessels consistent with diffuse atherosclerosis. Severe stenosis of the distal left vertebral artery just proximal to the basilar.  Carotid Doppler  Right: 40-59%, upper end of scale, internal carotid artery stenosis. Left: 40-59% internal carotid artery stenosis. Bilateral: Vertebral artery flow is antegrade.   2D Echocardiogram  EF 60-65% with no source of embolus.   EKG  normal sinus rhythm. For complete results please see formal report.   Therapy Recommendations CIR vs SNF  Physical Exam   Pleasant elderly Caucasian male not in distress.Awake alert. Afebrile. Head is nontraumatic. Neck is supple without bruit. Hearing is normal. Cardiac exam no murmur or gallop. Lungs are clear to auscultation. Distal pulses are well felt. Neurological Exam : Awake alert oriented x3 with normal speech and language function. Fundi were not visualized. Vision acuity seems adequate. Extraocular movements are full range without nystagmus. Blinks to threat bilaterally. Face is symmetric without weakness. Tongue is midline. Good cough and gag. Motor system exam reveals no upper or lower extremity drift. Bilateral footdrop with ankle dorsiflexor and plantar evertor weakness which is old from neuropathy. Diminished sensation in both feet in a stocking distribution and impaired vibration of her toes bilaterally. Both plantars are downgoing. Gait was not tested. ASSESSMENT Mr. Manuel [name] is a 80 y.o. male presenting with with known peripheral  neuropathy and weakness distally in both lower extremities admitted to [facility] due to worsening R LE weakness that according to patient developed rather abruptly on 7/14. Not intially thought to have a stroke, however MRI imaging now confirms multiple left MCA branch infarcts. Infarcts felt to be embolic secondary to unknown etiolology. Patient reports symptoms c/w atrial fibrillation (flutter in chest, especially with activity, wakes him during the night), though he has no formal diagnosis.  On no antithrombotics prior to admission. Now on clopidogrel 75 mg orally every day for secondary stroke prevention. Patient with resultant ataxia. Stroke work up underway.  hypertension On coreg, lisinopril at home  Hx GI bleeding 2010 Clipped at that time, no bleeding since on aspirin until a couple of years ago Placed on plavix this admission   Neuropathy Etiology unclear Followed by Dr. Posey Pronto at [facility] as an OP  Pituitary adenoma incidental finding Given age, location, doubt surgery is indicated. Has  seen Dr. Vertell Limber in the past. Can followup as OP  Additional stroke risk factors:  CAD  Hospital day # 3  TREATMENT/PLAN  Continue clopidogrel 75 mg orally every day for secondary stroke prevention for now.   Recommend cardiology consult to assess for atrial fibrillation. Typically, we recommend TEE then loop placement, but given strong history c/w possible atrial fibrillation, we would like their opinion as to appropriate treatment course. He has seen Dr [name] in the past.   I have called rehab to reassess pt appropriateness for CIR given stroke diagnosis  Outpatient f/u with Dr Posey Pronto- patient`s neurologist  SIGNED Burnetta Sabin, MSN, RN, ANVP-BC, ANP-BC, GNP-BC [facility] Pager: [phone] 05/10/2014 10:08 AM   I have personally examined this patient, reviewed notes, independently viewed imaging studies, participated in medical decision making and plan of care. I have  made any additions or clarifications directly to the above note. Agree with note above. . Patient has mild ankle weakness bilaterally which is chronic from baseline peripheral neuropathy but otherwise has improved to. He needs evaluation by therapy for rehabilitation. He likely needs anticoagulation for suspected atrial fibrillation but will defer to cardiology to decide if loop recorder is needed  Antony Contras, [place] Pager: [phone] 05/10/2014 2:57 PM   To contact Stroke Continuity provider, please refer to [email or web address] After hours, contact General Neurology

## 2014-05-10 NOTE — Progress Notes (Signed)
Rehab admissions - I was contacted by Burnetta Sabin, neuro NP and informed that MRI findings did demonstrate L Parietal CVA.  Rehab MD consult has been ordered and we will await completion of consult.   Rehab admission coordinator to follow up then. Please call me with any questions. Thanks.  Nanetta Batty, PT Rehabilitation Admissions Coordinator [phone]

## 2014-05-11 NOTE — Progress Notes (Addendum)
Stroke Team Progress Note  HISTORY Manuel [name] is an 80 y.o. male history of hypertension, coronary artery disease, carotid occlusion and peripheral neuropathy, presenting with new onset weakness involving lower extremities. Patient is usually ambulatory independently at home. He woke up this morning with inability to move his lower extremities. He said no problems with control of urination. There has been no loss of sensation in lower extremities. He said no symptoms involving his upper extremities. He's been experiencing lower back pain in the sacral region. MRI of his lumbar spine showed multilevel degenerative changes but no acute abnormality. Patient's strength has been improving throughout the day with noticeable return of ability to move his feet as well as ability to at least slightly lift his legs.   Patient was not administered TPA secondary to delay in arrival, stroke not recognized on arrival. He was admitted for further evaluation and treatment.  SUBJECTIVE No family is at the bedside. Overall he feels his condition is gradually improving. He still has some weakness and ataxia.  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  OBJECTIVE Most recent Vital Signs: Filed Vitals:   05/10/14 1035 05/10/14 2044 05/11/14 0507 05/11/14 0607  BP: 164/66 139/52  132/67  Pulse:  55  58  Temp:  98 F (36.7 C)  98.1 F (36.7 C)  TempSrc:  Oral  Oral  Resp:  17  16  Height:      Weight:   190 lb 14.7 oz (86.6 kg)   SpO2:  95%  98%   CBG (last 3)  No results found for this basename: GLUCAP,  in the last 72 hours  IV Fluid Intake:     MEDICATIONS  . carvedilol  3.125 mg Oral BID  . clopidogrel  75 mg Oral Daily  . heparin  5,000 Units Subcutaneous 3 times per day  . lisinopril  20 mg Oral Daily   PRN:  acetaminophen, acetaminophen, docusate sodium, ondansetron (ZOFRAN) IV, ondansetron  Diet:  Sodium Restricted thin liquids Activity:   Up with assistance DVT  Prophylaxis:  Heparin 5000 units sq tid   CLINICALLY SIGNIFICANT STUDIES Basic Metabolic Panel:   Recent Labs Lab 05/07/14 1531 05/08/14 0537  NA 141 143  K 4.9 4.5  CL 105 106  CO2 24 22  GLUCOSE 93 96  BUN 22 19  CREATININE 1.05 1.05  CALCIUM 9.2 9.2   Liver Function Tests:   Recent Labs Lab 05/08/14 0537  AST 16  ALT 11  ALKPHOS 52  BILITOT 0.5  PROT 6.8  ALBUMIN 3.7   CBC:   Recent Labs Lab 05/07/14 1531 05/08/14 0537  WBC 6.7 6.6  HGB 13.8 13.4  HCT 41.2 40.7  MCV 87.8 87.7  PLT 187 202   Coagulation:   Recent Labs Lab 05/08/14 0537  LABPROT 13.8  INR 1.06   Cardiac Enzymes: No results found for this basename: CKTOTAL, CKMB, CKMBINDEX, TROPONINI,  in the last 168 hours Urinalysis: No results found for this basename: COLORURINE, APPERANCEUR, LABSPEC, PHURINE, GLUCOSEU, HGBUR, BILIRUBINUR, KETONESUR, PROTEINUR, UROBILINOGEN, NITRITE, LEUKOCYTESUR,  in the last 168 hours Lipid Panel    Component Value Date/Time   CHOL 139 05/09/2014 0535   TRIG 131 05/09/2014 0535   HDL 28* 05/09/2014 0535   CHOLHDL 5.0 05/09/2014 0535   VLDL 26 05/09/2014 0535   LDLCALC 85 05/09/2014 0535   HgbA1C  Lab Results  Component Value Date   HGBA1C 5.9* 05/08/2014    Urine Drug  Screen:   No results found for this basename: labopia,  cocainscrnur,  labbenz,  amphetmu,  thcu,  labbarb    Alcohol Level: No results found for this basename: ETH,  in the last 168 hours  Mr Thoracic Spine Wo Contrast  05/07/2014   No acute abnormality identified within the thoracic spine. No evidence of cord compression or other intrinsic spinal cord abnormality.     Mr Lumbar Spine Wo Contrast  05/07/2014    Unchanged appearance of the lumbar spine without acute abnormality identified. Moderate multilevel spondylosis resulting in mild spinal canal and lateral recess stenosis at L3-4 and mild right neural foraminal stenosis at L4-5.     MRI of the brain  05/08/2014   Clustered areas of small  cortical infarctions in the medial left parietal lobe at the vertex consistent with small infarctions in a left MCA branch vessel territory. No hemorrhage or swelling.  Chronic small vessel ischemic changes elsewhere throughout the brain.  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 This was not primarily or completely evaluated.    MRA of the brain  05/08/2014    No major vessel occlusion. Diffuse intracranial irregularity of the medium size vessels consistent with diffuse atherosclerosis. Severe stenosis of the distal left vertebral artery just proximal to the basilar.  Carotid Doppler  Right: 40-59%, upper end of scale, internal carotid artery stenosis. Left: 40-59% internal carotid artery stenosis. Bilateral: Vertebral artery flow is antegrade.   2D Echocardiogram  EF 60-65% with no source of embolus.   EKG  normal sinus rhythm. For complete results please see formal report.   Therapy Recommendations CIR vs SNF  Physical Exam   Pleasant elderly Caucasian male not in distress.Awake alert. Afebrile. Head is nontraumatic. Neck is supple without bruit. Hearing is normal. Cardiac exam no murmur or gallop. Lungs are clear to auscultation. Distal pulses are well felt. Neurological Exam : Awake alert oriented x3 with normal speech and language function. Fundi were not visualized. Vision acuity seems adequate. Extraocular movements are full range without nystagmus. Blinks to threat bilaterally. Face is symmetric without weakness. Tongue is midline. Good cough and gag. Motor system exam reveals no upper or lower extremity drift. Bilateral footdrop with ankle dorsiflexor and plantar evertor weakness which is old from neuropathy. Diminished sensation in both feet in a stocking distribution and impaired vibration of her toes bilaterally. Both plantars are downgoing. Gait was not tested. ASSESSMENT Mr. AVEER BARTOW is a 80 y.o. male presenting with with known peripheral neuropathy and  weakness distally in both lower extremities admitted to [facility] due to worsening R LE weakness that according to patient developed rather abruptly on 7/14. Not intially thought to have a stroke, however MRI imaging now confirms multiple left MCA branch infarcts. Infarcts felt to be embolic secondary to unknown etiolology. Patient reports symptoms c/w atrial fibrillation (flutter in chest, especially with activity, wakes him during the night), though he has no formal diagnosis.  On no antithrombotics prior to admission. Now on clopidogrel 75 mg orally every day for secondary stroke prevention. Patient with resultant ataxia. Stroke work up underway.  hypertension On coreg, lisinopril at home  Hx GI bleeding 2010 Clipped at that time, no bleeding since on aspirin until a couple of years ago Placed on plavix this admission   Neuropathy Etiology unclear Followed by Dr. Posey Pronto at [facility] as an OP  Pituitary adenoma incidental finding Given age, location, doubt surgery is indicated. Has seen Dr.  Vertell Limber in the past. Can followup as OP  Additional stroke risk factors:  CAD  Hospital day # 4  TREATMENT/PLAN  Continue clopidogrel 75 mg orally every day for secondary stroke prevention for now.   Evaluated by cardiology, plan for TEE and loop on Monday  Consider low dose statin per cardiology recs  Bilateral 40-59% carotid stenosis per carotid doppler  Rehab following  Follow up with Dr Vertell Limber as outpatient for likely incidental finding of pituitary adenoma  Outpatient f/u with Dr Posey Pronto- patient`s neurologist   Jim Like, DO [facility] Pager: [phone]    To contact Stroke Continuity provider, please refer to [email or web address] After hours, contact General Neurology

## 2014-05-11 NOTE — Progress Notes (Signed)
[facility] PROGRESS NOTE  Manuel [name] [id number] DOB: Sep 04, 1922 DOA: 05/07/2014 PCP: Shirline Frees, MD  Assessment/Plan: 1. Acute CVA-L pareital lobe -has long standing h/o Peripheral Neuropathy and gait disorder -MRI T spine normal -Carotid Doppler Right: 40-59%,ICA Left: 40-59% ICA stenosis. Bilateral: Vertebral artery flow is antegrade.  -2D Echocardiogram EF 60-65% with no source of embolus.  -LDL: 85, hbaic 5.9 -Neuro following, 7/17 Dr.Sethi  recommended Cards consult to eval for Anticoagulation given suspected AFib vs TEE/Loop, Seen by Cards plan for TEE/Loop Monday -Pt/OT eval completed, SNF vs CIR Monday -now on Plavix  2. HTN -stable, continue ACE  3. H/o Chronic constipation -continue colace  4. CAD -stable, continue coreg  5. Pituitary adenoma  -incidental finding on MRI -Per Neuro :Given age, location, doubt surgery is indicated.  Has seen Dr. Vertell Limber in the past. Can followup as OP  DVT proph: hep SQ  Code Status: Full Code Family Communication:no family at bedside Disposition Plan: SNF vs CIR Monday   Consultants:  NEuro  HPI/Subjective: No complaints, chronic neuropathy symptoms  Objective: Filed Vitals:   05/11/14 0607  BP: 132/67  Pulse: 58  Temp: 98.1 F (36.7 C)  Resp: 16    Intake/Output Summary (Last 24 hours) at 05/11/14 1001 Last data filed at 05/11/14 0700  Gross per 24 hour  Intake    360 ml  Output      0 ml  Net    360 ml   Filed Weights   05/09/14 0333 05/10/14 0610 05/11/14 0507  Weight: 87.5 kg (192 lb 14.4 oz) 87.816 kg (193 lb 9.6 oz) 86.6 kg (190 lb 14.7 oz)    Exam:   General:  AAOx3, comfortable appearing  Cardiovascular: S1S2/RRR  Respiratory: CTAB  Abdomen: soft, Nt, BS present  Musculoskeletal: no edema c/c  NEuro: periph neuropathy, diminished DTR and mute plantars, rest non focal   Data Reviewed: Basic Metabolic Panel:  Recent Labs Lab 05/07/14 1531 05/08/14 0537  NA 141  143  K 4.9 4.5  CL 105 106  CO2 24 22  GLUCOSE 93 96  BUN 22 19  CREATININE 1.05 1.05  CALCIUM 9.2 9.2   Liver Function Tests:  Recent Labs Lab 05/08/14 0537  AST 16  ALT 11  ALKPHOS 52  BILITOT 0.5  PROT 6.8  ALBUMIN 3.7   No results found for this basename: LIPASE, AMYLASE,  in the last 168 hours No results found for this basename: AMMONIA,  in the last 168 hours CBC:  Recent Labs Lab 05/07/14 1531 05/08/14 0537  WBC 6.7 6.6  HGB 13.8 13.4  HCT 41.2 40.7  MCV 87.8 87.7  PLT 187 202   Cardiac Enzymes: No results found for this basename: CKTOTAL, CKMB, CKMBINDEX, TROPONINI,  in the last 168 hours BNP (last 3 results) No results found for this basename: PROBNP,  in the last 8760 hours CBG: No results found for this basename: GLUCAP,  in the last 168 hours  No results found for this or any previous visit (from the past 240 hour(s)).   Studies: No results found.  Scheduled Meds: . carvedilol  3.125 mg Oral BID  . clopidogrel  75 mg Oral Daily  . heparin  5,000 Units Subcutaneous 3 times per day  . lisinopril  20 mg Oral Daily   Continuous Infusions:  Antibiotics Given (last 72 hours)   None      Principal Problem:   Bilateral leg weakness Active Problems:   PERIPHERAL NEUROPATHY   HYPERTENSION  CAD   Peripheral vascular disease, unspecified   Lower extremity weakness   CVA (cerebral infarction)   Acute CVA (cerebrovascular accident)    Time spent: 26min    Loren Sawaya  [facility] Pager [phone]. If 7PM-7AM, please contact night-coverage at [email or web address], password [facility] 05/11/2014, 10:01 AM  LOS: 4 days

## 2014-05-11 NOTE — Plan of Care (Signed)
Problem: Progression Outcomes Goal: Pain controlled Outcome: Completed/Met Date Met:  05/11/14 Patient denies pain and states occasionally he does have a headache but it is well controlled with prn Tylenol.

## 2014-05-12 DIAGNOSIS — I779 Disorder of arteries and arterioles, unspecified: Secondary | ICD-10-CM

## 2014-05-12 NOTE — Progress Notes (Signed)
[facility] PROGRESS NOTE  HERNY SCURLOCK QQV:[id number] DOB: 1922-02-24 DOA: 05/07/2014 PCP: Shirline Frees, MD  Assessment/Plan: 1. Acute CVA-L pareital lobe -has long standing h/o Peripheral Neuropathy and gait disorder -slowly improving -Carotid Doppler Right: 40-59%,ICA Left: 40-59% ICA stenosis. Bilateral: Vertebral artery flow is antegrade.  -2D Echocardiogram EF 60-65% with no source of embolus.  -LDL: 85, hbaic 5.9 -Neuro following, 7/17 Dr.Sethi  recommended Cards consult to eval for Anticoagulation given suspected AFib vs TEE/Loop, Seen by Cards plan for TEE/Loop Monday -Pt/OT eval completed, SNF vs CIR Monday -now on Plavix, stable  2. HTN -stable, continue ACE  3. H/o Chronic constipation -continue colace  4. CAD -stable, continue coreg  5. Pituitary adenoma  -incidental finding on MRI -Per Neuro :Given age, location, doubt surgery is indicated.  Has seen Dr. Vertell Limber in the past. Can followup as OP  DVT proph: hep SQ  Code Status: Full Code Family Communication:no family at bedside Disposition Plan: SNF vs CIR Monday   Consultants:  NEuro  HPI/Subjective: No complaints, chronic neuropathy symptoms, but overall improving, wants to walk  Objective: Filed Vitals:   05/12/14 0657  BP: 134/60  Pulse: 56  Temp: 97.5 F (36.4 C)  Resp: 18    Intake/Output Summary (Last 24 hours) at 05/12/14 0814 Last data filed at 05/11/14 1800  Gross per 24 hour  Intake    240 ml  Output      0 ml  Net    240 ml   Filed Weights   05/10/14 0610 05/11/14 0507 05/12/14 0522  Weight: 87.816 kg (193 lb 9.6 oz) 86.6 kg (190 lb 14.7 oz) 87.4 kg (192 lb 10.9 oz)    Exam:   General:  AAOx3, comfortable appearing  Cardiovascular: S1S2/RRR  Respiratory: CTAB  Abdomen: soft, Nt, BS present  Musculoskeletal: no edema c/c  NEuro: periph neuropathy, diminished DTR and mute plantars, rest non focal, slightly improved strength RLE  Data Reviewed: Basic  Metabolic Panel:  Recent Labs Lab 05/07/14 1531 05/08/14 0537  NA 141 143  K 4.9 4.5  CL 105 106  CO2 24 22  GLUCOSE 93 96  BUN 22 19  CREATININE 1.05 1.05  CALCIUM 9.2 9.2   Liver Function Tests:  Recent Labs Lab 05/08/14 0537  AST 16  ALT 11  ALKPHOS 52  BILITOT 0.5  PROT 6.8  ALBUMIN 3.7   No results found for this basename: LIPASE, AMYLASE,  in the last 168 hours No results found for this basename: AMMONIA,  in the last 168 hours CBC:  Recent Labs Lab 05/07/14 1531 05/08/14 0537  WBC 6.7 6.6  HGB 13.8 13.4  HCT 41.2 40.7  MCV 87.8 87.7  PLT 187 202   Cardiac Enzymes: No results found for this basename: CKTOTAL, CKMB, CKMBINDEX, TROPONINI,  in the last 168 hours BNP (last 3 results) No results found for this basename: PROBNP,  in the last 8760 hours CBG: No results found for this basename: GLUCAP,  in the last 168 hours  No results found for this or any previous visit (from the past 240 hour(s)).   Studies: No results found.  Scheduled Meds: . carvedilol  3.125 mg Oral BID  . clopidogrel  75 mg Oral Daily  . heparin  5,000 Units Subcutaneous 3 times per day  . lisinopril  20 mg Oral Daily   Continuous Infusions:  Antibiotics Given (last 72 hours)   None      Principal Problem:   Bilateral leg weakness Active  Problems:   PERIPHERAL NEUROPATHY   HYPERTENSION   CAD   Peripheral vascular disease, unspecified   Lower extremity weakness   CVA (cerebral infarction)   Acute CVA (cerebrovascular accident)    Time spent: 85min    Nicholle Falzon  [facility] Pager [phone]. If 7PM-7AM, please contact night-coverage at [email or web address], password [facility] 05/12/2014, 8:14 AM  LOS: 5 days

## 2014-05-12 NOTE — Progress Notes (Addendum)
Stroke Team Progress Note  HISTORY Manuel [name] is an 80 y.o. male history of hypertension, coronary artery disease, carotid occlusion and peripheral neuropathy, presenting with new onset weakness involving lower extremities. Patient is usually ambulatory independently at home. He woke up this morning with inability to move his lower extremities. He said no problems with control of urination. There has been no loss of sensation in lower extremities. He said no symptoms involving his upper extremities. He's been experiencing lower back pain in the sacral region. MRI of his lumbar spine showed multilevel degenerative changes but no acute abnormality. Patient's strength has been improving throughout the day with noticeable return of ability to move his feet as well as ability to at least slightly lift his legs.   Patient was not administered TPA secondary to delay in arrival, stroke not recognized on arrival. He was admitted for further evaluation and treatment.  SUBJECTIVE No family is at the bedside. Overall stable  OBJECTIVE Most recent Vital Signs: Filed Vitals:   05/11/14 2140 05/11/14 2200 05/12/14 0522 05/12/14 0657  BP: 169/65 150/60  134/60  Pulse: 59 51  56  Temp: 97.4 F (36.3 C)   97.5 F (36.4 C)  TempSrc: Oral   Oral  Resp: 18   18  Height:      Weight:   192 lb 10.9 oz (87.4 kg)   SpO2: 99%   95%   CBG (last 3)  No results found for this basename: GLUCAP,  in the last 72 hours  IV Fluid Intake:     MEDICATIONS  . carvedilol  3.125 mg Oral BID  . clopidogrel  75 mg Oral Daily  . heparin  5,000 Units Subcutaneous 3 times per day  . lisinopril  20 mg Oral Daily   PRN:  acetaminophen, acetaminophen, docusate sodium, ondansetron (ZOFRAN) IV, ondansetron  Diet:  Sodium Restricted thin liquids Activity:   Up with assistance DVT Prophylaxis:  Heparin 5000 units sq tid   CLINICALLY SIGNIFICANT STUDIES Basic Metabolic Panel:   Recent Labs Lab 05/07/14 1531  05/08/14 0537  NA 141 143  K 4.9 4.5  CL 105 106  CO2 24 22  GLUCOSE 93 96  BUN 22 19  CREATININE 1.05 1.05  CALCIUM 9.2 9.2   Liver Function Tests:   Recent Labs Lab 05/08/14 0537  AST 16  ALT 11  ALKPHOS 52  BILITOT 0.5  PROT 6.8  ALBUMIN 3.7   CBC:   Recent Labs Lab 05/07/14 1531 05/08/14 0537  WBC 6.7 6.6  HGB 13.8 13.4  HCT 41.2 40.7  MCV 87.8 87.7  PLT 187 202   Coagulation:   Recent Labs Lab 05/08/14 0537  LABPROT 13.8  INR 1.06   Cardiac Enzymes: No results found for this basename: CKTOTAL, CKMB, CKMBINDEX, TROPONINI,  in the last 168 hours Urinalysis: No results found for this basename: COLORURINE, APPERANCEUR, LABSPEC, PHURINE, GLUCOSEU, HGBUR, BILIRUBINUR, KETONESUR, PROTEINUR, UROBILINOGEN, NITRITE, LEUKOCYTESUR,  in the last 168 hours Lipid Panel    Component Value Date/Time   CHOL 139 05/09/2014 0535   TRIG 131 05/09/2014 0535   HDL 28* 05/09/2014 0535   CHOLHDL 5.0 05/09/2014 0535   VLDL 26 05/09/2014 0535   LDLCALC 85 05/09/2014 0535   HgbA1C  Lab Results  Component Value Date   HGBA1C 5.9* 05/08/2014    Urine Drug Screen:   No results found for this basename: labopia,  cocainscrnur,  labbenz,  amphetmu,  thcu,  labbarb    Alcohol Level: No  results found for this basename: ETH,  in the last 168 hours  Mr Thoracic Spine Wo Contrast  05/07/2014   No acute abnormality identified within the thoracic spine. No evidence of cord compression or other intrinsic spinal cord abnormality.     Mr Lumbar Spine Wo Contrast  05/07/2014    Unchanged appearance of the lumbar spine without acute abnormality identified. Moderate multilevel spondylosis resulting in mild spinal canal and lateral recess stenosis at L3-4 and mild right neural foraminal stenosis at L4-5.     MRI of the brain  05/08/2014   Clustered areas of small cortical infarctions in the medial left parietal lobe at the vertex consistent with small infarctions in a left MCA branch vessel  territory. No hemorrhage or swelling.  Chronic small vessel ischemic changes elsewhere throughout the brain.  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 This was not primarily or completely evaluated.    MRA of the brain  05/08/2014    No major vessel occlusion. Diffuse intracranial irregularity of the medium size vessels consistent with diffuse atherosclerosis. Severe stenosis of the distal left vertebral artery just proximal to the basilar.  Carotid Doppler  Right: 40-59%, upper end of scale, internal carotid artery stenosis. Left: 40-59% internal carotid artery stenosis. Bilateral: Vertebral artery flow is antegrade.   2D Echocardiogram  EF 60-65% with no source of embolus.   EKG  normal sinus rhythm. For complete results please see formal report.   Therapy Recommendations CIR vs SNF  Physical Exam   Pleasant elderly Caucasian male not in distress.Awake alert. Afebrile. Head is nontraumatic. Neck is supple without bruit. Hearing is normal. Cardiac exam no murmur or gallop. Lungs are clear to auscultation. Distal pulses are well felt. Neurological Exam : Awake alert oriented x3 with normal speech and language function. Fundi were not visualized. Vision acuity seems adequate. Extraocular movements are full range without nystagmus. Blinks to threat bilaterally. Face is symmetric without weakness. Motor system exam reveals no upper or lower extremity drift. Bilateral footdrop with ankle dorsiflexor and plantar evertor weakness which is old from neuropathy. Diminished sensation in both feet in a stocking distribution and impaired vibration of her toes bilaterally. Both plantars are downgoing. Gait was not tested. ASSESSMENT [name] is a 80 y.o. male presenting with with known peripheral neuropathy and weakness distally in both lower extremities admitted to [facility] due to worsening R LE weakness that according to patient developed rather abruptly on 7/14. Not intially  thought to have a stroke, however MRI imaging now confirms multiple left MCA branch infarcts. Infarcts felt to be embolic secondary to unknown etiolology. Patient reports symptoms c/w atrial fibrillation (flutter in chest, especially with activity, wakes him during the night), though he has no formal diagnosis.  On no antithrombotics prior to admission. Now on clopidogrel 75 mg orally every day for secondary stroke prevention. Patient with resultant ataxia. Stroke work up underway.  hypertension On coreg, lisinopril at home  Hx GI bleeding 2010 Clipped at that time, no bleeding since on aspirin until a couple of years ago Placed on plavix this admission   Neuropathy Etiology unclear Followed by Dr. Posey Pronto at [facility] as an OP  Pituitary adenoma incidental finding Given age, location, doubt surgery is indicated. Has seen Dr. Vertell Limber in the past. Can followup as OP  Additional stroke risk factors:  CAD  Hospital day # 5  TREATMENT/PLAN  Continue clopidogrel 75 mg orally every day for secondary stroke  prevention for now.   Evaluated by cardiology, plan for TEE and loop on Monday.   CIR vs SNF discharge, likely after TEE on Monday  Consider low dose statin per cardiology recs  Bilateral 40-59% carotid stenosis per carotid doppler  Rehab following  Follow up with Dr Vertell Limber as outpatient for likely incidental finding of pituitary adenoma  Outpatient f/u with Dr Posey Pronto- patient`s neurologist   Jim Like, DO [facility] Pager: [phone]    To contact Stroke Continuity provider, please refer to [email or web address] After hours, contact General Neurology

## 2014-05-13 ENCOUNTER — Encounter (HOSPITAL_COMMUNITY)
Admission: EM | Disposition: A | Discharge status: Skilled Nursing Facility | Payer: Self-pay | Source: Home / Self Care | Attending: Internal Medicine

## 2014-05-13 ENCOUNTER — Encounter (HOSPITAL_COMMUNITY): Payer: Self-pay | Admitting: *Deleted

## 2014-05-13 DIAGNOSIS — I6350 Cerebral infarction due to unspecified occlusion or stenosis of unspecified cerebral artery: Secondary | ICD-10-CM

## 2014-05-13 HISTORY — PX: LOOP RECORDER IMPLANT: SHX5477

## 2014-05-13 HISTORY — PX: LOOP RECORDER IMPLANT: SHX5954

## 2014-05-13 SURGERY — CANCELLED PROCEDURE

## 2014-05-13 SURGERY — LOOP RECORDER IMPLANT
Anesthesia: LOCAL

## 2014-05-13 MED ORDER — FENTANYL CITRATE 0.05 MG/ML IJ SOLN
INTRAMUSCULAR | Status: AC
  Filled 2014-05-13: qty 2

## 2014-05-13 MED ORDER — CLOPIDOGREL BISULFATE 75 MG PO TABS: 75.0000 mg | ORAL_TABLET | Freq: Every day | ORAL | Status: DC

## 2014-05-13 MED ORDER — MIDAZOLAM HCL 5 MG/ML IJ SOLN
INTRAMUSCULAR | Status: AC
  Filled 2014-05-13: qty 2

## 2014-05-13 MED ORDER — SODIUM CHLORIDE 0.9 % IV SOLN
INTRAVENOUS | Status: DC
  Administered 2014-05-13: 500 mL via INTRAVENOUS

## 2014-05-13 MED ORDER — LIDOCAINE-EPINEPHRINE 1 %-1:100000 IJ SOLN
INTRAMUSCULAR | Status: AC
  Filled 2014-05-13: qty 1

## 2014-05-13 MED ORDER — DIPHENHYDRAMINE HCL 50 MG/ML IJ SOLN
INTRAMUSCULAR | Status: AC
  Filled 2014-05-13: qty 1

## 2014-05-13 NOTE — CV Procedure (Addendum)
Pre op Dx cryptogenic stroke Post op Dx  same  Procedure  Loop Recorder implantation  After routine prep and drape of the left parasternal area, a small incision was created. A [employer] LINQ Reveal Loop Recorder  Serial Number  [id number] Swas inserted.    SteriStrip dressing was  applied.  The patient tolerated the procedure without apparent complication.

## 2014-05-13 NOTE — Progress Notes (Signed)
Rehab admissions - I met briefly with patient's wife.  Patient was down in procedure.  Wife would like inpatient rehab admission.  I have called [place] and I have opened the case requesting inpatient rehab.  I will follow up after I hear back from insurance carrier.  Call me for questions.  #[phone]

## 2014-05-13 NOTE — Progress Notes (Addendum)
Patient has bed at [facility]; however, CIR is currently working patient up for possible admission if approved by [facility]- Medicare Complete. Family has signed admission paperwork for [facility] and facility is on standby if unable to go to CIR.  Discussed with Karene Fry, RN- CIR who indicated that CIR would be patient/wife's first preference.  CSW will continue to monitor; discussed above with patient's nurse.  Lorie Phenix. Pauline Good, [place]  .

## 2014-05-13 NOTE — H&P (View-Only) (Signed)
Stroke Team Progress Note  HISTORY Manuel [name] is an 80 y.o. male history of hypertension, coronary artery disease, carotid occlusion and peripheral neuropathy, presenting with new onset weakness involving lower extremities. Patient is usually ambulatory independently at home. He woke up this morning with inability to move his lower extremities. He said no problems with control of urination. There has been no loss of sensation in lower extremities. He said no symptoms involving his upper extremities. He's been experiencing lower back pain in the sacral region. MRI of his lumbar spine showed multilevel degenerative changes but no acute abnormality. Patient's strength has been improving throughout the day with noticeable return of ability to move his feet as well as ability to at least slightly lift his legs.   Patient was not administered TPA secondary to delay in arrival, stroke not recognized on arrival. He was admitted for further evaluation and treatment.  SUBJECTIVE No family is at the bedside. Overall stable  OBJECTIVE Most recent Vital Signs: Filed Vitals:   05/11/14 2140 05/11/14 2200 05/12/14 0522 05/12/14 0657  BP: 169/65 150/60  134/60  Pulse: 59 51  56  Temp: 97.4 F (36.3 C)   97.5 F (36.4 C)  TempSrc: Oral   Oral  Resp: 18   18  Height:      Weight:   192 lb 10.9 oz (87.4 kg)   SpO2: 99%   95%   CBG (last 3)  No results found for this basename: GLUCAP,  in the last 72 hours  IV Fluid Intake:     MEDICATIONS  . carvedilol  3.125 mg Oral BID  . clopidogrel  75 mg Oral Daily  . heparin  5,000 Units Subcutaneous 3 times per day  . lisinopril  20 mg Oral Daily   PRN:  acetaminophen, acetaminophen, docusate sodium, ondansetron (ZOFRAN) IV, ondansetron  Diet:  Sodium Restricted thin liquids Activity:   Up with assistance DVT Prophylaxis:  Heparin 5000 units sq tid   CLINICALLY SIGNIFICANT STUDIES Basic Metabolic Panel:   Recent Labs Lab 05/07/14 1531  05/08/14 0537  NA 141 143  K 4.9 4.5  CL 105 106  CO2 24 22  GLUCOSE 93 96  BUN 22 19  CREATININE 1.05 1.05  CALCIUM 9.2 9.2   Liver Function Tests:   Recent Labs Lab 05/08/14 0537  AST 16  ALT 11  ALKPHOS 52  BILITOT 0.5  PROT 6.8  ALBUMIN 3.7   CBC:   Recent Labs Lab 05/07/14 1531 05/08/14 0537  WBC 6.7 6.6  HGB 13.8 13.4  HCT 41.2 40.7  MCV 87.8 87.7  PLT 187 202   Coagulation:   Recent Labs Lab 05/08/14 0537  LABPROT 13.8  INR 1.06   Cardiac Enzymes: No results found for this basename: CKTOTAL, CKMB, CKMBINDEX, TROPONINI,  in the last 168 hours Urinalysis: No results found for this basename: COLORURINE, APPERANCEUR, LABSPEC, PHURINE, GLUCOSEU, HGBUR, BILIRUBINUR, KETONESUR, PROTEINUR, UROBILINOGEN, NITRITE, LEUKOCYTESUR,  in the last 168 hours Lipid Panel    Component Value Date/Time   CHOL 139 05/09/2014 0535   TRIG 131 05/09/2014 0535   HDL 28* 05/09/2014 0535   CHOLHDL 5.0 05/09/2014 0535   VLDL 26 05/09/2014 0535   LDLCALC 85 05/09/2014 0535   HgbA1C  Lab Results  Component Value Date   HGBA1C 5.9* 05/08/2014    Urine Drug Screen:   No results found for this basename: labopia,  cocainscrnur,  labbenz,  amphetmu,  thcu,  labbarb    Alcohol Level: No  results found for this basename: ETH,  in the last 168 hours  Mr Thoracic Spine Wo Contrast  05/07/2014   No acute abnormality identified within the thoracic spine. No evidence of cord compression or other intrinsic spinal cord abnormality.     Mr Lumbar Spine Wo Contrast  05/07/2014    Unchanged appearance of the lumbar spine without acute abnormality identified. Moderate multilevel spondylosis resulting in mild spinal canal and lateral recess stenosis at L3-4 and mild right neural foraminal stenosis at L4-5.     MRI of the brain  05/08/2014   Clustered areas of small cortical infarctions in the medial left parietal lobe at the vertex consistent with small infarctions in a left MCA branch vessel  territory. No hemorrhage or swelling.  Chronic small vessel ischemic changes elsewhere throughout the brain.  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 This was not primarily or completely evaluated.    MRA of the brain  05/08/2014    No major vessel occlusion. Diffuse intracranial irregularity of the medium size vessels consistent with diffuse atherosclerosis. Severe stenosis of the distal left vertebral artery just proximal to the basilar.  Carotid Doppler  Right: 40-59%, upper end of scale, internal carotid artery stenosis. Left: 40-59% internal carotid artery stenosis. Bilateral: Vertebral artery flow is antegrade.   2D Echocardiogram  EF 60-65% with no source of embolus.   EKG  normal sinus rhythm. For complete results please see formal report.   Therapy Recommendations CIR vs SNF  Physical Exam   Pleasant elderly Caucasian male not in distress.Awake alert. Afebrile. Head is nontraumatic. Neck is supple without bruit. Hearing is normal. Cardiac exam no murmur or gallop. Lungs are clear to auscultation. Distal pulses are well felt. Neurological Exam : Awake alert oriented x3 with normal speech and language function. Fundi were not visualized. Vision acuity seems adequate. Extraocular movements are full range without nystagmus. Blinks to threat bilaterally. Face is symmetric without weakness. Motor system exam reveals no upper or lower extremity drift. Bilateral footdrop with ankle dorsiflexor and plantar evertor weakness which is old from neuropathy. Diminished sensation in both feet in a stocking distribution and impaired vibration of her toes bilaterally. Both plantars are downgoing. Gait was not tested. ASSESSMENT Mr. Manuel [name] is a 80 y.o. male presenting with with known peripheral neuropathy and weakness distally in both lower extremities admitted to [facility] due to worsening R LE weakness that according to patient developed rather abruptly on 7/14. Not intially  thought to have a stroke, however MRI imaging now confirms multiple left MCA branch infarcts. Infarcts felt to be embolic secondary to unknown etiolology. Patient reports symptoms c/w atrial fibrillation (flutter in chest, especially with activity, wakes him during the night), though he has no formal diagnosis.  On no antithrombotics prior to admission. Now on clopidogrel 75 mg orally every day for secondary stroke prevention. Patient with resultant ataxia. Stroke work up underway.  hypertension On coreg, lisinopril at home  Hx GI bleeding 2010 Clipped at that time, no bleeding since on aspirin until a couple of years ago Placed on plavix this admission   Neuropathy Etiology unclear Followed by Dr. Posey Pronto at [facility] as an OP  Pituitary adenoma incidental finding Given age, location, doubt surgery is indicated. Has seen Dr. Vertell Limber in the past. Can followup as OP  Additional stroke risk factors:  CAD  Hospital day # 5  TREATMENT/PLAN  Continue clopidogrel 75 mg orally every day for secondary stroke  prevention for now.   Evaluated by cardiology, plan for TEE and loop on Monday.   CIR vs SNF discharge, likely after TEE on Monday  Consider low dose statin per cardiology recs  Bilateral 40-59% carotid stenosis per carotid doppler  Rehab following  Follow up with Dr Vertell Limber as outpatient for likely incidental finding of pituitary adenoma  Outpatient f/u with Dr Posey Pronto- patient`s neurologist   Jim Like, DO [facility] Pager: [phone]    To contact Stroke Continuity provider, please refer to [email or web address] After hours, contact General Neurology

## 2014-05-13 NOTE — Interval H&P Note (Signed)
History and Physical Interval Note:  05/13/2014 8:26 AM  Manuel [name]  has presented today for surgery, with the diagnosis of STROKE  The various methods of treatment have been discussed with the patient and family. After consideration of risks, benefits and other options for treatment, the patient has consented to  Procedure(s): TRANSESOPHAGEAL ECHOCARDIOGRAM (TEE) (N/A) as a surgical intervention .  The patient's history has been reviewed, patient examined, no change in status, stable for surgery.  I have reviewed the patient's chart and labs.  Questions were answered to the patient's satisfaction.     Sahmir Weatherbee

## 2014-05-13 NOTE — Progress Notes (Signed)
Occupational Therapy Treatment Patient Details Name: Manuel [name] MRN: [id number] DOB: 28-Aug-1922 Today's Date: 05/13/2014    History of present illness 80 yo male with bil LE weakness progressivel Past medical history of hypertension, irregular heartbeat, coronary artery disease, peripheral neuropathy.y worsening over week. MRI now + for lt parietal CVA.   OT comments  Patient seen this pm for skilled OT intervention to address balance, activity tolerance, and improve functional mobility as needed for basic self care skills.  Patient with bilateral foot drop, exaggerating especially left foot to lift and place with each step (history of peripheral neuropathy).  Patient indicates he has never worn AFO's.  Wife and granddaughter present at end of session, and inquiring about transfer.    Follow Up Recommendations  CIR;SNF    Equipment Recommendations       Recommendations for Other Services      Precautions / Restrictions Precautions Precautions: Fall Precaution Comments: bilateral foot drop Restrictions Weight Bearing Restrictions: No       Mobility Bed Mobility Overal bed mobility: Needs Assistance   Rolling: Min assist Sidelying to sit: Min assist          Transfers Overall transfer level: Needs assistance Equipment used: Rolling walker (2 wheeled) Transfers: Sit to/from [facility] Sit to Stand: Min assist Stand pivot transfers: Min assist            Balance Overall balance assessment: Needs assistance Sitting-balance support: No upper extremity supported;Feet supported Sitting balance-Leahy Scale: Fair     Standing balance support: Bilateral upper extremity supported;During functional activity Standing balance-Leahy Scale: Poor                     ADL Overall ADL's : Needs assistance/impaired                     Lower Body Dressing: Minimal assistance   Toilet Transfer: Min guard;RW;Regular Toilet;Grab bars              General ADL Comments: sit to stand with supervision, but then significant posterior sway.  Braces legs against bed behind for balance, or props hip on sink for balance      Vision                     Perception     Praxis      Cognition   Behavior During Therapy: [facility] for tasks assessed/performed Overall Cognitive Status: Within Functional Limits for tasks assessed                       Extremity/Trunk Assessment               Exercises     Shoulder Instructions       General Comments      Pertinent Vitals/ Pain       No report of pain  Home Living                                          Prior Functioning/Environment              Frequency Min 2X/week     Progress Toward Goals  OT Goals(current goals can now be found in the care plan section)  Progress towards OT goals: Progressing toward goals     Plan Discharge plan remains appropriate  Co-evaluation                 End of Session Equipment Utilized During Treatment: Rolling walker   Activity Tolerance Patient tolerated treatment well   Patient Left in chair;with family/visitor present;with call bell/phone within reach   Nurse Communication Mobility status        Time: 1450-1514 OT Time Calculation (min): 24 min  Charges: OT Evaluation $Initial OT Evaluation Tier I: 1 Procedure OT Treatments $Self Care/Home Management : 23-37 mins  Marlowe Sax M 05/13/2014, 3:19 PM

## 2014-05-13 NOTE — Progress Notes (Signed)
Site check appt set up August 3rd at 11:00 AM with Dr. Jens Som, patient given information card

## 2014-05-13 NOTE — Progress Notes (Signed)
IV and tele removed, pts wife asked to drive him to SNF, Dr. Broadus John stated that would be appropiate, pt assisted to get dressed, belongings collected and packet given to pt by CSW Rickard Rhymes, RN

## 2014-05-13 NOTE — Discharge Summary (Addendum)
Physician Discharge Summary  Manuel [name] CBJ:[id number] DOB: April 14, 1922 DOA: 05/07/2014  PCP: Manuel Frees, MD  Admit date: 05/07/2014 Discharge date: 05/13/2014  Time spent: 45 minutes  Recommendations for Outpatient Follow-up:  1. Outpatient FU with Dr.Patel outpatient Neurologist in 64month 2. Dr.Stern for incidental finding of Pituitary adenoma in 1-2 months 3. Physical therapy 4. Outpatient Cardiology FU, [facility] to evaluate Loop recorder  Discharge Diagnoses:    Acute CVA   Peripheral Neuropathy   Bilateral leg weakness   HYPERTENSION   CAD   Peripheral vascular disease, unspecified   Lower extremity weakness    Discharge Condition: stable  Diet recommendation: heart healthy  Filed Weights   05/11/14 0507 05/12/14 0522 05/13/14 0449  Weight: 86.6 kg (190 lb 14.7 oz) 87.4 kg (192 lb 10.9 oz) 87.9 kg (193 lb 12.6 oz)    History of present illness:  Chief Complaint: leg weakness  HPI: Manuel [name] is a 80 y.o. male with Past medical history of hypertension, irregular heartbeat, coronary artery disease, peripheral neuropathy presented with complaints of bilateral leg weakness, the am of admission,  When he woke up he started having this weakness and he was not able to lift himself from the bed to the restroom. At his baseline he mentioned he has been walking with a walker and a cane without any difficulty. He had history of bilateral foot drop and has an extensive neuropathy workup for the same.   Hospital Course:  1. Acute CVA-L pareital lobe -has long standing h/o Peripheral Neuropathy and gait disorder  -slowly improving  -Carotid Doppler Right: 40-59%,ICA Left: 40-59% ICA stenosis. Bilateral: Vertebral artery flow is antegrade.  -2D Echocardiogram EF 60-65% with no source of embolus.  -LDL: 85, hbaic 5.9  -Followed by NEurology, on 7/17 Dr.Sethi recommended Cards consult to eval for Anticoagulation given suspected AFib vs TEE/Loop, Seen by Cards and Loop  recorder to be implanted 7/20, did not have TEE due to h/o esoph perforation from TEE previously. -Pt/OT eval completed, SNF vs CIR today -now on Plavix, stable   2. HTN  -stable, continue ACE   3. H/o Chronic constipation  -continue colace   4. CAD  -stable, continue coreg   5. Pituitary adenoma  -incidental finding on MRI  -Per Neuro :Given age, location, doubt surgery is indicated.  Has seen Dr. Vertell Limber in the past. Can followup as OP      Consultations:  Neuro  Cards-Loop recorder 7/20  Discharge Exam: Filed Vitals:   05/13/14 0934  BP: 167/67  Pulse: 58  Temp: 97.8 F (36.6 C)  Resp: 16    General: AAOx3 Cardiovascular: S1S2/RRR Respiratory: CTAB  Discharge Instructions You were cared for by a hospitalist during your hospital stay. If you have any questions about your discharge medications or the care you received while you were in the hospital after you are discharged, you can call the unit and asked to speak with the hospitalist on call if the hospitalist that took care of you is not available. Once you are discharged, your primary care physician will handle any further medical issues. Please note that NO REFILLS for any discharge medications will be authorized once you are discharged, as it is imperative that you return to your primary care physician (or establish a relationship with a primary care physician if you do not have one) for your aftercare needs so that they can reassess your need for medications and monitor your lab values.  Discharge Instructions   Diet - low  sodium heart healthy    Complete by:  As directed      Increase activity slowly    Complete by:  As directed             Medication List         acetaminophen 650 MG CR tablet  Commonly known as:  TYLENOL  Take 650 mg by mouth every 8 (eight) hours as needed for pain (give with docusate for stool softner).     carvedilol 3.125 MG tablet  Commonly known as:  COREG  Take 1 tablet  (3.125 mg total) by mouth 2 (two) times daily.     clopidogrel 75 MG tablet  Commonly known as:  PLAVIX  Take 1 tablet (75 mg total) by mouth daily.     CoQ10 200 MG Caps  Take 200 mg by mouth daily.     docusate sodium 100 MG capsule  Commonly known as:  COLACE  Take 100 mg by mouth daily as needed for mild constipation (give with tylenol arthritis for a stool softner).     lisinopril 20 MG tablet  Commonly known as:  PRINIVIL,ZESTRIL  Take 1 tablet (20 mg total) by mouth daily. TAKE 1 TABLET BY MOUTH DAILY.     multivitamin with minerals Tabs tablet  Take 1 tablet by mouth daily. NutraLite     OMEGA 3-6-9 FATTY ACIDS PO  Take 1 capsule by mouth daily.     vitamin C 500 MG tablet  Commonly known as:  ASCORBIC ACID  Take 250 mg by mouth at bedtime.     Vitamin D 2000 UNITS tablet  Take 2,000 Units by mouth daily.     vitamin E 400 UNIT capsule  Take 400 Units by mouth daily.    Simvastatin 20mg  PO tablet Take 1 tab by mouth at bedtime   Allergies  Allergen Reactions  . Adhesive [Tape]     Plastic tape= tears skin  . Codeine Other (See Comments)    Pass out  . Ivp Dye [Iodinated Diagnostic Agents] Other (See Comments)    Doesn't remember reaction but it was severe  . Penicillins Hives and Itching  . Rofecoxib Other (See Comments)     elevated BP (Vioxx)  . Sulfonamide Derivatives Hives and Itching      The results of significant diagnostics from this hospitalization (including imaging, microbiology, ancillary and laboratory) are listed below for reference.    Significant Diagnostic Studies: Mr [name] Wo Contrast  05/08/2014   CLINICAL DATA:  Hypertension. A written may have. Bilateral leg weakness. Unable to walk.  EXAM: MRI HEAD WITHOUT CONTRAST  MRA HEAD WITHOUT CONTRAST  TECHNIQUE: Multiplanar, multiecho pulse sequences of the brain and surrounding structures were obtained without intravenous contrast. Angiographic images of the head were obtained using MRA  technique without contrast.  COMPARISON:  Head CT 01/15/2009  FINDINGS: MRI HEAD FINDINGS  Diffusion imaging shows several sub cm foci of acute cortical infarction in the left medial parietal lobe towards the vertex. No large vessel territory infarction. No significant swelling. No hemorrhage.  Elsewhere, there are mild chronic small-vessel changes of the pons. No focal cerebellar insult. There are old small vessel ischemic changes affecting the thalami and the cerebral hemispheric white matter. No mass lesion, hydrocephalus or extra-axial collection. There appears to be a pituitary mass on the left side measuring 11 x 12 mm. This was not primarily or completely evaluated. No skull or skullbase lesion.  MRA HEAD FINDINGS  Both internal carotid  arteries are widely patent into the brain. The anterior and middle cerebral vessels are patent but show some diffuse atherosclerotic irregularity. No evidence of correctable stenosis. The right vertebral artery is widely patent to the basilar and is dominant. The nondominant left vertebral artery shows a severe stenosis just proximal to the basilar. No basilar stenosis. Posterior circulation branch vessels are patent but show atherosclerotic irregularity.  IMPRESSION: Clustered areas of small cortical infarctions in the medial left parietal lobe at the vertex consistent with small infarctions in a left MCA branch vessel territory. No hemorrhage or swelling.  Chronic small vessel ischemic changes elsewhere throughout the brain.  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 This was not primarily or completely evaluated.  No major vessel occlusion. Diffuse intracranial irregularity of the medium size vessels consistent with diffuse atherosclerosis. Severe stenosis of the distal left vertebral artery just proximal to the basilar.   Electronically Signed   By: Nelson Chimes M.D.   On: 05/08/2014 11:25   [name]  05/08/2014   CLINICAL DATA:   Hypertension. A written may have. Bilateral leg weakness. Unable to walk.  EXAM: MRI HEAD WITHOUT CONTRAST  MRA HEAD WITHOUT CONTRAST  TECHNIQUE: Multiplanar, multiecho pulse sequences of the brain and surrounding structures were obtained without intravenous contrast. Angiographic images of the head were obtained using MRA technique without contrast.  COMPARISON:  Head CT 01/15/2009  FINDINGS: MRI HEAD FINDINGS  Diffusion imaging shows several sub cm foci of acute cortical infarction in the left medial parietal lobe towards the vertex. No large vessel territory infarction. No significant swelling. No hemorrhage.  Elsewhere, there are mild chronic small-vessel changes of the pons. No focal cerebellar insult. There are old small vessel ischemic changes affecting the thalami and the cerebral hemispheric white matter. No mass lesion, hydrocephalus or extra-axial collection. There appears to be a pituitary mass on the left side measuring 11 x 12 mm. This was not primarily or completely evaluated. No skull or skullbase lesion.  MRA HEAD FINDINGS  Both internal carotid arteries are widely patent into the brain. The anterior and middle cerebral vessels are patent but show some diffuse atherosclerotic irregularity. No evidence of correctable stenosis. The right vertebral artery is widely patent to the basilar and is dominant. The nondominant left vertebral artery shows a severe stenosis just proximal to the basilar. No basilar stenosis. Posterior circulation branch vessels are patent but show atherosclerotic irregularity.  IMPRESSION: Clustered areas of small cortical infarctions in the medial left parietal lobe at the vertex consistent with small infarctions in a left MCA branch vessel territory. No hemorrhage or swelling.  Chronic small vessel ischemic changes elsewhere throughout the brain.  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 This was not primarily or completely evaluated.  No major vessel  occlusion. Diffuse intracranial irregularity of the medium size vessels consistent with diffuse atherosclerosis. Severe stenosis of the distal left vertebral artery just proximal to the basilar.   Electronically Signed   By: Nelson Chimes M.D.   On: 05/08/2014 11:25   Mr Thoracic Spine Wo Contrast  05/07/2014   CLINICAL DATA:  Lower extremity weakness.  EXAM: MRI THORACIC SPINE WITHOUT CONTRAST  TECHNIQUE: Multiplanar, multisequence MR imaging of the thoracic spine was performed. No intravenous contrast was administered.  COMPARISON:  Prior MRI of the lumbar spine performed earlier on the same day.  FINDINGS: The vertebral bodies are normally aligned with preservation of the  normal thoracic kyphosis. Vertebral body heights are well preserved. No fracture or listhesis.  Mild multilevel degenerative intervertebral disc space narrowing seen throughout the thoracic spine with associated chronic endplate Schmorl's nodes. No significant degenerative disc bulge identified. No focal disc protrusions. Mild multilevel facet arthropathy noted. No central canal stenosis or cord compression. No significant foraminal narrowing identified.  The visualized paraspinous soft tissues are within normal limits. Visualized lungs are clear.  IMPRESSION: No acute abnormality identified within the thoracic spine. No evidence of cord compression or other intrinsic spinal cord abnormality.   Electronically Signed   By: Jeannine Boga M.D.   On: 05/07/2014 23:38   Mr Lumbar Spine Wo Contrast  05/07/2014   CLINICAL DATA:  Sudden onset bilateral lower extremity weakness.  EXAM: MRI LUMBAR SPINE WITHOUT CONTRAST  TECHNIQUE: Multiplanar, multisequence MR imaging of the lumbar spine was performed. No intravenous contrast was administered.  COMPARISON:  03/10/2012  FINDINGS: Vertebral alignment is normal. Vertebral body heights are preserved without compression fracture. Moderate disc space narrowing and associated degenerative marrow  changes are again seen at L4-5. Right-sided pars defect is again noted at L5. 10 mm T1 and T2 hypointense lesion in the L3 vertebral body is unchanged, sclerotic on prior CT. The conus medullaris is normal in signal and terminates at L1. Small T2 hyperintense lesion arising from the upper pole of the left kidney is unchanged, most likely a cyst.  T12-L1 and L1-2:  Negative.  L2-3: Mild disc bulge and facet and ligamentum flavum hypertrophy without spinal canal or neural foraminal stenosis, unchanged.  L3-4: Mild disc bulge and facet and ligamentum flavum hypertrophy result in mild spinal stenosis, mild left greater the right lateral recess stenosis, and mild left neural foraminal narrowing, unchanged.  L4-5: Prior laminectomies with decompressed spinal canal. Disc bulging and spurring asymmetric to the right and mild facet and ligamentum flavum hypertrophy result in mild right neural foraminal stenosis, unchanged.  L5-S1: Mild facet hypertrophy result in mild left greater than right neural foraminal narrowing without spinal stenosis, unchanged.  IMPRESSION: Unchanged appearance of the lumbar spine without acute abnormality identified. Moderate multilevel spondylosis resulting in mild spinal canal and lateral recess stenosis at L3-4 and mild right neural foraminal stenosis at L4-5.   Electronically Signed   By: Logan Bores   On: 05/07/2014 19:04    Microbiology: No results found for this or any previous visit (from the past 240 hour(s)).   Labs: Basic Metabolic Panel:  Recent Labs Lab 05/07/14 1531 05/08/14 0537  NA 141 143  K 4.9 4.5  CL 105 106  CO2 24 22  GLUCOSE 93 96  BUN 22 19  CREATININE 1.05 1.05  CALCIUM 9.2 9.2   Liver Function Tests:  Recent Labs Lab 05/08/14 0537  AST 16  ALT 11  ALKPHOS 52  BILITOT 0.5  PROT 6.8  ALBUMIN 3.7   No results found for this basename: LIPASE, AMYLASE,  in the last 168 hours No results found for this basename: AMMONIA,  in the last 168  hours CBC:  Recent Labs Lab 05/07/14 1531 05/08/14 0537  WBC 6.7 6.6  HGB 13.8 13.4  HCT 41.2 40.7  MCV 87.8 87.7  PLT 187 202   Cardiac Enzymes: No results found for this basename: CKTOTAL, CKMB, CKMBINDEX, TROPONINI,  in the last 168 hours BNP: BNP (last 3 results) No results found for this basename: PROBNP,  in the last 8760 hours CBG: No results found for this basename: GLUCAP,  in the last  168 hours     Signed:  Abdurrahman Petersheim  [facility] 05/13/2014, 12:01 PM

## 2014-05-13 NOTE — Progress Notes (Signed)
Stroke Team Progress Note  HISTORY Manuel [name] is an 80 y.o. male history of hypertension, coronary artery disease, carotid stenosis and peripheral neuropathy with left LE weakness, presenting with new onset weakness involving right lower extremity. Patient is usually ambulatory independently at home. He woke up with inability to move his lower extremities, left chronic but right acutely new. He said no problems with control of urination. There has been chronic loss of sensation in lower extremities. He said no symptoms involving his upper extremities. He's been experiencing lower back pain in the sacral region. MRI of his lumbar spine showed multilevel degenerative changes but no acute abnormality. Patient's strength has been improving throughout the day with noticeable return of ability to move his feet as well as ability to at least slightly lift his legs.   Patient was not administered TPA secondary to delay in arrival, stroke not recognized on arrival. He was admitted for further evaluation and treatment.  SUBJECTIVE Wife at bedside during round. He went for TEE but not able to complete due to previous hx of esophagus perforation. Loop recorder placed by cardiology. He stated that his right LE weakness improved but not back to baseline yet. PT/OT recommend CIR.  OBJECTIVE Most recent Vital Signs: Filed Vitals:   05/13/14 0449 05/13/14 0837 05/13/14 0934 05/13/14 1352  BP: 147/62 180/66 167/67 161/57  Pulse: 56 56 58 57  Temp: 97.8 F (36.6 C)  97.8 F (36.6 C) 98 F (36.7 C)  TempSrc: Oral  Oral Oral  Resp: 18  16 18   Height:      Weight: 193 lb 12.6 oz (87.9 kg)     SpO2: 96%  97% 97%   CBG (last 3)  No results found for this basename: GLUCAP,  in the last 72 hours  IV Fluid Intake:   . sodium chloride 500 mL (05/13/14 0941)    MEDICATIONS  . carvedilol  3.125 mg Oral BID  . clopidogrel  75 mg Oral Daily  . heparin  5,000 Units Subcutaneous 3 times per day  . lisinopril   20 mg Oral Daily   PRN:  acetaminophen, acetaminophen, docusate sodium, ondansetron (ZOFRAN) IV, ondansetron  Diet:  NPO thin liquids Activity:   Up with assistance DVT Prophylaxis:  Heparin 5000 units sq tid   CLINICALLY SIGNIFICANT STUDIES Basic Metabolic Panel:   Recent Labs Lab 05/07/14 1531 05/08/14 0537  NA 141 143  K 4.9 4.5  CL 105 106  CO2 24 22  GLUCOSE 93 96  BUN 22 19  CREATININE 1.05 1.05  CALCIUM 9.2 9.2   Liver Function Tests:   Recent Labs Lab 05/08/14 0537  AST 16  ALT 11  ALKPHOS 52  BILITOT 0.5  PROT 6.8  ALBUMIN 3.7   CBC:   Recent Labs Lab 05/07/14 1531 05/08/14 0537  WBC 6.7 6.6  HGB 13.8 13.4  HCT 41.2 40.7  MCV 87.8 87.7  PLT 187 202   Coagulation:   Recent Labs Lab 05/08/14 0537  LABPROT 13.8  INR 1.06   Cardiac Enzymes: No results found for this basename: CKTOTAL, CKMB, CKMBINDEX, TROPONINI,  in the last 168 hours Urinalysis: No results found for this basename: COLORURINE, APPERANCEUR, LABSPEC, PHURINE, GLUCOSEU, HGBUR, BILIRUBINUR, KETONESUR, PROTEINUR, UROBILINOGEN, NITRITE, LEUKOCYTESUR,  in the last 168 hours Lipid Panel    Component Value Date/Time   CHOL 139 05/09/2014 0535   TRIG 131 05/09/2014 0535   HDL 28* 05/09/2014 0535   CHOLHDL 5.0 05/09/2014 0535   VLDL 26  05/09/2014 0535   LDLCALC 85 05/09/2014 0535   HgbA1C  Lab Results  Component Value Date   HGBA1C 5.9* 05/08/2014    Urine Drug Screen:   No results found for this basename: labopia,  cocainscrnur,  labbenz,  amphetmu,  thcu,  labbarb    Alcohol Level: No results found for this basename: ETH,  in the last 168 hours  Mr Thoracic Spine Wo Contrast  05/07/2014   No acute abnormality identified within the thoracic spine. No evidence of cord compression or other intrinsic spinal cord abnormality.     Mr Lumbar Spine Wo Contrast  05/07/2014    Unchanged appearance of the lumbar spine without acute abnormality identified. Moderate multilevel spondylosis  resulting in mild spinal canal and lateral recess stenosis at L3-4 and mild right neural foraminal stenosis at L4-5.     MRI of the brain  05/08/2014   Clustered areas of small cortical infarctions in the medial left parietal lobe at the vertex consistent with small infarctions in a left MCA branch vessel territory. No hemorrhage or swelling.  Chronic small vessel ischemic changes elsewhere throughout the brain.  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 This was not primarily or completely evaluated.    MRA of the brain  05/08/2014    No major vessel occlusion. Diffuse intracranial irregularity of the medium size vessels consistent with diffuse atherosclerosis. Severe stenosis of the distal left vertebral artery just proximal to the basilar.  Carotid Doppler  Right: 40-59%, upper end of scale, internal carotid artery stenosis. Left: 40-59% internal carotid artery stenosis. Bilateral: Vertebral artery flow is antegrade.   2D Echocardiogram  EF 60-65% with no source of embolus.   EKG  normal sinus rhythm. For complete results please see formal report.   Therapy Recommendations CIR   Physical Exam   Pleasant elderly Caucasian male not in distress.Awake alert. Afebrile. Head is nontraumatic. Neck is supple without bruit. Hearing is normal. Cardiac exam no murmur or gallop. Lungs are clear to auscultation. Distal pulses are well felt. Neurological Exam : Awake alert oriented x3 with normal speech and language function. Fundi were not visualized. Vision acuity seems adequate. Extraocular movements are full range without nystagmus. Blinks to threat bilaterally. Face is symmetric without weakness. Motor system exam reveals no upper or lower extremity drift. B/l LE 4/5 proximal, distally bilateral footdrop with ankle dorsiflexor and plantar evertor weakness which is old from neuropathy. Diminished sensation in both feet in a stocking distribution and impaired vibration of her toes  bilaterally as well as decreased proprioception bilaterally. Both plantars are downgoing. Gait was not tested due to safety.  ASSESSMENT Mr. Manuel [name] is a 80 y.o. male presenting with with known peripheral neuropathy and weakness distally in both lower extremities admitted to [facility] due to worsening R LE weakness that according to patient developed rather abruptly on 7/14. Not intially thought to have a stroke, however MRI imaging now confirms multiple left ACA branch infarcts. Infarcts felt to be embolic secondary to unknown etiolology. Patient reports symptoms c/w atrial fibrillation (flutter in chest, especially with activity, wakes him during the night), though he has no formal diagnosis.  On no antithrombotics prior to admission. Now on clopidogrel 75 mg orally every day for secondary stroke prevention.   Carotid stenosis Wife stated that right carotid stenosis in the past 60% This time bilateral carotid stenosis 40-59% according to [facility] Continue to monitori Pt has outpt vascular surgeon Dr. Scot Dock to follow  every 6 months.  hypertension On coreg, lisinopril at home  Hx GI bleeding 2010 Clipped at that time, no bleeding since on aspirin until a couple of years ago Placed on plavix this admission   Neuropathy Etiology unclear Followed by Dr. Posey Pronto at [facility] as an OP  Pituitary adenoma incidental finding Given age, location, doubt surgery is indicated. Has seen Dr. Vertell Limber in the past. Can followup as OP  Additional stroke risk factors:  CAD  Hospital day # 6  TREATMENT/PLAN  Continue clopidogrel 75 mg orally every day for secondary stroke prevention for now.   Evaluated by cardiology, not able to do TEE but implanted loop today.   Prefer CIR discharge  Consider low dose statin per cardiology recs  Bilateral 40-59% carotid stenosis per carotid doppler, will follow as outpt with his vascular surgeon, Dr. Scot Dock  Rehab following  Follow up with Dr. Vertell Limber  as outpatient for likely incidental finding of pituitary adenoma  Outpatient f/u with Dr Posey Pronto- patient`s neurologist for neuropathy and stroke    To contact Stroke Continuity provider, please refer to [email or web address] After hours, contact General Neurology

## 2014-05-13 NOTE — Consult Note (Signed)
Patient Care Team: Shirline Frees, MD as PCP - General (Family Medicine)   HPI  Manuel [name] is a 80 y.o. male Admitted with worseingin gait and found to have muliptle infarcts in L MCA teritory  He has hx of recurrent tachypalps with shortness of breath  TEE deferred 2/2 risks      Past Medical History  Diagnosis Date  . Hypertension   . Irregular heart beat   . Rectal bleed   . Mallory-Weiss tear 2011    treated with 6 units PRBCs. No longer on aspirin  . Carotid artery occlusion   . Zenker diverticulum   . Colon polyps   . Gastric hemorrhage   . CAD (coronary artery disease)   . Hearing loss   . Peripheral neuropathy     Past Surgical History  Procedure Laterality Date  . Back surgery    . Appendectomy    . Abdominal surgery    . Hernia repair      Current Facility-Administered Medications  Medication Dose Route Frequency Provider Last Rate Last Dose  . 0.9 %  sodium chloride infusion   Intravenous Continuous Dayna N Dunn, PA-C 20 mL/hr at 05/13/14 0941 500 mL at 05/13/14 0941  . [facility] HOLD] acetaminophen (TYLENOL) tablet 650 mg  650 mg Oral Q6H PRN Berle Mull, MD   325 mg at 05/13/14 4097   Or  . [facility] HOLD] acetaminophen (TYLENOL) suppository 650 mg  650 mg Rectal Q6H PRN Berle Mull, MD      . [facility] HOLD] carvedilol (COREG) tablet 3.125 mg  3.125 mg Oral BID Berle Mull, MD   3.125 mg at 05/12/14 1000  . [[facility] HOLD] clopidogrel (PLAVIX) tablet 75 mg  75 mg Oral Daily Dorian Pod, MD   75 mg at 05/12/14 1000  . [MAR HOLD] docusate sodium (COLACE) capsule 100 mg  100 mg Oral Daily PRN Berle Mull, MD      . [facility] HOLD] heparin injection 5,000 Units  5,000 Units Subcutaneous 3 times per day Berle Mull, MD   5,000 Units at 05/13/14 [phone]  . [[facility] HOLD] lisinopril (PRINIVIL,ZESTRIL) tablet 20 mg  20 mg Oral Daily Berle Mull, MD   20 mg at 05/13/14 0915  . [[facility] HOLD] ondansetron (ZOFRAN) tablet 4 mg  4 mg Oral Q6H PRN Berle Mull, MD       Or    . [facility] HOLD] ondansetron (ZOFRAN) injection 4 mg  4 mg Intravenous Q6H PRN Berle Mull, MD        Allergies  Allergen Reactions  . Adhesive [Tape]     Plastic tape= tears skin  . Codeine Other (See Comments)    Pass out  . Ivp Dye [Iodinated Diagnostic Agents] Other (See Comments)    Doesn't remember reaction but it was severe  . Penicillins Hives and Itching  . Rofecoxib Other (See Comments)     elevated BP (Vioxx)  . Sulfonamide Derivatives Hives and Itching    Review of Systems negative except from HPI and PMH  Physical Exam BP 167/67  Pulse 58  Temp(Src) 97.8 F (36.6 C) (Oral)  Resp 16  Ht 6\' 2"  (1.88 m)  Wt 193 lb 12.6 oz (87.9 kg)  BMI 24.87 kg/m2  SpO2 97% Well developed and well nourished in no acute distress HENT normal E scleral and icterus clear Neck Supple JVP flat; carotids brisk and full Clear to ausculation regular rate and rhythm, no murmurs gallops or rub Soft with active  bowel sounds No clubbing cyanosis none Edema Alert and oriented, grossly normal motor and sensory function Skin Warm and Dry    Assessment and  Plan Cryptogenic stroke  Tachypalpiations  It is reasonable to pursue loop recorder insertion for his palpitations as well as cryptogenic stroke, he is at high risk for atrial fibrillation and is agreeable to loop recorder isnertion

## 2014-05-13 NOTE — Progress Notes (Signed)
Report called to Quillian Quince at [facility], family made aware of discharge Rickard Rhymes, RN

## 2014-05-13 NOTE — Progress Notes (Signed)
CARE MANAGEMENT NOTE 05/13/2014  Patient:  Manuel [name], Manuel [name]   Account Number:  [id number]  Date Initiated:  05/08/2014  Documentation initiated by:  AMERSON,JULIE  Subjective/Objective Assessment:   Pt adm on 05/07/14 with acute onset bilat leg weakness. PTA, pt resides at home with spouse.     Action/Plan:   PT/OT recommending SNF at dc; CSW following to facilitate dc to possible SNF when medically stable for dc.   Anticipated DC Date:  05/10/2014   Anticipated DC Plan:  SKILLED NURSING FACILITY  In-house referral  Clinical Social Worker      DC Planning Services  CM consult      Choice offered to / List presented to:             Status of service:  Completed, signed off Medicare Important Message given?  YES (If response is "NO", the following Medicare IM given date fields will be blank) Date Medicare IM given:  05/13/2014 Medicare IM given by:  [facility] Date Additional Medicare IM given:   Additional Medicare IM given by:    Discharge Disposition:  [place]  Per UR Regulation:  Reviewed for med. necessity/level of care/duration of stay  If discussed at [place] of Stay Meetings, dates discussed:    Comments:  05/13/2014 CIR was denied. Plan dc to SNF. Jonnie Finner RN CCM Case Mgmt phone [phone]

## 2014-05-13 NOTE — Progress Notes (Signed)
Rehab admission - I have received a denial from [facility] for acute inpatient rehab admission.  I will discuss with attending and with case manager.  Patient approved for SNF.  Call me for questions.  #[phone]

## 2014-05-14 ENCOUNTER — Non-Acute Institutional Stay (SKILLED_NURSING_FACILITY): Payer: Medicare Other | Admitting: Internal Medicine

## 2014-05-14 DIAGNOSIS — I6350 Cerebral infarction due to unspecified occlusion or stenosis of unspecified cerebral artery: Secondary | ICD-10-CM

## 2014-05-14 DIAGNOSIS — I2510 Atherosclerotic heart disease of native coronary artery without angina pectoris: Secondary | ICD-10-CM

## 2014-05-14 DIAGNOSIS — I6389 Other cerebral infarction: Secondary | ICD-10-CM

## 2014-05-14 DIAGNOSIS — K5900 Constipation, unspecified: Secondary | ICD-10-CM

## 2014-05-14 DIAGNOSIS — I10 Essential (primary) hypertension: Secondary | ICD-10-CM

## 2014-05-15 DIAGNOSIS — K5900 Constipation, unspecified: Secondary | ICD-10-CM | POA: Insufficient documentation

## 2014-05-15 NOTE — Progress Notes (Signed)
HISTORY & PHYSICAL  DATE: 05/14/2014   FACILITY: [facility]  LEVEL OF CARE: SNF (31)  ALLERGIES:  Allergies  Allergen Reactions  . Adhesive [Tape]     Plastic tape= tears skin  . Codeine Other (See Comments)    Pass out  . Ivp Dye [Iodinated Diagnostic Agents] Other (See Comments)    Doesn't remember reaction but it was severe  . Penicillins Hives and Itching  . Rofecoxib Other (See Comments)     elevated BP (Vioxx)  . Sulfonamide Derivatives Hives and Itching    CHIEF COMPLAINT:  Manage CVA, CAD and hypertension  HISTORY OF PRESENT ILLNESS: 80 year old Caucasian male was hospitalized secondary to bilateral lower extremity weakness. After hospitalization he admitted to this facility for short-term rehabilitation.  CVA: The patient's CVA remains stable.  Patient denies new neurologic symptoms such as numbness, tingling, weakness, speech difficulties or visual disturbances.  No complications reported from the medications currently being used. Patient had a left parietal CVA.  HTN: Pt 's HTN remains stable.  Denies CP, sob, DOE, pedal edema, headaches, dizziness or visual disturbances.  No complications from the medications currently being used.  Last BP : 116/62.  CAD: The angina has been stable. The patient denies dyspnea on exertion, orthopnea, pedal edema, palpitations and paroxysmal nocturnal dyspnea. No complications noted from the medication presently being used.  PAST MEDICAL HISTORY :  Past Medical History  Diagnosis Date  . Hypertension   . Irregular heart beat   . Rectal bleed   . Mallory-Weiss tear 2011    treated with 6 units PRBCs. No longer on aspirin  . Carotid artery occlusion   . Zenker diverticulum   . Colon polyps   . Gastric hemorrhage   . CAD (coronary artery disease)   . Hearing loss   . Peripheral neuropathy     PAST SURGICAL HISTORY: Past Surgical History  Procedure Laterality Date  . Back surgery    .  Appendectomy    . Abdominal surgery    . Hernia repair      SOCIAL HISTORY:  reports that he has never smoked. He has never used smokeless tobacco. He reports that he does not drink alcohol or use illicit drugs.  FAMILY HISTORY:  Family History  Problem Relation Age of Onset  . Cancer Mother     Deceased, 56  . Heart attack Father     Deceased, 57  . Heart disease Sister     Heart Disease before age 63  . Diabetes Son   . Parkinson's disease Sister   . Stroke Other     Deceased, siblings  . Heart disease Other     Deceased, siblings    CURRENT MEDICATIONS: Reviewed per MAR/see medication list  REVIEW OF SYSTEMS:  See HPI otherwise 14 point ROS is negative.  PHYSICAL EXAMINATION  VS:  See VS section  GENERAL: no acute distress, normal body habitus EYES: conjunctivae normal, sclerae normal, normal eye lids MOUTH/THROAT: lips without lesions,no lesions in the mouth,tongue is without lesions,uvula elevates in midline NECK: supple, trachea midline, no neck masses, no thyroid tenderness, no thyromegaly LYMPHATICS: no LAN in the neck, no supraclavicular LAN RESPIRATORY: breathing is even & unlabored, BS CTAB CARDIAC: RRR, no murmur,no extra heart sounds, no edema GI:  ABDOMEN: abdomen soft, normal BS, no masses, no tenderness  LIVER/SPLEEN: no hepatomegaly, no splenomegaly MUSCULOSKELETAL: HEAD: normal to inspection  EXTREMITIES: LEFT UPPER EXTREMITY: full range of motion,  normal strength & tone RIGHT UPPER EXTREMITY:  full range of motion, normal strength & tone LEFT LOWER EXTREMITY:  full range of motion, normal strength & tone RIGHT LOWER EXTREMITY:  full range of motion, normal strength & tone PSYCHIATRIC: the patient is alert & oriented to person, affect & behavior appropriate  LABS/RADIOLOGY:  Labs reviewed: Basic Metabolic Panel:  Recent Labs  01/16/14 1245 05/07/14 1531 05/08/14 0537  NA 137 141 143  K 4.7 4.9 4.5  CL 106 105 106  CO2 26 24 22     GLUCOSE 96 93 96  BUN 21 22 19   CREATININE 1.1 1.05 1.05  CALCIUM 9.3 9.2 9.2   Liver Function Tests:  Recent Labs  05/08/14 0537  AST 16  ALT 11  ALKPHOS 52  BILITOT 0.5  PROT 6.8  ALBUMIN 3.7   CBC:  Recent Labs  08/09/13 2157 05/07/14 1531 05/08/14 0537  WBC 7.8 6.7 6.6  NEUTROABS 4.6  --   --   HGB 13.3 13.8 13.4  HCT 39.2 41.2 40.7  MCV 86.5 87.8 87.7  PLT 210 187 202   Lipid Panel:  Recent Labs  05/09/14 0535  HDL 28*    Transthoracic Echocardiography  Patient:    Manuel, [name] MR #:       [id number] Study Date: 05/09/2014 Gender:     M Age:        52 Height:     188 cm Weight:     87.1 kg BSA:        2.14 m^2 Pt. Status: Room:       [place], [place]  [place], [place]  Manuel [name] [id number]  SONOGRAPHER  [place], [place], Inpatient  cc:  ------------------------------------------------------------------- LV EF: 60% -   65%  ------------------------------------------------------------------- Indications:      CVA 25.  ------------------------------------------------------------------- History:   PMH:   Coronary artery disease.  Risk factors: Hypertension.  ------------------------------------------------------------------- Study Conclusions  - Left ventricle: The cavity size was normal. Wall thickness was   normal. Systolic function was normal. The estimated ejection   fraction was in the range of 60% to 65%. - Aortic valve: There was mild regurgitation. - Left atrium: The atrium was moderately dilated.  Transthoracic echocardiography.  M-mode, complete 2D, spectral Doppler, and color Doppler.  Birthdate:  Patient birthdate: Mar 17, 1922.  Age:  Patient is 80 yr old.  Sex:  Gender: male. Height:  Height: 188 cm. Height: 74 in.  Weight:  Weight: 87.1 kg. Weight: 191.6 lb.  Body mass index:  BMI: 24.7 kg/m^2.   Body surface area:    BSA: 2.14 m^2.  Blood pressure:     139/89 Patient status:  Inpatient.  Study date:  Study date: 05/09/2014. Study time: 12:16 PM.  Location:  Bedside.  -------------------------------------------------------------------  ------------------------------------------------------------------- Left ventricle:  The cavity size was normal. Wall thickness was normal. Systolic function was normal. The estimated ejection fraction was in the range of 60% to 65%.  ------------------------------------------------------------------- Aortic valve:   Mildly thickened leaflets.  Doppler:  There was mild regurgitation.  ------------------------------------------------------------------- Mitral valve:   Mildly thickened leaflets .  Doppler:  There was no significant regurgitation.  ------------------------------------------------------------------- Left atrium:  The atrium was moderately dilated.  ------------------------------------------------------------------- Right ventricle:  The cavity size was normal. Wall thickness was normal. Systolic function was normal.  ------------------------------------------------------------------- Tricuspid  valve:   Structurally normal valve.   Leaflet separation was normal.  Doppler:  Transvalvular velocity was within the normal range. There was no regurgitation.  ------------------------------------------------------------------- Right atrium:  The atrium was normal in size.  ------------------------------------------------------------------- Pericardium:  There was no pericardial effusion.  ------------------------------------------------------------------- Systemic veins: Inferior vena cava: The vessel was normal in size. The respirophasic diameter changes were in the normal range (>= 50%), consistent with normal central venous pressure.  ------------------------------------------------------------------- Prepared and  Electronically Authenticated by  Dorris Carnes, M.D. 2015-07-16T15:55:53  ------------------------------------------------------------------- Measurements   Left ventricle                   Value        08/22/2009 Reference  LV ID, ED, PLAX chordal  (N)     49    mm     39         43 - 52  LV ID, ES, PLAX chordal  (H)     40.7  mm     22         23 - 38  LV fx shortening, PLAX   (L)     17    %      44         >=29  chordal  LV PW thickness, ED              10.3  mm     13         ---------  IVS/LV PW ratio, ED      (N)     1.05         1          <=1.3  LV e&', lateral                   5.81  cm/s   8.68       ---------  LV E/e&', lateral                 8.97         6.94       ---------  LV e&', medial                    6.47  cm/s   8.68       ---------  LV E/e&', medial                  8.05         6.94       ---------  LV e&', average                   6.14  cm/s   ---------- ---------  LV E/e&', average                 8.49         ---------- ---------    Ventricular septum               Value        08/22/2009 Reference  IVS thickness, ED                10.8  mm     13         ---------    Aortic valve                     Value        08/22/2009 Reference  Aortic  regurg pressure           530   ms     ---------- ---------  half-time    Aorta                            Value        08/22/2009 Reference  Aortic root ID, ED               37    mm     37         ---------    Left atrium                      Value        08/22/2009 Reference  LA ID, A-P, ES                   36    mm     24         ---------  LA ID/bsa, A-P           (N)     1.68  cm/m^2 1.2        <=2.2    Mitral valve                     Value        08/22/2009 Reference  Mitral E-wave peak               52.1  cm/s   60.2       ---------  velocity  Mitral A-wave peak               73.7  cm/s   61.2       ---------  velocity  Mitral deceleration time (H)     264   ms     268        150 - 230  Mitral E/A  ratio, peak           0.7          1          ---------    Systemic veins                   Value        08/22/2009 Reference  Estimated CVP                    3     mm Hg  10         ---------    Right ventricle                  Value        08/22/2009 Reference  RV s&', lateral, S                16.4  cm/s   ---------- ---------  Noninvasive Vascular Lab  Carotid Duplex Study  Patient:    Manuel, [name] MR #:       [id number] Study Date: 05/09/2014 Gender:     M Age:        13 Height: Weight: BSA: Pt. Status: Room:       [id number]   SONOGRAPHER  Delman Kitten, RCS  ATTENDING    Domenic Polite [address] [phone]  Manuel [name] [phone]  [place]  [place] [id number]  Reports also to:  ------------------------------------------------------------------- History and indications:  Indications  CVA/Stroke. Prior study from 12/12/2013 is available for comparison.  ------------------------------------------------------------------- Study information:  Study status:  Routine.  Procedure:  The right CCA, right ECA, right ICA, right vertebral, left CCA, left ECA, left ICA, and left vertebral arteries were examined. A vascular evaluation was performed with the patient in the supine position. The study was technically limited.    Carotid duplex study.     Carotid Duplex exam including 2D imaging, color and spectral Doppler were performed on the extracranial carotid and vertebral arteries using standard established protocols.  Birthdate:  Patient birthdate: 1921/10/28.  Age:  Patient is 80 yr old.  Sex:  Gender: male. Study date:  Study date: 05/09/2014. Study time: 03:48 PM. Location:  Bedside.  Patient status:  Inpatient.  Arterial flow:  +---------------+---------+--------+--------------+---------------+ Location       V sys    V ed    Flow analysis Comment          +---------------+---------+--------+--------------+---------------+ Right CCA -    115 cm/s 11 cm/s --------------Trivial intimal proximal                                      thickening.     +---------------+---------+--------+--------------+---------------+ Right CCA -    -118 cm/s-13 cm/s--------------Mild            distal                                        heterogenous                                                  plaque.         +---------------+---------+--------+--------------+---------------+ Right ECA      215 cm/s 0 cm/s  ----------------------------- +---------------+---------+--------+--------------+---------------+ Right ICA -    -184 cm/s-16 cm/s--------------Mild to         proximal                                      moderate                                                      heterogenous                                                  plaque.         +---------------+---------+--------+--------------+---------------+ Right ICA - mid277 cm/s 34 cm/s --------------Moderate  homogenous                                                    plaque.         +---------------+---------+--------+--------------+---------------+ Right ICA -    -150 cm/s-22 cm/s----------------------------- distal                                                        +---------------+---------+--------+--------------+---------------+ Right vertebral57 cm/s  13 cm/s Antegrade flow--------------- +---------------+---------+--------+--------------+---------------+ Left CCA -     102 cm/s 9 cm/s  ----------------------------- proximal                                                      +---------------+---------+--------+--------------+---------------+ Left CCA -     183 cm/s 18 cm/s --------------Mild to         distal                                         moderate                                                      diffuse mixed                                                 plaque.                                                       Localized                                                     anechoic area                                                 within plaque.                                                Cannot rule out  ulcerated                                                     plaque.         +---------------+---------+--------+--------------+---------------+ Left ECA       -281 cm/s0 cm/s  --------------Mild                                                          heterogenous                                                  plaque at the                                                 origin.         +---------------+---------+--------+--------------+---------------+ Left ICA -     248 cm/s 30 cm/s --------------Mild to         proximal                                      moderate mixed                                                plaque at the                                                 origin.         +---------------+---------+--------+--------------+---------------+ Left ICA - mid -138 cm/s-25 cm/s----------------------------- +---------------+---------+--------+--------------+---------------+ Left ICA -     -126 cm/s-17 cm/s----------------------------- distal                                                        +---------------+---------+--------+--------------+---------------+ Left vertebral -86 cm/s -13 cm/sAntegrade flow--------------- +---------------+---------+--------+--------------+---------------+ Right ICA/CCA  -------------------------------2.35            ratio                                                          +---------------+---------+--------+--------------+---------------+ Left ICA/CCA   -------------------------------1.36            ratio                                                         +---------------+---------+--------+--------------+---------------+  -------------------------------------------------------------------  Summary:  - The vertebral arteries appear patent with antegrade flow. - Findings consistent with 40 - 59 percent stenosis, upper end of   scale, involving the right internal carotid artery. - Findings consistent with 74 - 89 percent stenosis involving the   left internal carotid artery. - Technically difficult due to possible arrhythmia.  MRI LUMBAR SPINE WITHOUT CONTRAST   TECHNIQUE: Multiplanar, multisequence MR imaging of the lumbar spine was performed. No intravenous contrast was administered.   COMPARISON:  03/10/2012   FINDINGS: Vertebral alignment is normal. Vertebral body heights are preserved without compression fracture. Moderate disc space narrowing and associated degenerative marrow changes are again seen at L4-5. Right-sided pars defect is again noted at L5. 10 mm T1 and T2 hypointense lesion in the L3 vertebral body is unchanged, sclerotic on prior CT. The conus medullaris is normal in signal and terminates at L1. Small T2 hyperintense lesion arising from the upper pole of the left kidney is unchanged, most likely a cyst.   T12-L1 and L1-2:  Negative.   L2-3: Mild disc bulge and facet and ligamentum flavum hypertrophy without spinal canal or neural foraminal stenosis, unchanged.   L3-4: Mild disc bulge and facet and ligamentum flavum hypertrophy result in mild spinal stenosis, mild left greater the right lateral recess stenosis, and mild left neural foraminal narrowing, unchanged.   L4-5: Prior laminectomies with decompressed spinal canal. Disc bulging and spurring asymmetric to the right  and mild facet and ligamentum flavum hypertrophy result in mild right neural foraminal stenosis, unchanged.   L5-S1: Mild facet hypertrophy result in mild left greater than right neural foraminal narrowing without spinal stenosis, unchanged.   IMPRESSION: Unchanged appearance of the lumbar spine without acute abnormality identified. Moderate multilevel spondylosis resulting in mild spinal canal and lateral recess stenosis at L3-4 and mild right neural foraminal stenosis at L4-5. MRI THORACIC SPINE WITHOUT CONTRAST   TECHNIQUE: Multiplanar, multisequence MR imaging of the thoracic spine was performed. No intravenous contrast was administered.   COMPARISON:  Prior MRI of the lumbar spine performed earlier on the same day.   FINDINGS: The vertebral bodies are normally aligned with preservation of the normal thoracic kyphosis. Vertebral body heights are well preserved. No fracture or listhesis.   Mild multilevel degenerative intervertebral disc space narrowing seen throughout the thoracic spine with associated chronic endplate Schmorl's nodes. No significant degenerative disc bulge identified. No focal disc protrusions. Mild multilevel facet arthropathy noted. No central canal stenosis or cord compression. No significant foraminal narrowing identified.   The visualized paraspinous soft tissues are within normal limits. Visualized lungs are clear.   IMPRESSION: No acute abnormality identified within the thoracic spine. No evidence of cord compression or other intrinsic spinal cord abnormality.   MRI HEAD WITHOUT CONTRAST   MRA HEAD WITHOUT CONTRAST   TECHNIQUE: Multiplanar, multiecho pulse sequences of the brain and surrounding structures were obtained without intravenous contrast. Angiographic images of the head were obtained using MRA technique without contrast.   COMPARISON:  Head CT 01/15/2009   FINDINGS: MRI HEAD FINDINGS   Diffusion imaging shows several sub  cm foci of acute cortical infarction in the left medial parietal lobe towards the vertex. No large vessel territory infarction. No significant swelling. No hemorrhage.   Elsewhere, there are mild chronic small-vessel changes of the pons. No focal cerebellar insult. There are old small vessel ischemic changes affecting the thalami and the cerebral hemispheric white matter. No mass lesion, hydrocephalus or extra-axial collection. There appears to be a pituitary mass  on the left side measuring 11 x 12 mm. This was not primarily or completely evaluated. No skull or skullbase lesion.   MRA HEAD FINDINGS   Both internal carotid arteries are widely patent into the brain. The anterior and middle cerebral vessels are patent but show some diffuse atherosclerotic irregularity. No evidence of correctable stenosis. The right vertebral artery is widely patent to the basilar and is dominant. The nondominant left vertebral artery shows a severe stenosis just proximal to the basilar. No basilar stenosis. Posterior circulation branch vessels are patent but show atherosclerotic irregularity.   IMPRESSION: Clustered areas of small cortical infarctions in the medial left parietal lobe at the vertex consistent with small infarctions in a left MCA branch vessel territory. No hemorrhage or swelling.   Chronic small vessel ischemic changes elsewhere throughout the brain.   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 This was not primarily or completely evaluated.   No major vessel occlusion. Diffuse intracranial irregularity of the medium size vessels consistent with diffuse atherosclerosis. Severe stenosis of the distal left vertebral artery just proximal to the basilar.    ASSESSMENT/PLAN:  CVA-continue rehabilitation. Continue Plavix Hypertension-well-controlled  CAD-stable Constipation-continue colace  I have reviewed patient's medical records received at  admission/from hospitalization.  CPT CODE: 51884  Gayani Y Dasanayaka, [place] [phone]

## 2014-05-15 NOTE — ED Provider Notes (Signed)
I saw and evaluated the patient, reviewed the resident's note and I agree with the findings and plan.   EKG Interpretation   Date/Time:  Tuesday May 07 2014 15:09:28 EDT Ventricular Rate:  61 PR Interval:  186 QRS Duration: 98 QT Interval:  423 QTC Calculation: 426 R Axis:   59 Text Interpretation:  Age not entered, assumed to be  80 years old for  purpose of ECG interpretation Sinus rhythm ED PHYSICIAN INTERPRETATION  AVAILABLE IN [facility] [place] Confirmed by TEST, Record ([id number]) on  05/09/2014 7:39:26 AM       PT comes in with acute onset bilateral lower ext weakness - to the point where he cant walk. MRI L spine ordered, no acute findings. No upper ext complains, and no focal neuro deficits. Neurology consulted, and they recommend admission and further diagnostic workup.  Varney Biles, MD 05/15/14 [phone]

## 2014-05-17 ENCOUNTER — Encounter: Payer: Self-pay | Admitting: Adult Health

## 2014-05-17 ENCOUNTER — Non-Acute Institutional Stay (SKILLED_NURSING_FACILITY): Payer: Medicare Other | Admitting: Adult Health

## 2014-05-17 DIAGNOSIS — I6350 Cerebral infarction due to unspecified occlusion or stenosis of unspecified cerebral artery: Secondary | ICD-10-CM

## 2014-05-17 DIAGNOSIS — I6389 Other cerebral infarction: Secondary | ICD-10-CM

## 2014-05-17 DIAGNOSIS — I10 Essential (primary) hypertension: Secondary | ICD-10-CM

## 2014-05-17 DIAGNOSIS — K5900 Constipation, unspecified: Secondary | ICD-10-CM

## 2014-05-17 DIAGNOSIS — I2510 Atherosclerotic heart disease of native coronary artery without angina pectoris: Secondary | ICD-10-CM

## 2014-05-17 NOTE — Progress Notes (Signed)
Patient ID: Manuel [name], male   DOB: 07/15/1922, 80 y.o.   MRN: [id number]              PROGRESS NOTE  DATE: 05/17/2014   FACILITY: [facility]  LEVEL OF CARE: SNF (31)  Acute Visit  CHIEF COMPLAINT:  Discharge Notes  HISTORY OF PRESENT ILLNESS: This is an 80 year old male who is for discharge home with Home health PT, OT and Nursing. He has been admitted to [facility] on 05/13/14 from [facility] with Acute CVA. Patient was admitted to this facility for short-term rehabilitation after the patient's recent hospitalization.  Patient has completed SNF rehabilitation and therapy has cleared the patient for discharge.  Reassessment of ongoing problem(s):  HTN: Pt 's HTN remains stable.  Denies CP, sob, DOE, pedal edema, headaches, dizziness or visual disturbances.  No complications from the medications currently being used.  Last BP : 136/60  CAD: The angina has been stable. The patient denies dyspnea on exertion, orthopnea, pedal edema, palpitations and paroxysmal nocturnal dyspnea. No complications noted from the medication presently being used.  CONSTIPATION: The constipation remains stable. No complications from the medications presently being used. Patient denies ongoing constipation, abdominal pain, nausea or vomiting.   PAST MEDICAL HISTORY : Reviewed.  No changes/see problem list  CURRENT MEDICATIONS: Reviewed per MAR/see medication list  REVIEW OF SYSTEMS:  GENERAL: no change in appetite, no fatigue, no weight changes, no fever, chills or weakness RESPIRATORY: no cough, SOB, DOE, wheezing, hemoptysis CARDIAC: no chest pain, edema or palpitations GI: no abdominal pain, diarrhea, constipation, heart burn, nausea or vomiting  PHYSICAL EXAMINATION  GENERAL: no acute distress, normal body habitus EYES: conjunctivae normal, sclerae normal, normal eye lids NECK: supple, trachea midline, no neck masses, no thyroid tenderness, no  thyromegaly LYMPHATICS: no LAN in the neck, no supraclavicular LAN RESPIRATORY: breathing is even & unlabored, BS CTAB CARDIAC: RRR, no murmur,no extra heart sounds, no edema GI: abdomen soft, normal BS, no masses, no tenderness, no hepatomegaly, no splenomegaly EXTREMITIES: able to move all 4 extremities PSYCHIATRIC: the patient is alert & oriented to person, affect & behavior appropriate  LABS/RADIOLOGY:  Labs reviewed: Basic Metabolic Panel:  Recent Labs  01/16/14 1245 05/07/14 1531 05/08/14 0537  NA 137 141 143  K 4.7 4.9 4.5  CL 106 105 106  CO2 26 24 22   GLUCOSE 96 93 96  BUN 21 22 19   CREATININE 1.1 1.05 1.05  CALCIUM 9.3 9.2 9.2   Liver Function Tests:  Recent Labs  05/08/14 0537  AST 16  ALT 11  ALKPHOS 52  BILITOT 0.5  PROT 6.8  ALBUMIN 3.7   CBC:  Recent Labs  08/09/13 2157 05/07/14 1531 05/08/14 0537  WBC 7.8 6.7 6.6  NEUTROABS 4.6  --   --   HGB 13.3 13.8 13.4  HCT 39.2 41.2 40.7  MCV 86.5 87.8 87.7  PLT 210 187 202   Lipid Panel:  Recent Labs  05/09/14 0535  HDL 28*   EXAM: MRI LUMBAR SPINE WITHOUT CONTRAST   TECHNIQUE: Multiplanar, multisequence MR imaging of the lumbar spine was performed. No intravenous contrast was administered.   COMPARISON:  03/10/2012   FINDINGS: Vertebral alignment is normal. Vertebral body heights are preserved without compression fracture. Moderate disc space narrowing and associated degenerative marrow changes are again seen at L4-5. Right-sided pars defect is again noted at L5. 10 mm T1 and T2 hypointense lesion in the L3 vertebral body is unchanged, sclerotic  on prior CT. The conus medullaris is normal in signal and terminates at L1. Small T2 hyperintense lesion arising from the upper pole of the left kidney is unchanged, most likely a cyst.   T12-L1 and L1-2:  Negative.   L2-3: Mild disc bulge and facet and ligamentum flavum hypertrophy without spinal canal or neural foraminal stenosis,  unchanged.   L3-4: Mild disc bulge and facet and ligamentum flavum hypertrophy result in mild spinal stenosis, mild left greater the right lateral recess stenosis, and mild left neural foraminal narrowing, unchanged.   L4-5: Prior laminectomies with decompressed spinal canal. Disc bulging and spurring asymmetric to the right and mild facet and ligamentum flavum hypertrophy result in mild right neural foraminal stenosis, unchanged.   L5-S1: Mild facet hypertrophy result in mild left greater than right neural foraminal narrowing without spinal stenosis, unchanged.   IMPRESSION: Unchanged appearance of the lumbar spine without acute abnormality identified. Moderate multilevel spondylosis resulting in mild spinal canal and lateral recess stenosis at L3-4 and mild right neural foraminal stenosis at L4-5.   EXAM: MRI THORACIC SPINE WITHOUT CONTRAST   TECHNIQUE: Multiplanar, multisequence MR imaging of the thoracic spine was performed. No intravenous contrast was administered.   COMPARISON:  Prior MRI of the lumbar spine performed earlier on the same day.   FINDINGS: The vertebral bodies are normally aligned with preservation of the normal thoracic kyphosis. Vertebral body heights are well preserved. No fracture or listhesis.   Mild multilevel degenerative intervertebral disc space narrowing seen throughout the thoracic spine with associated chronic endplate Schmorl's nodes. No significant degenerative disc bulge identified. No focal disc protrusions. Mild multilevel facet arthropathy noted. No central canal stenosis or cord compression. No significant foraminal narrowing identified.   The visualized paraspinous soft tissues are within normal limits. Visualized lungs are clear.   IMPRESSION: No acute abnormality identified within the thoracic spine. No evidence of cord compression or other intrinsic spinal cord abnormality. EXAM: MRI HEAD WITHOUT CONTRAST   MRA HEAD  WITHOUT CONTRAST   TECHNIQUE: Multiplanar, multiecho pulse sequences of the brain and surrounding structures were obtained without intravenous contrast. Angiographic images of the head were obtained using MRA technique without contrast.   COMPARISON:  Head CT 01/15/2009   FINDINGS: MRI HEAD FINDINGS   Diffusion imaging shows several sub cm foci of acute cortical infarction in the left medial parietal lobe towards the vertex. No large vessel territory infarction. No significant swelling. No hemorrhage.   Elsewhere, there are mild chronic small-vessel changes of the pons. No focal cerebellar insult. There are old small vessel ischemic changes affecting the thalami and the cerebral hemispheric white matter. No mass lesion, hydrocephalus or extra-axial collection. There appears to be a pituitary mass on the left side measuring 11 x 12 mm. This was not primarily or completely evaluated. No skull or skullbase lesion.   MRA HEAD FINDINGS   Both internal carotid arteries are widely patent into the brain. The anterior and middle cerebral vessels are patent but show some diffuse atherosclerotic irregularity. No evidence of correctable stenosis. The right vertebral artery is widely patent to the basilar and is dominant. The nondominant left vertebral artery shows a severe stenosis just proximal to the basilar. No basilar stenosis. Posterior circulation branch vessels are patent but show atherosclerotic irregularity.   IMPRESSION: Clustered areas of small cortical infarctions in the medial left parietal lobe at the vertex consistent with small infarctions in a left MCA branch vessel territory. No hemorrhage or swelling.   Chronic small vessel  ischemic changes elsewhere throughout the brain.   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 This was not primarily or completely evaluated.   No major vessel occlusion. Diffuse intracranial irregularity of  the medium size vessels consistent with diffuse atherosclerosis. Severe stenosis of the distal left vertebral artery just proximal to the basilar. EXAM: MRI HEAD WITHOUT CONTRAST   MRA HEAD WITHOUT CONTRAST   TECHNIQUE: Multiplanar, multiecho pulse sequences of the brain and surrounding structures were obtained without intravenous contrast. Angiographic images of the head were obtained using MRA technique without contrast.   COMPARISON:  Head CT 01/15/2009   FINDINGS: MRI HEAD FINDINGS   Diffusion imaging shows several sub cm foci of acute cortical infarction in the left medial parietal lobe towards the vertex. No large vessel territory infarction. No significant swelling. No hemorrhage.   Elsewhere, there are mild chronic small-vessel changes of the pons. No focal cerebellar insult. There are old small vessel ischemic changes affecting the thalami and the cerebral hemispheric white matter. No mass lesion, hydrocephalus or extra-axial collection. There appears to be a pituitary mass on the left side measuring 11 x 12 mm. This was not primarily or completely evaluated. No skull or skullbase lesion.   MRA HEAD FINDINGS   Both internal carotid arteries are widely patent into the brain. The anterior and middle cerebral vessels are patent but show some diffuse atherosclerotic irregularity. No evidence of correctable stenosis. The right vertebral artery is widely patent to the basilar and is dominant. The nondominant left vertebral artery shows a severe stenosis just proximal to the basilar. No basilar stenosis. Posterior circulation branch vessels are patent but show atherosclerotic irregularity.   IMPRESSION: Clustered areas of small cortical infarctions in the medial left parietal lobe at the vertex consistent with small infarctions in a left MCA branch vessel territory. No hemorrhage or swelling.   Chronic small vessel ischemic changes elsewhere throughout the brain.    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 This was not primarily or completely evaluated.   No major vessel occlusion. Diffuse intracranial irregularity of the medium size vessels consistent with diffuse atherosclerosis. Severe stenosis of the distal left vertebral artery just proximal to the basilar.    ASSESSMENT/PLAN:  CVA - for Home health PT, OT and Nursing Hypertension - well-controlled; continue Lisinopril CAD - stable; continue Coreg Constipation - change Colace 100 mg PO BID   I have filled out patient's discharge paperwork and written prescriptions.  Patient will receive home health PT, OT  And Nursing.   Total discharge time: Less than 30 minutes  Discharge time involved coordination of the discharge process with Education officer, museum, nursing staff and therapy department. Medical justification for home health services verified.  CPT CODE: 48250  Seth Bake - NP [facility] [phone]

## 2014-05-18 ENCOUNTER — Encounter (HOSPITAL_COMMUNITY): Payer: Self-pay | Admitting: *Deleted

## 2014-05-19 DIAGNOSIS — R269 Unspecified abnormalities of gait and mobility: Secondary | ICD-10-CM

## 2014-05-19 DIAGNOSIS — I10 Essential (primary) hypertension: Secondary | ICD-10-CM

## 2014-05-19 DIAGNOSIS — I69998 Other sequelae following unspecified cerebrovascular disease: Secondary | ICD-10-CM

## 2014-05-19 DIAGNOSIS — G609 Hereditary and idiopathic neuropathy, unspecified: Secondary | ICD-10-CM

## 2014-05-19 DIAGNOSIS — I2510 Atherosclerotic heart disease of native coronary artery without angina pectoris: Secondary | ICD-10-CM

## 2014-05-19 DIAGNOSIS — Z48812 Encounter for surgical aftercare following surgery on the circulatory system: Secondary | ICD-10-CM

## 2014-05-21 ENCOUNTER — Telehealth: Payer: Self-pay | Admitting: Cardiovascular Disease

## 2014-05-21 NOTE — Telephone Encounter (Signed)
New message     Pt is home from hosp from a cva---pt bp today is 172/87.  Please advise

## 2014-05-21 NOTE — Telephone Encounter (Signed)
His Bp was normal at his last office visit.  I suspect it was a spurious reading.  If his BP is elevated, try taking again in several minutes. Do not exercise him if SBP is > 200

## 2014-05-21 NOTE — Telephone Encounter (Signed)
Left message for Erin to call me back.

## 2014-05-21 NOTE — Telephone Encounter (Signed)
Spoke with Junie Panning, Physical therapist at [facility] who called to report patient's BP 172/87 today when she went to the home for patient's PT evaluation. Per Junie Panning, patient reports he is taking all medications as directed.  Junie Panning would like Dr. [name] to give parameters for patient's BP and HR so that they know when to call and when patient cannot do PT.  I advised Junie Panning that I will forward message to Dr. [name] and will call back to report his advice.

## 2014-05-22 NOTE — Telephone Encounter (Signed)
Called and reported Dr. Elmarie Shiley order to Kendrick Fries, Midwife at [facility].  Kendrick Fries verbalized read back and understanding.

## 2014-05-27 ENCOUNTER — Ambulatory Visit (INDEPENDENT_AMBULATORY_CARE_PROVIDER_SITE_OTHER): Payer: Medicare Other | Admitting: *Deleted

## 2014-05-27 DIAGNOSIS — I6350 Cerebral infarction due to unspecified occlusion or stenosis of unspecified cerebral artery: Secondary | ICD-10-CM

## 2014-05-27 LAB — MDC_IDC_ENUM_SESS_TYPE_INCLINIC

## 2014-05-27 NOTE — Progress Notes (Signed)
Wound check LINQ in clinic. Battery status good. Normal device function. 145 AF episodes--SR w/PACs. 5.5% of time. Carelink monthly summary reports and ROV in 12 mths w/SK.

## 2014-05-30 ENCOUNTER — Telehealth: Payer: Self-pay | Admitting: Cardiovascular Disease

## 2014-05-30 NOTE — Telephone Encounter (Signed)
Left message as instructed by Illene Regulus Nurse.  Informed on her voicemail that Dr [name] and nurse are both out of the office until next week. Informed that I will route this message to the both of them to further review and advise, and to follow-up thereafter with her.

## 2014-05-30 NOTE — Telephone Encounter (Signed)
New problem   Pt want to know if he can use a salt substitute. Please call Tammy and leave a message if she doesn't answer.

## 2014-06-03 ENCOUNTER — Encounter: Payer: Self-pay | Admitting: Internal Medicine

## 2014-06-03 NOTE — Telephone Encounter (Signed)
Patient may use salt substitute

## 2014-06-03 NOTE — Telephone Encounter (Signed)
Left message on voicemail.

## 2014-06-04 NOTE — Telephone Encounter (Signed)
Left message on patient's voice mail that he may use salt substitute

## 2014-06-12 ENCOUNTER — Ambulatory Visit (INDEPENDENT_AMBULATORY_CARE_PROVIDER_SITE_OTHER): Payer: Medicare Other | Admitting: *Deleted

## 2014-06-12 ENCOUNTER — Encounter: Payer: Self-pay | Admitting: Internal Medicine

## 2014-06-12 DIAGNOSIS — I6350 Cerebral infarction due to unspecified occlusion or stenosis of unspecified cerebral artery: Secondary | ICD-10-CM

## 2014-06-12 LAB — MDC_IDC_ENUM_SESS_TYPE_REMOTE: Date Time Interrogation Session: 20150819040500

## 2014-06-14 NOTE — Progress Notes (Signed)
Loop recorder 

## 2014-06-20 ENCOUNTER — Encounter: Payer: Self-pay | Admitting: Internal Medicine

## 2014-06-24 ENCOUNTER — Encounter: Payer: Self-pay | Admitting: Internal Medicine

## 2014-06-25 ENCOUNTER — Encounter: Payer: Self-pay | Admitting: Internal Medicine

## 2014-07-02 ENCOUNTER — Encounter: Payer: Self-pay | Admitting: Internal Medicine

## 2014-07-04 ENCOUNTER — Encounter: Payer: Self-pay | Admitting: Internal Medicine

## 2014-07-05 ENCOUNTER — Encounter: Payer: Self-pay | Admitting: Internal Medicine

## 2014-07-11 ENCOUNTER — Telehealth: Payer: Self-pay | Admitting: Cardiovascular Disease

## 2014-07-11 NOTE — Telephone Encounter (Signed)
Spoke with patient's wife who states patient has a headache today and she is concerned because BP is 167/90.  Wife states she does not know patient's heart rate.  I advised her to take another BP and to note the heart rate as patient does not need to take additional Coreg if pulse is too low.  Wife reports repeat BP 154/68, HR 57 bpm.  I advised wife for patient to take something for headache and to monitor for s/s of stroke.  I reviewed these s/s with patient and she reports that patient does not have any of these presently.  I advised her to call 911 if patient develops any facial drooping, extremity weakness or numbness, difficulty walking or talking.  I advised her to continue to monitor BP and if problem arises to call back.  Patient's wife verbalized understanding and agreement.

## 2014-07-11 NOTE — Telephone Encounter (Signed)
New message     Pt has a severe headache and his bp is 162/90.  Can he take more bp medication?

## 2014-07-12 ENCOUNTER — Ambulatory Visit (INDEPENDENT_AMBULATORY_CARE_PROVIDER_SITE_OTHER): Payer: Medicare Other | Admitting: *Deleted

## 2014-07-12 ENCOUNTER — Encounter: Payer: Self-pay | Admitting: Internal Medicine

## 2014-07-12 DIAGNOSIS — I6350 Cerebral infarction due to unspecified occlusion or stenosis of unspecified cerebral artery: Secondary | ICD-10-CM

## 2014-07-12 LAB — MDC_IDC_ENUM_SESS_TYPE_REMOTE: Date Time Interrogation Session: 20150918040500

## 2014-07-16 NOTE — Progress Notes (Signed)
Loop recorder 

## 2014-08-09 ENCOUNTER — Encounter: Payer: Self-pay | Admitting: Internal Medicine

## 2014-08-12 ENCOUNTER — Ambulatory Visit (INDEPENDENT_AMBULATORY_CARE_PROVIDER_SITE_OTHER): Payer: Medicare Other | Admitting: *Deleted

## 2014-08-12 ENCOUNTER — Encounter: Payer: Self-pay | Admitting: Internal Medicine

## 2014-08-12 DIAGNOSIS — I639 Cerebral infarction, unspecified: Secondary | ICD-10-CM

## 2014-08-13 ENCOUNTER — Encounter: Payer: Self-pay | Admitting: Internal Medicine

## 2014-08-13 ENCOUNTER — Encounter: Payer: Self-pay | Admitting: Cardiovascular Disease

## 2014-08-13 ENCOUNTER — Ambulatory Visit (INDEPENDENT_AMBULATORY_CARE_PROVIDER_SITE_OTHER): Payer: Medicare Other | Admitting: Cardiovascular Disease

## 2014-08-13 VITALS — BP 124/60 | HR 72 | Ht 74.0 in | Wt 194.2 lb

## 2014-08-13 DIAGNOSIS — I639 Cerebral infarction, unspecified: Secondary | ICD-10-CM

## 2014-08-13 DIAGNOSIS — I480 Paroxysmal atrial fibrillation: Secondary | ICD-10-CM

## 2014-08-13 DIAGNOSIS — Z7901 Long term (current) use of anticoagulants: Secondary | ICD-10-CM

## 2014-08-13 DIAGNOSIS — I4891 Unspecified atrial fibrillation: Secondary | ICD-10-CM | POA: Insufficient documentation

## 2014-08-13 LAB — BASIC METABOLIC PANEL
BUN: 23 mg/dL (ref 6–23)
CHLORIDE: 107 meq/L (ref 96–112)
CO2: 19 meq/L (ref 19–32)
Calcium: 9 mg/dL (ref 8.4–10.5)
Creatinine, Ser: 1.1 mg/dL (ref 0.4–1.5)
GFR: 64.79 mL/min (ref >60.00)
Glucose, Bld: 154 mg/dL — ABNORMAL HIGH (ref 70–99)
POTASSIUM: 3.9 meq/L (ref 3.5–5.1)
Sodium: 140 mEq/L (ref 135–145)

## 2014-08-13 LAB — CBC WITH DIFFERENTIAL/PLATELET
BASOS PCT: 0.3 % (ref 0.0–3.0)
Basophils Absolute: 0 10*3/uL (ref 0.0–0.1)
EOS PCT: 2.5 % (ref 0.0–5.0)
Eosinophils Absolute: 0.2 10*3/uL (ref 0.0–0.7)
HCT: 40 % (ref 39.0–52.0)
Hemoglobin: 13.3 g/dL (ref 13.0–17.0)
LYMPHS PCT: 25.4 % (ref 12.0–46.0)
Lymphs Abs: 1.6 10*3/uL (ref 0.7–4.0)
MCHC: 33.3 g/dL (ref 30.0–36.0)
MCV: 87.2 fl (ref 78.0–100.0)
MONOS PCT: 9.7 % (ref 3.0–12.0)
Monocytes Absolute: 0.6 10*3/uL (ref 0.1–1.0)
NEUTROS PCT: 62.1 % (ref 43.0–77.0)
Neutro Abs: 3.9 10*3/uL (ref 1.4–7.7)
Platelets: 199 10*3/uL (ref 150.0–400.0)
RBC: 4.59 Mil/uL (ref 4.22–5.81)
RDW: 14.1 % (ref 11.5–15.5)
WBC: 6.2 10*3/uL (ref 4.0–10.5)

## 2014-08-13 MED ORDER — APIXABAN 5 MG PO TABS: 5.0000 mg | ORAL_TABLET | Freq: Two times a day (BID) | ORAL | Status: DC

## 2014-08-13 NOTE — Assessment & Plan Note (Signed)
.  Presents for followup evaluation. He has a history of stroke. He was recently found to have paroxysmal atrial fibrillation on his implantable loop recorder. I think that this probably explains his stroke. At this point we'll discontinue the Plavix and start him on Eliquis 5 mg BID ( age is over 82 but weight and creatinine are ok and would not lower the dose of Eliquis)   Will have him see Pharmacy for a NOAC visit in several weeks.   He is otherwise doing well.

## 2014-08-13 NOTE — Patient Instructions (Addendum)
Your physician has recommended you make the following change in your medication:  STOP Plavix START Eliquis 5 mg twice daily  Your physician wants you to follow-up in: 6 months with Dr. [name].  You will receive a reminder letter in the mail two months in advance. If you don't receive a letter, please call our office to schedule the follow-up appointment.  Your physician recommends that you have lab work:  TODAY - CBC/BMET  Your physician recommends that you return for lab work in: 1 month for a visit/lab work to check on you with Eliquis

## 2014-08-13 NOTE — Progress Notes (Addendum)
Manuel [name] Date of Birth  [birth year]       [place] [address], [address], [place], [place]  [address]   [place], [place]  [address] [phone]     [phone]   Fax  [phone]    Fax [phone]  Problem List: 1. Hypertension 2. Mallory-Weiss tear 3. Peripheral neuropathy -  4. Paroxysmal atrial fibrillation  History of Present Illness:  Manuel [name] is an 80 yo who I have not seen in the past 2 years or so.  He has had some atypical chest pain but none recently.   We is now using a scooter to get around.  He is quite please with his Amigo scooter.   January 23, 2013:   Manuel [name] complains of generalized fatigue.  He is sleepy.  He checks his BP frequently.   March 25, Manuel: Manuel [name] is doing OK.  He has generalized weakness most of his left leg. He otherwise feels good for someone who is 80 years old.  May 8, Manuel:  Manuel [name], Manuel:  [name]  is doing fairly well. We recently received information from Dr. Caryl Comes  stating that he had significant amount of atrial fibrillation. Manuel [name]  has a history of stroke. We had him come to his office visit several weeks early to discuss this issue. He's doing fairly well. He uses his motorized scooter to get around. He is slightly unsteady on his feet but has not had any history of falling.  Current Outpatient Prescriptions on File Prior to Visit  Medication Sig Dispense Refill  . acetaminophen (TYLENOL) 650 MG CR tablet Take 650 mg by mouth every 8 (eight) hours as needed for pain (give with docusate for stool softner).      . carvedilol (COREG) 3.125 MG tablet Take 1 tablet (3.125 mg total) by mouth 2 (two) times daily.  60 tablet  6  . Cholecalciferol (VITAMIN D) 2000 UNITS tablet Take 2,000 Units by mouth daily.      . clopidogrel (PLAVIX) 75 MG tablet Take 1 tablet (75 mg total) by mouth daily.      . Coenzyme Q10 (COQ10) 200 MG CAPS Take 200 mg by mouth daily.      Marland Kitchen docusate  sodium (COLACE) 100 MG capsule Take 100 mg by mouth daily as needed for mild constipation (give with tylenol arthritis for a stool softner).      Marland Kitchen lisinopril (PRINIVIL,ZESTRIL) 20 MG tablet Take 1 tablet (20 mg total) by mouth daily. TAKE 1 TABLET BY MOUTH DAILY.  30 tablet  11  . Multiple Vitamin (MULTIVITAMIN WITH MINERALS) TABS tablet Take 1 tablet by mouth daily. NutraLite      . OMEGA 3-6-9 FATTY ACIDS PO Take 1 capsule by mouth daily.      . vitamin C (ASCORBIC ACID) 500 MG tablet Take 250 mg by mouth at bedtime.       . vitamin E 400 UNIT capsule Take 400 Units by mouth daily.       No current facility-administered medications on file prior to visit.    Allergies  Allergen Reactions  . Adhesive [Tape]     Plastic tape= tears skin  . Codeine Other (See Comments)    Pass out  . Ivp Dye [Iodinated Diagnostic Agents] Other (See Comments)    Doesn't remember reaction but it was severe  . Penicillins Hives and Itching  . Rofecoxib Other (See Comments)  elevated BP (Vioxx)  . Sulfonamide Derivatives Hives and Itching    Past Medical History  Diagnosis Date  . Hypertension   . Rectal bleed   . Mallory-Weiss tear 2011    treated with 6 units PRBCs. No longer on aspirin  . Carotid artery occlusion   . Zenker diverticulum   . Colon polyps   . Gastric hemorrhage   . CAD (coronary artery disease)   . Hearing loss   . Peripheral neuropathy   . Stroke     Past Surgical History  Procedure Laterality Date  . Back surgery    . Appendectomy    . Abdominal surgery    . Hernia repair    . Loop recorder implant  05/13/14    MDT LINQ implanted by Dr Caryl Comes for cryptogenic stroke    History  Smoking status  . Never Smoker   Smokeless tobacco  . Never Used    History  Alcohol Use No    Family History  Problem Relation Age of Onset  . Cancer Mother     Deceased, 89  . Heart attack Father     Deceased, 72  . Heart disease Sister     Heart Disease before age 33  .  Diabetes Son   . Parkinson's disease Sister   . Stroke Other     Deceased, siblings  . Heart disease Other     Deceased, siblings    Reviw of Systems:  Reviewed in the HPI.  All other systems are negative.  Physical Exam: Blood pressure 124/60, pulse 72, height 6\' 2"  (1.88 m), weight 194 lb 3.2 oz (88.089 kg). General: Well developed, well nourished, in no acute distress.  Head: Normocephalic, atraumatic, sclera non-icteric, mucus membranes are moist,   Neck: Supple. Carotids are 2 + without bruits. No JVD  Lungs: Clear bilaterally to auscultation.  Heart: regular rate. With frequent premature beats.    normal  S1 S2. No murmurs, gallops or rubs.   Abdomen: Soft, non-tender, non-distended with normal bowel sounds. No hepatomegaly. No rebound/guarding. No masses.  Msk:  Strength and tone are normal  Extremities: No clubbing or cyanosis. No edema.  Distal pedal pulses are 2+ and equal bilaterally.  Neuro: Alert and oriented X 3. Moves all extremities spontaneously.  Psych:  Responds to questions appropriately with a normal affect.  ECG: Oct. 20, Manuel:  NSR with sinus arrhythmias, NS ST abn.    Assessment / Plan:

## 2014-08-20 LAB — MDC_IDC_ENUM_SESS_TYPE_REMOTE: MDC IDC SESS DTM: 20151019040500

## 2014-08-21 ENCOUNTER — Encounter: Payer: Self-pay | Admitting: Internal Medicine

## 2014-08-23 NOTE — Progress Notes (Signed)
Loop recorder 

## 2014-09-03 ENCOUNTER — Encounter: Payer: Self-pay | Admitting: Internal Medicine

## 2014-09-10 ENCOUNTER — Ambulatory Visit (INDEPENDENT_AMBULATORY_CARE_PROVIDER_SITE_OTHER): Payer: Medicare Other | Admitting: *Deleted

## 2014-09-10 ENCOUNTER — Encounter: Payer: Self-pay | Admitting: Internal Medicine

## 2014-09-10 DIAGNOSIS — I639 Cerebral infarction, unspecified: Secondary | ICD-10-CM

## 2014-09-10 LAB — MDC_IDC_ENUM_SESS_TYPE_REMOTE: Date Time Interrogation Session: 20151117050500

## 2014-09-13 ENCOUNTER — Ambulatory Visit (INDEPENDENT_AMBULATORY_CARE_PROVIDER_SITE_OTHER): Payer: Medicare Other | Admitting: *Deleted

## 2014-09-13 DIAGNOSIS — I4891 Unspecified atrial fibrillation: Secondary | ICD-10-CM

## 2014-09-13 LAB — CBC
HEMATOCRIT: 40.2 % (ref 39.0–52.0)
HEMOGLOBIN: 13.2 g/dL (ref 13.0–17.0)
MCHC: 32.9 g/dL (ref 30.0–36.0)
MCV: 87.3 fl (ref 78.0–100.0)
PLATELETS: 195 10*3/uL (ref 150.0–400.0)
RBC: 4.6 Mil/uL (ref 4.22–5.81)
RDW: 13.9 % (ref 11.5–15.5)
WBC: 6.7 10*3/uL (ref 4.0–10.5)

## 2014-09-13 LAB — BASIC METABOLIC PANEL
BUN: 24 mg/dL — AB (ref 6–23)
CALCIUM: 9 mg/dL (ref 8.4–10.5)
CO2: 25 meq/L (ref 19–32)
CREATININE: 1.1 mg/dL (ref 0.4–1.5)
Chloride: 108 mEq/L (ref 96–112)
GFR: 65.45 mL/min (ref >60.00)
GLUCOSE: 104 mg/dL — AB (ref 70–99)
Potassium: 4.6 mEq/L (ref 3.5–5.1)
Sodium: 138 mEq/L (ref 135–145)

## 2014-09-13 NOTE — Progress Notes (Signed)
Pt was started on Eliquis for Afib on 08/13/14.    Reviewed patients medication list.  Pt is not  currently on any combined P-gp and strong CYP3A4 inhibitors/inducers (ketoconazole, traconazole, ritonavir, carbamazepine, phenytoin, rifampin, St. John's wort).  Reviewed labs.  SCr 1.1, Weight 87.8Kg , Age 80 yrs.  Dose appropriate based on specified criteria.  Hgb and HCT 13.2/40.2  A full discussion of the nature of anticoagulants has been carried out.  A benefit/risk analysis has been presented to the patient, so that they understand the justification for choosing anticoagulation with Eliquis at this time.  The need for compliance is stressed.  Pt is aware to take the medication twice daily.  Side effects of potential bleeding are discussed, including unusual colored urine or stools, coughing up blood or coffee ground emesis, nose bleeds or serious fall or head trauma.  Discussed signs and symptoms of stroke. The patient should avoid any OTC items containing aspirin or ibuprofen.  Avoid alcohol consumption.   Call if any signs of abnormal bleeding.  Discussed financial obligations and resolved any difficulty in obtaining medication.  Next lab test test in 6 months on 03/14/15.  Telephoned pt's spouse and made her aware of the lab results and that we were going to continue him on Eliquis 5mg s BID.

## 2014-09-17 NOTE — Progress Notes (Signed)
Loop recorder 

## 2014-09-27 ENCOUNTER — Encounter: Payer: Self-pay | Admitting: Internal Medicine

## 2014-10-03 ENCOUNTER — Encounter (HOSPITAL_COMMUNITY): Payer: Self-pay | Admitting: Internal Medicine

## 2014-10-10 ENCOUNTER — Ambulatory Visit (INDEPENDENT_AMBULATORY_CARE_PROVIDER_SITE_OTHER): Payer: Medicare Other | Admitting: *Deleted

## 2014-10-10 ENCOUNTER — Encounter: Payer: Self-pay | Admitting: Internal Medicine

## 2014-10-10 DIAGNOSIS — I639 Cerebral infarction, unspecified: Secondary | ICD-10-CM

## 2014-10-11 NOTE — Progress Notes (Signed)
Loop recorder 

## 2014-10-22 ENCOUNTER — Encounter: Payer: Self-pay | Admitting: Internal Medicine

## 2014-10-23 ENCOUNTER — Other Ambulatory Visit: Payer: Self-pay | Admitting: Family Medicine

## 2014-10-23 ENCOUNTER — Encounter: Payer: Self-pay | Admitting: Internal Medicine

## 2014-10-23 ENCOUNTER — Ambulatory Visit
Admission: RE | Admit: 2014-10-23 | Discharge: 2014-10-23 | Disposition: A | Discharge status: Home / Self Care | Payer: Medicare Other | Source: Ambulatory Visit | Attending: Family Medicine | Admitting: Family Medicine

## 2014-10-23 DIAGNOSIS — J40 Bronchitis, not specified as acute or chronic: Secondary | ICD-10-CM

## 2014-10-24 ENCOUNTER — Encounter: Payer: Self-pay | Admitting: Internal Medicine

## 2014-10-28 ENCOUNTER — Encounter: Payer: Self-pay | Admitting: Internal Medicine

## 2014-11-08 ENCOUNTER — Encounter: Payer: Self-pay | Admitting: Internal Medicine

## 2014-11-08 ENCOUNTER — Ambulatory Visit (INDEPENDENT_AMBULATORY_CARE_PROVIDER_SITE_OTHER): Payer: Medicare Other | Admitting: *Deleted

## 2014-11-08 DIAGNOSIS — I639 Cerebral infarction, unspecified: Secondary | ICD-10-CM

## 2014-11-11 ENCOUNTER — Other Ambulatory Visit: Payer: Self-pay | Admitting: *Deleted

## 2014-11-11 ENCOUNTER — Telehealth: Payer: Self-pay | Admitting: Cardiovascular Disease

## 2014-11-11 MED ORDER — CARVEDILOL 3.125 MG PO TABS: 3.1250 mg | ORAL_TABLET | Freq: Two times a day (BID) | ORAL | Status: DC

## 2014-11-11 NOTE — Telephone Encounter (Signed)
Wife calls today b/c pt 1)  bp " has been all over the place" 2) headache all day without relief from ES tylenol for the past week to 10 days 3) fatigue for over a week  Wife states pt has been getting over a cold. States he has finished prescribed antibiotics. Still productive cough but better than last week. Color of mucus is lighter less yellow. No fever, no nausea, no angina, no chest pain.  Wife is concerned about blood pressure. BP today:  10am          113/71                   1:20pm       102/71                   1:30pm       138/79                   1:50pm       183/85    Pt had been up moving around  No dizziness or lightheadedness noted  We discussed dehydration & nutrition as well as frequency of blood pressure checks Pt is taking all medications.  She states pt is not eating or drinking as he normally does. Fatigued after being up only 3-4 hours during the day.    We discussed signs/symptoms of stroke. Agrees with nutritional supplement ( he has taken them before). She will contact pcp regarding unrelieved headache.  Will forward to Dr. [name] for review about blood pressure readings.

## 2014-11-11 NOTE — Telephone Encounter (Signed)
New Msg        Pt c/o BP issue:  1. What are your last 5 BP readings? Today 113/71/, 102/71, 138/79 with pulse of 85 at each reading. 2. Are you having any other symptoms (ex. Dizziness, headache, blurred vision, passed out)? Severe Headache, light headed. 3. What is your medication issue? Pt would like to know if pt should have additional BP medication to lower BP  Please contact pt wife in regards to BP concerns

## 2014-11-12 LAB — MDC_IDC_ENUM_SESS_TYPE_REMOTE: Date Time Interrogation Session: 20151214050500

## 2014-11-12 NOTE — Telephone Encounter (Signed)
Spoke with patient who states he is feeling better today; states he has an appointment later today with his PCP.  I advised patient to continue to monitor BP/pulse after he has recovered from the viral URI and to notify our office if he continues to notice fluctuations in BP and/or heart rate that are concerning to him.  Patient verbalized understanding and agreement.

## 2014-11-12 NOTE — Telephone Encounter (Signed)
It sounds like he still has a viral URI. It would not be unusual to have variable BP during an illness. I would wait until after he is better then decide whether or not to adjust BP meds.

## 2014-11-13 NOTE — Progress Notes (Signed)
Loop recorder 

## 2014-11-16 LAB — MDC_IDC_ENUM_SESS_TYPE_REMOTE

## 2014-11-21 ENCOUNTER — Encounter: Payer: Self-pay | Admitting: Internal Medicine

## 2014-11-22 ENCOUNTER — Encounter: Payer: Self-pay | Admitting: Internal Medicine

## 2014-12-03 ENCOUNTER — Encounter: Payer: Self-pay | Admitting: Internal Medicine

## 2014-12-09 ENCOUNTER — Ambulatory Visit (INDEPENDENT_AMBULATORY_CARE_PROVIDER_SITE_OTHER): Payer: Medicare Other | Admitting: *Deleted

## 2014-12-09 ENCOUNTER — Encounter: Payer: Self-pay | Admitting: Internal Medicine

## 2014-12-09 DIAGNOSIS — I639 Cerebral infarction, unspecified: Secondary | ICD-10-CM

## 2014-12-09 LAB — MDC_IDC_ENUM_SESS_TYPE_REMOTE

## 2014-12-11 NOTE — Progress Notes (Signed)
Loop recorder 

## 2014-12-18 ENCOUNTER — Encounter: Payer: Self-pay | Admitting: Family

## 2014-12-19 ENCOUNTER — Encounter: Payer: Self-pay | Admitting: Family

## 2014-12-19 ENCOUNTER — Ambulatory Visit (HOSPITAL_COMMUNITY)
Admission: RE | Admit: 2014-12-19 | Discharge: 2014-12-19 | Disposition: A | Discharge status: Home / Self Care | Payer: Medicare Other | Source: Ambulatory Visit | Attending: Family | Admitting: Family

## 2014-12-19 ENCOUNTER — Ambulatory Visit (INDEPENDENT_AMBULATORY_CARE_PROVIDER_SITE_OTHER): Payer: Medicare Other | Admitting: Family

## 2014-12-19 VITALS — BP 145/76 | HR 66 | Resp 16 | Ht 73.0 in | Wt 192.0 lb

## 2014-12-19 DIAGNOSIS — I739 Peripheral vascular disease, unspecified: Secondary | ICD-10-CM | POA: Insufficient documentation

## 2014-12-19 DIAGNOSIS — I480 Paroxysmal atrial fibrillation: Secondary | ICD-10-CM | POA: Diagnosis not present

## 2014-12-19 DIAGNOSIS — Z8673 Personal history of transient ischemic attack (TIA), and cerebral infarction without residual deficits: Secondary | ICD-10-CM

## 2014-12-19 DIAGNOSIS — I6523 Occlusion and stenosis of bilateral carotid arteries: Secondary | ICD-10-CM

## 2014-12-19 NOTE — Progress Notes (Signed)
[facility] [place] HISTORY AND PHYSICAL   MRN : [id number]  History of Present Illness:   Manuel [name] is a 81 y.o. male patient of Dr. Scot Dock who presents for follow up of known ICA stenosis.   He has had a neurological evaluation of his lumbar spine issues and left foot drop, but states no help could be offered.   He had a TIA July, 2015, as manifested by right leg weakness, started Eliquis, Dr. [name]. Dr. Elmarie Shiley note dated 08/13/14: He was recently found to have paroxysmal atrial fibrillation on his implantable loop recorder. I think that this probably explains his stroke. At this point we'll discontinue the Plavix and start him on Eliquis  He had a cardiac work up.  He expresses discouragement at not being able to walk much due to foot drop, low back pain, and sciatic type pain affecting legs.   Patient reports New Medical or Surgical History: URI December 2015, treated.   Pt Diabetic: No  Pt smoker: non-smoker   Pt meds include:  Statin : No: has never had elevated cholesterol  ASA: No: had GI bleed and was stopped  Other anticoagulants/antiplatelets: on Eliquis since his TIA July 2015    Current Outpatient Prescriptions  Medication Sig Dispense Refill  . acetaminophen (TYLENOL) 650 MG CR tablet Take 650 mg by mouth every 8 (eight) hours as needed for pain (give with docusate for stool softner).    Marland Kitchen apixaban (ELIQUIS) 5 MG TABS tablet Take 1 tablet (5 mg total) by mouth 2 (two) times daily. 62 tablet 11  . carvedilol (COREG) 3.125 MG tablet Take 1 tablet (3.125 mg total) by mouth 2 (two) times daily. 60 tablet 6  . Cholecalciferol (VITAMIN D) 2000 UNITS tablet Take 2,000 Units by mouth daily.    . Coenzyme Q10 (COQ10) 200 MG CAPS Take 200 mg by mouth daily.    Marland Kitchen docusate sodium (COLACE) 100 MG capsule Take 100 mg by mouth daily as needed for mild constipation (give with tylenol arthritis for a stool softner).    Marland Kitchen lisinopril  (PRINIVIL,ZESTRIL) 20 MG tablet Take 1 tablet (20 mg total) by mouth daily. TAKE 1 TABLET BY MOUTH DAILY. 30 tablet 11  . Multiple Vitamin (MULTIVITAMIN WITH MINERALS) TABS tablet Take 1 tablet by mouth daily. NutraLite    . OMEGA 3-6-9 FATTY ACIDS PO Take 1 capsule by mouth daily.    Marland Kitchen omeprazole (PRILOSEC) 40 MG capsule Take 40 mg by mouth at bedtime.     . vitamin C (ASCORBIC ACID) 500 MG tablet Take 250 mg by mouth at bedtime.     . vitamin E 400 UNIT capsule Take 400 Units by mouth daily.     No current facility-administered medications for this visit.    Past Medical History  Diagnosis Date  . Hypertension   . Rectal bleed   . Mallory-Weiss tear 2011    treated with 6 units PRBCs. No longer on aspirin  . Carotid artery occlusion   . Zenker diverticulum   . Colon polyps   . Gastric hemorrhage   . CAD (coronary artery disease)   . Hearing loss   . Peripheral neuropathy   . Stroke     Social History History  Substance Use Topics  . Smoking status: Never Smoker   . Smokeless tobacco: Never Used  . Alcohol Use: No    Family History Family History  Problem Relation Age of Onset  . Cancer Mother  Deceased, 28  . Heart attack Father     Deceased, 61  . Heart disease Sister     Heart Disease before age 63  . Diabetes Son   . Parkinson's disease Sister   . Stroke Other     Deceased, siblings  . Heart disease Other     Deceased, siblings    Surgical History Past Surgical History  Procedure Laterality Date  . Back surgery    . Appendectomy    . Abdominal surgery    . Hernia repair    . Loop recorder implant  05/13/14    MDT LINQ implanted by Dr Caryl Comes for cryptogenic stroke  . Loop recorder implant N/A 05/13/2014    Procedure: LOOP RECORDER IMPLANT;  Surgeon: Deboraha Sprang, MD;  Location: [facility] CATH LAB;  Service: Cardiovascular;  Laterality: N/A;    Allergies  Allergen Reactions  . Adhesive [Tape]     Plastic tape= tears skin  . Codeine Other (See  Comments)    Pass out  . Ivp Dye [Iodinated Diagnostic Agents] Other (See Comments)    Doesn't remember reaction but it was severe  . Penicillins Hives and Itching  . Rofecoxib Other (See Comments)     elevated BP (Vioxx)  . Sulfonamide Derivatives Hives and Itching    Current Outpatient Prescriptions  Medication Sig Dispense Refill  . acetaminophen (TYLENOL) 650 MG CR tablet Take 650 mg by mouth every 8 (eight) hours as needed for pain (give with docusate for stool softner).    Marland Kitchen apixaban (ELIQUIS) 5 MG TABS tablet Take 1 tablet (5 mg total) by mouth 2 (two) times daily. 62 tablet 11  . carvedilol (COREG) 3.125 MG tablet Take 1 tablet (3.125 mg total) by mouth 2 (two) times daily. 60 tablet 6  . Cholecalciferol (VITAMIN D) 2000 UNITS tablet Take 2,000 Units by mouth daily.    . Coenzyme Q10 (COQ10) 200 MG CAPS Take 200 mg by mouth daily.    Marland Kitchen docusate sodium (COLACE) 100 MG capsule Take 100 mg by mouth daily as needed for mild constipation (give with tylenol arthritis for a stool softner).    Marland Kitchen lisinopril (PRINIVIL,ZESTRIL) 20 MG tablet Take 1 tablet (20 mg total) by mouth daily. TAKE 1 TABLET BY MOUTH DAILY. 30 tablet 11  . Multiple Vitamin (MULTIVITAMIN WITH MINERALS) TABS tablet Take 1 tablet by mouth daily. NutraLite    . OMEGA 3-6-9 FATTY ACIDS PO Take 1 capsule by mouth daily.    Marland Kitchen omeprazole (PRILOSEC) 40 MG capsule Take 40 mg by mouth at bedtime.     . vitamin C (ASCORBIC ACID) 500 MG tablet Take 250 mg by mouth at bedtime.     . vitamin E 400 UNIT capsule Take 400 Units by mouth daily.     No current facility-administered medications for this visit.     REVIEW OF SYSTEMS: See HPI for pertinent positives and negatives.  Physical Examination  Filed Vitals:   12/19/14 1206 12/19/14 1210  BP: 142/74 145/76  Pulse: 64 66  Resp:  16  Height:  6\' 1"  (1.854 m)  Weight:  192 lb (87.091 kg)  SpO2:  96%   Body mass index is 25.34 kg/(m^2).   General: WDWN male in NAD   GAIT: left foot drop  Eyes: PERRLA  Pulmonary: Non-labored, CTAB, Negative Rales, Negative rhonchi, & Negative wheezing.  Cardiac: Irregular Rhythm   VASCULAR EXAM  Carotid Bruits  Left  Right    Negative  Negative   Aorta  is not palpable.  Radial pulses are 2+ palpable and equal.  LE Pulses  LEFT  RIGHT   FEMORAL  palpable  palpable   POPLITEAL  not palpable  not palpable   POSTERIOR TIBIAL  not palpable  not palpable   DORSALIS PEDIS  ANTERIOR TIBIAL  palpable  Not palpable   Gastrointestinal: soft, nontender, BS WNL, no r/g, no palpable masses.  Musculoskeletal: Negative muscle atrophy/wasting. M/S 4/5 in arms, 3/5 in legs, Extremities without ischemic changes. He does have 2+ pretibial pitting edema in the right lower leg and 1+ in the left lower leg. Both lower legs have hemosiderin deposits, no ulcers. Soles of feet are slightly ruddy, lateral aspects of the dorsal surface of feet are slightly dusky.   Neurologic: A&O X 3; Appropriate Affect ; SENSATION ;normal;  Speech is normal  CN 2-12 intact except for some hearing loss, Pain and light touch intact in extremities, Motor exam as listed above. Right foot drop with walking.          Non-Invasive Vascular Imaging (12/19/2014):   CEREBROVASCULAR DUPLEX EVALUATION    INDICATION: Carotid disease    PREVIOUS INTERVENTION(S):     DUPLEX EXAM:     RIGHT  LEFT  Peak Systolic Velocities (cm/s) End Diastolic Velocities (cm/s) Plaque LOCATION Peak Systolic Velocities (cm/s) End Diastolic Velocities (cm/s) Plaque  94 7  CCA PROXIMAL 91 9   87 9  CCA MID 109 10   96 8 HT CCA DISTAL 99 13 HT  198 0 HT ECA 179 0 HT  228 43 HT ICA PROXIMAL 145 26 HT  162 18  ICA MID 100 23   94 13  ICA DISTAL 65 17     2.4 ICA / CCA Ratio (PSV) 1.5  Antegrade Vertebral Flow Antegrade   Brachial Systolic Pressure (mmHg)   Multiphasic (subclavian artery) Brachial Artery Waveforms Multiphasic (subclavian  artery)    Plaque Morphology:  HM = Homogeneous, HT = Heterogeneous, CP = Calcific Plaque, SP = Smooth Plaque, IP = Irregular Plaque     ADDITIONAL FINDINGS: No significant stenosis of the bilateral external or common carotid arteries.    IMPRESSION: Doppler velocities suggest a 40-59% stenosis of the right proximal internal carotid artery and a less than 40% stenosis of the left proximal internal carotid artery.    Compared to the previous exam:  No significant change noted when compared to the previous exam on 12/12/13.       ASSESSMENT:  Manuel [name] is a 81 y.o. male who had a stroke in July 2015, thought by his cardiologist to be due to recent paroxysmal atrial fibrillation. Today's carotid Duplex reveals a 40-59% stenosis of the right proximal internal carotid artery and a less than 40% stenosis of the left proximal internal carotid artery and a less than 40% stenosis of the left proximal internal carotid artery. No significant change noted when compared to the previous exam on 12/12/13.   Face to face time with patient was 25 minutes. Over 50% of this time was spent on counseling and coordination of care.    PLAN:   Based on today's exam and non-invasive vascular lab results, the patient will follow up in 1 year  with the following tests carotid Duplex. I discussed in depth with the patient the nature of atherosclerosis, and emphasized the importance of maximal medical management including strict control of blood pressure, blood glucose, and lipid levels, obtaining regular exercise, and continued cessation of smoking.  The patient  is aware that without maximal medical management the underlying atherosclerotic disease process will progress, limiting the benefit of any interventions.  The patient was given information about stroke prevention and what symptoms should prompt the patient to seek immediate medical care.  The patient was given information about PAD including signs,  symptoms, treatment, what symptoms should prompt the patient to seek immediate medical care, and risk reduction measures to take. Thank you for allowing [place] to participate in this patient's care.  Clemon Chambers, RN, MSN, FNP-C [facility] Office: [phone]  Clinic MD: [facility] 12/19/2014 11:59 AM

## 2014-12-19 NOTE — Patient Instructions (Signed)

## 2015-01-08 ENCOUNTER — Encounter: Payer: Self-pay | Admitting: Internal Medicine

## 2015-01-08 ENCOUNTER — Ambulatory Visit (INDEPENDENT_AMBULATORY_CARE_PROVIDER_SITE_OTHER): Payer: Medicare Other | Admitting: *Deleted

## 2015-01-08 DIAGNOSIS — I639 Cerebral infarction, unspecified: Secondary | ICD-10-CM

## 2015-01-10 NOTE — Progress Notes (Signed)
Loop recorder 

## 2015-01-21 ENCOUNTER — Other Ambulatory Visit: Payer: Self-pay | Admitting: *Deleted

## 2015-01-22 LAB — MDC_IDC_ENUM_SESS_TYPE_REMOTE

## 2015-01-23 ENCOUNTER — Other Ambulatory Visit: Payer: Self-pay | Admitting: *Deleted

## 2015-01-23 MED ORDER — LISINOPRIL 20 MG PO TABS: 20.0000 mg | ORAL_TABLET | Freq: Every day | ORAL | Status: DC

## 2015-01-31 ENCOUNTER — Encounter: Payer: Self-pay | Admitting: Cardiology

## 2015-02-06 ENCOUNTER — Encounter: Payer: Self-pay | Admitting: Cardiology

## 2015-02-07 ENCOUNTER — Ambulatory Visit (INDEPENDENT_AMBULATORY_CARE_PROVIDER_SITE_OTHER): Payer: Medicare Other | Admitting: *Deleted

## 2015-02-07 ENCOUNTER — Encounter: Payer: Self-pay | Admitting: Internal Medicine

## 2015-02-07 DIAGNOSIS — I639 Cerebral infarction, unspecified: Secondary | ICD-10-CM

## 2015-02-12 NOTE — Progress Notes (Signed)
Loop recorder 

## 2015-02-28 ENCOUNTER — Ambulatory Visit (INDEPENDENT_AMBULATORY_CARE_PROVIDER_SITE_OTHER): Payer: Medicare Other | Admitting: Cardiovascular Disease

## 2015-02-28 ENCOUNTER — Encounter: Payer: Self-pay | Admitting: Cardiovascular Disease

## 2015-02-28 VITALS — BP 140/68 | HR 60 | Ht 73.0 in | Wt 196.8 lb

## 2015-02-28 DIAGNOSIS — I480 Paroxysmal atrial fibrillation: Secondary | ICD-10-CM

## 2015-02-28 DIAGNOSIS — I2510 Atherosclerotic heart disease of native coronary artery without angina pectoris: Secondary | ICD-10-CM | POA: Diagnosis not present

## 2015-02-28 NOTE — Progress Notes (Signed)
Cardiology Office Note   Date:  02/28/2015   ID:  Manuel [name], DOB 11-Mar-1922, MRN [id number]  PCP:  Shirline Frees, MD  Cardiologist:   Thayer Headings, MD   Chief Complaint  Patient presents with  . Hypertension   1. Hypertension 2. Mallory-Weiss tear 3. Peripheral neuropathy -  4. Paroxysmal atrial fibrillation  History of Present Illness:  Manuel [name] is an 81 yo who I have not seen in the past 2 years or so.  He has had some atypical chest pain but none recently. We is now using a scooter to get around. He is quite please with his Amigo scooter.   January 23, 2013:   Manuel [name] complains of generalized fatigue. He is sleepy. He checks his BP frequently.   January 16, 2014: Manuel [name] is doing OK. He has generalized weakness most of his left leg. He otherwise feels good for someone who is 81 years old.  Mar 01, 2014:  Oct. 19, 2015:  Manuel [name] is doing fairly well. We recently received information from Dr. Caryl Comes stating that he had significant amount of atrial fibrillation. Manuel [name] has a history of stroke. We had him come to his office visit several weeks early to discuss this issue. He's doing fairly well. He uses his motorized scooter to get around. He is slightly unsteady on his feet but has not had any history of falling  Feb 28, 2015  Manuel [name] is a 81 y.o. male who presents for   Follow-up of his hypertension and atrial fibrillation. We reviewed his pliable Loop data. He has multiple episodes of paroxysmal A. fib ablation. He typically has 3 or 4 hours per day. He remains on Eliquis. He's doing well and has not had any bleeding complications.  BP is well controlled.     Past Medical History  Diagnosis Date  . Hypertension   . Rectal bleed   . Mallory-Weiss tear 2011    treated with 6 units PRBCs. No longer on aspirin  . Carotid artery occlusion   . Zenker diverticulum   . Colon polyps   . Gastric hemorrhage   . CAD (coronary artery disease)   . Hearing loss     . Peripheral neuropathy   . Atrial fibrillation   . Stroke July 2015    Right Leg Weakness    Past Surgical History  Procedure Laterality Date  . Back surgery    . Appendectomy    . Abdominal surgery    . Hernia repair    . Loop recorder implant  05/13/14    MDT LINQ implanted by Dr Caryl Comes for cryptogenic stroke  . Loop recorder implant N/A 05/13/2014    Procedure: LOOP RECORDER IMPLANT;  Surgeon: Deboraha Sprang, MD;  Location: [facility] CATH LAB;  Service: Cardiovascular;  Laterality: N/A;  . Cholecystectomy  1999    Gall Bladder     Current Outpatient Prescriptions  Medication Sig Dispense Refill  . acetaminophen (TYLENOL) 650 MG CR tablet Take 650 mg by mouth every 8 (eight) hours as needed for pain (give with docusate for stool softner).    Marland Kitchen apixaban (ELIQUIS) 5 MG TABS tablet Take 1 tablet (5 mg total) by mouth 2 (two) times daily. 62 tablet 11  . carvedilol (COREG) 3.125 MG tablet Take 1 tablet (3.125 mg total) by mouth 2 (two) times daily. 60 tablet 6  . Cholecalciferol (VITAMIN D) 2000 UNITS tablet Take 2,000 Units by mouth daily.    . Coenzyme Q10 (COQ10) 200  MG CAPS Take 200 mg by mouth daily.    Marland Kitchen docusate sodium (COLACE) 100 MG capsule Take 100 mg by mouth daily as needed for mild constipation (give with tylenol arthritis for a stool softner).    Marland Kitchen lisinopril (PRINIVIL,ZESTRIL) 20 MG tablet Take 1 tablet (20 mg total) by mouth daily. 30 tablet 5  . Multiple Vitamin (MULTIVITAMIN WITH MINERALS) TABS tablet Take 1 tablet by mouth daily. NutraLite    . OMEGA 3-6-9 FATTY ACIDS PO Take 1 capsule by mouth daily.    Marland Kitchen omeprazole (PRILOSEC) 40 MG capsule Take 40 mg by mouth at bedtime.     . vitamin C (ASCORBIC ACID) 500 MG tablet Take 250 mg by mouth at bedtime.     . vitamin E 400 UNIT capsule Take 400 Units by mouth daily.     No current facility-administered medications for this visit.    Allergies:   Adhesive; Codeine; Ivp dye; Penicillins; Rofecoxib; and Sulfonamide  derivatives    Social History:  The patient  reports that he has never smoked. He has never used smokeless tobacco. He reports that he does not drink alcohol or use illicit drugs.   Family History:  The patient's family history includes Cancer in his brother and sister; Cancer (age of onset: 65) in his mother; Diabetes in his son; Heart attack in his father; Heart disease in his brother, brother, father, other, and sister; Parkinson's disease in his sister; Stroke in his other.    ROS:  Please see the history of present illness.    Review of Systems: Constitutional:  denies fever, chills, diaphoresis, appetite change and fatigue.  HEENT: denies photophobia, eye pain, redness, hearing loss, ear pain, congestion, sore throat, rhinorrhea, sneezing, neck pain, neck stiffness and tinnitus.  Respiratory: denies SOB, DOE, cough, chest tightness, and wheezing.  Cardiovascular: denies chest pain, palpitations and leg swelling.  Gastrointestinal: denies nausea, vomiting, abdominal pain, diarrhea, constipation, blood in stool.  Genitourinary: denies dysuria, urgency, frequency, hematuria, flank pain and difficulty urinating.  Musculoskeletal: denies  myalgias, back pain, joint swelling, arthralgias and gait problem.   Skin: denies pallor, rash and wound.  Neurological: denies dizziness, seizures, syncope, weakness, light-headedness, numbness and headaches.   Hematological: denies adenopathy, easy bruising, personal or family bleeding history.  Psychiatric/ Behavioral: denies suicidal ideation, mood changes, confusion, nervousness, sleep disturbance and agitation.       All other systems are reviewed and negative.    PHYSICAL EXAM: VS:  BP 140/68 mmHg  Pulse 60  Ht 6\' 1"  (1.854 m)  Wt 196 lb 12.8 oz (89.268 kg)  BMI 25.97 kg/m2 , BMI Body mass index is 25.97 kg/(m^2). GEN: Well nourished, well developed, in no acute distress HEENT: normal Neck: no JVD, carotid bruits, or masses Cardiac:  RRR , frequent  prematures beats ; no murmurs, rubs, or gallops,no edema  Respiratory:  clear to auscultation bilaterally, normal work of breathing GI: soft, nontender, nondistended, + BS MS: no deformity or atrophy Skin: warm and dry, no rash Neuro:  Strength and sensation are intact Psych: normal   EKG:  EKG is not ordered today.    Recent Labs: 05/08/2014: ALT 11 09/13/2014: BUN 24*; Creatinine 1.1; Hemoglobin 13.2; Platelets 195.0; Potassium 4.6; Sodium 138    Lipid Panel    Component Value Date/Time   CHOL 139 05/09/2014 0535   TRIG 131 05/09/2014 0535   HDL 28* 05/09/2014 0535   CHOLHDL 5.0 05/09/2014 0535   VLDL 26 05/09/2014 0535   LDLCALC 85  05/09/2014 0535      Wt Readings from Last 3 Encounters:  02/28/15 196 lb 12.8 oz (89.268 kg)  12/19/14 192 lb (87.091 kg)  08/13/14 194 lb 3.2 oz (88.089 kg)      Other studies Reviewed: Additional studies/ records that were reviewed today include: . Review of the above records demonstrates:    ASSESSMENT AND PLAN:  1. Hypertension  - BP is well controlled.  2. Mallory-Weiss tear 3. Peripheral neuropathy -  4. Paroxysmal atrial fibrillation - tolerating the Eliquis well. No bleeding issues  Rate is well controlled.    Current medicines are reviewed at length with the patient today.  The patient has concerns regarding medicines.  The following changes have been made:  no change  Labs/ tests ordered today include:  No orders of the defined types were placed in this encounter.     Disposition:   FU with 6 months     Jamilee Lafosse, Wonda Cheng, MD  02/28/2015 10:07 AM    [facility] [place], [place], [place]  [address] Phone: [phone]; Fax: [phone]   [facility]  [address] [place], [place]  [address] [phone]    Fax [phone]

## 2015-02-28 NOTE — Patient Instructions (Signed)
Medication Instructions:  Your physician recommends that you continue on your current medications as directed. Please refer to the Current Medication list given to you today.   Labwork: Your physician recommends that you return for lab work in: 6 months on the day of or a few days before your office visit with Dr. Nahser.  You will need to FAST for this appointment - nothing to eat or drink after midnight the night before except water.   Testing/Procedures: None  Follow-Up: Your physician wants you to follow-up in: 6 months with Dr. Nahser.  You will receive a reminder letter in the mail two months in advance. If you don't receive a letter, please call our office to schedule the follow-up appointment.      

## 2015-03-10 ENCOUNTER — Ambulatory Visit (INDEPENDENT_AMBULATORY_CARE_PROVIDER_SITE_OTHER): Payer: Medicare Other | Admitting: *Deleted

## 2015-03-10 DIAGNOSIS — I639 Cerebral infarction, unspecified: Secondary | ICD-10-CM | POA: Diagnosis not present

## 2015-03-10 LAB — CUP PACEART REMOTE DEVICE CHECK: Date Time Interrogation Session: 20160516140457

## 2015-03-12 ENCOUNTER — Encounter: Payer: Self-pay | Admitting: Internal Medicine

## 2015-03-14 ENCOUNTER — Ambulatory Visit (INDEPENDENT_AMBULATORY_CARE_PROVIDER_SITE_OTHER): Payer: Medicare Other | Admitting: *Deleted

## 2015-03-14 DIAGNOSIS — I4891 Unspecified atrial fibrillation: Secondary | ICD-10-CM | POA: Diagnosis not present

## 2015-03-14 DIAGNOSIS — Z5181 Encounter for therapeutic drug level monitoring: Secondary | ICD-10-CM

## 2015-03-14 LAB — CBC
HEMATOCRIT: 38.9 % — AB (ref 39.0–52.0)
Hemoglobin: 13.4 g/dL (ref 13.0–17.0)
MCHC: 34.3 g/dL (ref 30.0–36.0)
MCV: 87.2 fl (ref 78.0–100.0)
Platelets: 199 10*3/uL (ref 150.0–400.0)
RBC: 4.47 Mil/uL (ref 4.22–5.81)
RDW: 16.3 % — AB (ref 11.5–15.5)
WBC: 5.8 10*3/uL (ref 4.0–10.5)

## 2015-03-14 LAB — BASIC METABOLIC PANEL
BUN: 24 mg/dL — ABNORMAL HIGH (ref 6–23)
CHLORIDE: 108 meq/L (ref 96–112)
CO2: 24 meq/L (ref 19–32)
Calcium: 9 mg/dL (ref 8.4–10.5)
Creatinine, Ser: 1.08 mg/dL (ref 0.40–1.50)
GFR: 68.18 mL/min (ref >60.00)
Glucose, Bld: 95 mg/dL (ref 70–99)
Potassium: 4.6 mEq/L (ref 3.5–5.1)
Sodium: 138 mEq/L (ref 135–145)

## 2015-03-14 NOTE — Progress Notes (Signed)
Loop recorder 

## 2015-03-14 NOTE — Progress Notes (Signed)
Pt was started on Eliquis 5mg  twice a day for Afib on 08/13/14 by Dr. [name].    Reviewed patients medication list.  Pt is not currently on any combined P-gp and strong CYP3A4 inhibitors/inducers (ketoconazole, traconazole, ritonavir, carbamazepine, phenytoin, rifampin, St. John's wort).  Reviewed labs: SCr 1.08, Hb 13.4, Hct 38.9, Weight 89 kg.  Dose appropiate based on dosing criteria.     A full discussion of the nature of anticoagulants has been carried out.  A benefit/risk analysis has been presented to the patient, so that they understand the justification for choosing anticoagulation with Eliquis at this time.  The need for compliance is stressed.  Pt is aware to take the medication twice daily.  Side effects of potential bleeding are discussed, including unusual colored urine or stools, coughing up blood or coffee ground emesis, nose bleeds or serious fall or head trauma.  Discussed signs and symptoms of stroke. The patient should avoid any OTC items containing aspirin or ibuprofen.  Avoid alcohol consumption.   Call if any signs of abnormal bleeding.  Discussed financial obligations and resolved any difficulty in obtaining medication.    Called and spoke with patient and advised to continue taking Eliquis 5mg  twice a day.

## 2015-03-19 ENCOUNTER — Emergency Department (HOSPITAL_COMMUNITY)
Admission: EM | Admit: 2015-03-19 | Discharge: 2015-03-19 | Disposition: A | Discharge status: Home / Self Care | Payer: Medicare Other | Attending: Emergency Medicine | Admitting: Emergency Medicine

## 2015-03-19 ENCOUNTER — Encounter (HOSPITAL_COMMUNITY): Payer: Self-pay | Admitting: Emergency Medicine

## 2015-03-19 ENCOUNTER — Emergency Department (HOSPITAL_COMMUNITY): Payer: Medicare Other

## 2015-03-19 DIAGNOSIS — H9190 Unspecified hearing loss, unspecified ear: Secondary | ICD-10-CM | POA: Diagnosis not present

## 2015-03-19 DIAGNOSIS — W01198A Fall on same level from slipping, tripping and stumbling with subsequent striking against other object, initial encounter: Secondary | ICD-10-CM | POA: Diagnosis not present

## 2015-03-19 DIAGNOSIS — Z8673 Personal history of transient ischemic attack (TIA), and cerebral infarction without residual deficits: Secondary | ICD-10-CM | POA: Insufficient documentation

## 2015-03-19 DIAGNOSIS — Z86010 Personal history of colonic polyps: Secondary | ICD-10-CM | POA: Insufficient documentation

## 2015-03-19 DIAGNOSIS — S4991XA Unspecified injury of right shoulder and upper arm, initial encounter: Secondary | ICD-10-CM | POA: Diagnosis present

## 2015-03-19 DIAGNOSIS — Y998 Other external cause status: Secondary | ICD-10-CM | POA: Diagnosis not present

## 2015-03-19 DIAGNOSIS — Z880 Allergy status to penicillin: Secondary | ICD-10-CM | POA: Diagnosis not present

## 2015-03-19 DIAGNOSIS — Z79899 Other long term (current) drug therapy: Secondary | ICD-10-CM | POA: Diagnosis not present

## 2015-03-19 DIAGNOSIS — S52601A Unspecified fracture of lower end of right ulna, initial encounter for closed fracture: Secondary | ICD-10-CM

## 2015-03-19 DIAGNOSIS — Y92009 Unspecified place in unspecified non-institutional (private) residence as the place of occurrence of the external cause: Secondary | ICD-10-CM | POA: Diagnosis not present

## 2015-03-19 DIAGNOSIS — S52691A Other fracture of lower end of right ulna, initial encounter for closed fracture: Secondary | ICD-10-CM | POA: Diagnosis not present

## 2015-03-19 DIAGNOSIS — I2510 Atherosclerotic heart disease of native coronary artery without angina pectoris: Secondary | ICD-10-CM | POA: Insufficient documentation

## 2015-03-19 DIAGNOSIS — S51811A Laceration without foreign body of right forearm, initial encounter: Secondary | ICD-10-CM | POA: Diagnosis not present

## 2015-03-19 DIAGNOSIS — Z8719 Personal history of other diseases of the digestive system: Secondary | ICD-10-CM | POA: Insufficient documentation

## 2015-03-19 DIAGNOSIS — I10 Essential (primary) hypertension: Secondary | ICD-10-CM | POA: Insufficient documentation

## 2015-03-19 DIAGNOSIS — Y9389 Activity, other specified: Secondary | ICD-10-CM | POA: Diagnosis not present

## 2015-03-19 MED ORDER — LIDOCAINE HCL 1 % IJ SOLN
INTRAMUSCULAR | Status: AC
  Administered 2015-03-19: 20 mL
  Filled 2015-03-19: qty 20

## 2015-03-19 MED ORDER — HYDROCODONE-ACETAMINOPHEN 5-325 MG PO TABS: 1.0000 | ORAL_TABLET | ORAL | Status: AC | PRN

## 2015-03-19 MED ORDER — ONDANSETRON 8 MG PO TBDP: 8.0000 mg | ORAL_TABLET | Freq: Three times a day (TID) | ORAL | Status: AC | PRN

## 2015-03-19 MED ORDER — ONDANSETRON HCL 4 MG/2ML IJ SOLN
4.0000 mg | Freq: Once | INTRAMUSCULAR | Status: AC
  Administered 2015-03-19: 4 mg via INTRAVENOUS
  Filled 2015-03-19: qty 2

## 2015-03-19 MED ORDER — LIDOCAINE HCL 1 % IJ SOLN
20.0000 mL | Freq: Once | INTRAMUSCULAR | Status: AC
  Administered 2015-03-19: 20 mL

## 2015-03-19 MED ORDER — MORPHINE SULFATE 4 MG/ML IJ SOLN
4.0000 mg | INTRAMUSCULAR | Status: DC | PRN
  Administered 2015-03-19: 4 mg via INTRAVENOUS
  Filled 2015-03-19: qty 1

## 2015-03-19 NOTE — ED Provider Notes (Signed)
CSN: [id number]     Arrival date & time 03/19/15  1307 History   First MD Initiated Contact with Patient 03/19/15 1325     Chief Complaint  Patient presents with  . Arm Injury  . Fall      HPI Patient presents after a fall today.  He was losing his balance and placed his right arm out and struck his right forearm against a piece of furniture.  He is brought to emergency department for evaluation.  Denies head injury.  No loss consciousness.  No neck pain.  Denies chest pain shortness of breath.  This resulted in a laceration of his right forearm as well.   Past Medical History  Diagnosis Date  . Hypertension   . Rectal bleed   . Mallory-Weiss tear 2011    treated with 6 units PRBCs. No longer on aspirin  . Carotid artery occlusion   . Zenker diverticulum   . Colon polyps   . Gastric hemorrhage   . CAD (coronary artery disease)   . Hearing loss   . Peripheral neuropathy   . Atrial fibrillation   . Stroke July 2015    Right Leg Weakness   Past Surgical History  Procedure Laterality Date  . Back surgery    . Appendectomy    . Abdominal surgery    . Hernia repair    . Loop recorder implant  05/13/14    MDT LINQ implanted by Dr Caryl Comes for cryptogenic stroke  . Loop recorder implant N/A 05/13/2014    Procedure: LOOP RECORDER IMPLANT;  Surgeon: Deboraha Sprang, MD;  Location[facility] CATH LAB;  Service: Cardiovascular;  Laterality: N/A;  . Cholecystectomy  1999    Gall Bladder   Family History  Problem Relation Age of Onset  . Cancer Mother 27    Liver  . Heart attack Father     Deceased, 25  . Heart disease Father     After age 74  . Heart disease Sister     Heart Disease before age 70  . Cancer Sister   . Diabetes Son   . Parkinson's disease Sister   . Stroke Other     Deceased, siblings  . Heart disease Other     Deceased, siblings  . Heart disease Brother     After age 51  . Cancer Brother     Prostate  . Heart disease Brother     After age 39   History   Substance Use Topics  . Smoking status: Never Smoker   . Smokeless tobacco: Never Used  . Alcohol Use: No    Review of Systems  All other systems reviewed and are negative.     Allergies  Adhesive; Codeine; Ivp dye; Penicillins; Rofecoxib; and Sulfonamide derivatives  Home Medications   Prior to Admission medications   Medication Sig Start Date End Date Taking? Authorizing Provider  acetaminophen (TYLENOL) 650 MG CR tablet Take 650 mg by mouth every 8 (eight) hours as needed for pain (give with docusate for stool softner).   Yes Historical Provider, MD  apixaban (ELIQUIS) 5 MG TABS tablet Take 1 tablet (5 mg total) by mouth 2 (two) times daily. 08/13/14  Yes Thayer Headings, MD  carvedilol (COREG) 3.125 MG tablet Take 1 tablet (3.125 mg total) by mouth 2 (two) times daily. 11/11/14  Yes Thayer Headings, MD  Cholecalciferol (VITAMIN D) 2000 UNITS tablet Take 2,000 Units by mouth daily.   Yes Historical Provider, MD  Coenzyme  Q10 (COQ10) 200 MG CAPS Take 200 mg by mouth daily.   Yes Historical Provider, MD  diphenhydrAMINE (SOMINEX) 25 MG tablet Take 50 mg by mouth at bedtime.   Yes Historical Provider, MD  docusate sodium (COLACE) 100 MG capsule Take 100 mg by mouth daily as needed for mild constipation (give with tylenol arthritis for a stool softner).   Yes Historical Provider, MD  lisinopril (PRINIVIL,ZESTRIL) 20 MG tablet Take 1 tablet (20 mg total) by mouth daily. 01/23/15  Yes Thayer Headings, MD  Multiple Vitamin (MULTIVITAMIN WITH MINERALS) TABS tablet Take 1 tablet by mouth daily. NutraLite   Yes Historical Provider, MD  OMEGA 3-6-9 FATTY ACIDS PO Take 1 capsule by mouth daily.   Yes Historical Provider, MD  omeprazole (PRILOSEC) 40 MG capsule Take 40 mg by mouth every morning.  07/13/14  Yes Historical Provider, MD  vitamin C (ASCORBIC ACID) 500 MG tablet Take 250 mg by mouth at bedtime.    Yes Historical Provider, MD   BP 187/77 mmHg  Pulse 57  Temp(Src) 98.1 F (36.7 C)  (Oral)  Resp 16  SpO2 97% Physical Exam  ED Course  Procedures (including critical care time)  LACERATION REPAIR Performed by: Hoy Morn Consent: Verbal consent obtained. Risks and benefits: risks, benefits and alternatives were discussed Patient identity confirmed: provided demographic data Time out performed prior to procedure Prepped and Draped in normal sterile fashion Wound explored Laceration Location: right forearm Laceration Length: 6cm No Foreign Bodies seen or palpated Anesthesia: local infiltration Local anesthetic: lidocaine 1% without epinephrine Anesthetic total: 10 ml Irrigation method: syringe Amount of cleaning: standard Skin closure: 3-0 prolene Number of sutures or staples: 8 Technique: running interlocked Patient tolerance: Patient tolerated the procedure well with no immediate complications.   Labs Review Labs Reviewed - No data to display  Imaging Review Dg Forearm Right  03/19/2015   CLINICAL DATA:  Patient fell after losing balance  EXAM: RIGHT FOREARM - 2 VIEW  COMPARISON:  June 23, 2011  FINDINGS: Frontal and lateral views were obtained. There is a comminuted fracture through the mid ulna in near anatomic alignment. On the frontal view, there is a questionable fracture of the distal scaphoid bone. No other fractures. No dislocation. There is osteoarthritic change in the elbow joint region. There is also narrowing of the radiocarpal joint laterally.  IMPRESSION: Comminuted fracture mid ulna in near anatomic alignment. Question fracture distal scaphoid seen on the frontal view only. Advise dedicated right wrist images to further evaluate the distal scaphoid region. No dislocations. Areas of arthropathic change.   Electronically Signed   By: Lowella Grip III M.D.   On: 03/19/2015 14:27  I personally reviewed the imaging tests through PACS system I reviewed available ER/hospitalization records through the EMR    EKG Interpretation None       MDM   Final diagnoses:  Right distal ulnar fracture, closed, initial encounter  Laceration of right forearm, initial encounter    Orthopedic surgery follow-up for his comminuted ulna fracture.  He does have a laceration however this is not associated with his break.  I do not believe this is an open component.  I palpated the entirety of the laceration and explored it and there is no involvement of the bone.  Wound was washed out and closed.  Splinted in a sugar tong.    Jola Schmidt, MD 03/19/15 [phone]

## 2015-03-19 NOTE — ED Notes (Signed)
Pt was at home, bumped into small trampoline, lost balance and fell. Did not loose consciousness. Injury to right forearm, per EMS no obvious deformity, per wife there's a skin tear to right forearm that she bandaged at the scene.

## 2015-03-19 NOTE — Discharge Instructions (Signed)

## 2015-03-19 NOTE — ED Notes (Signed)
SPLINT AND SLING INTACT TO RUE GOOD CMS

## 2015-03-19 NOTE — ED Notes (Signed)
Bed: WA04 Expected date:  Expected time:  Means of arrival:  Comments: EMS-81 y/o fall

## 2015-03-26 LAB — CUP PACEART REMOTE DEVICE CHECK: Date Time Interrogation Session: 20160601155749

## 2015-04-02 ENCOUNTER — Encounter: Payer: Self-pay | Admitting: Internal Medicine

## 2015-04-08 ENCOUNTER — Encounter: Payer: Self-pay | Admitting: Internal Medicine

## 2015-04-08 ENCOUNTER — Ambulatory Visit (INDEPENDENT_AMBULATORY_CARE_PROVIDER_SITE_OTHER): Payer: Medicare Other | Admitting: *Deleted

## 2015-04-08 DIAGNOSIS — I639 Cerebral infarction, unspecified: Secondary | ICD-10-CM | POA: Diagnosis not present

## 2015-04-09 NOTE — Progress Notes (Signed)
Loop recorder 

## 2015-04-17 LAB — CUP PACEART REMOTE DEVICE CHECK: MDC IDC SESS DTM: 20160623151354

## 2015-04-21 ENCOUNTER — Other Ambulatory Visit: Payer: Self-pay

## 2015-05-08 ENCOUNTER — Ambulatory Visit (INDEPENDENT_AMBULATORY_CARE_PROVIDER_SITE_OTHER): Payer: Medicare Other | Admitting: *Deleted

## 2015-05-08 ENCOUNTER — Encounter: Payer: Self-pay | Admitting: Internal Medicine

## 2015-05-08 DIAGNOSIS — I639 Cerebral infarction, unspecified: Secondary | ICD-10-CM | POA: Diagnosis not present

## 2015-05-12 NOTE — Progress Notes (Signed)
Loop recorder 

## 2015-05-16 LAB — CUP PACEART REMOTE DEVICE CHECK: Date Time Interrogation Session: 20160722135555

## 2015-06-06 ENCOUNTER — Ambulatory Visit (INDEPENDENT_AMBULATORY_CARE_PROVIDER_SITE_OTHER): Payer: Medicare Other

## 2015-06-06 DIAGNOSIS — I639 Cerebral infarction, unspecified: Secondary | ICD-10-CM | POA: Diagnosis not present

## 2015-06-10 NOTE — Progress Notes (Signed)
Loop recorder 

## 2015-06-16 ENCOUNTER — Other Ambulatory Visit: Payer: Self-pay

## 2015-06-16 MED ORDER — CARVEDILOL 3.125 MG PO TABS: 3.1250 mg | ORAL_TABLET | Freq: Two times a day (BID) | ORAL | Status: DC

## 2015-06-16 NOTE — Telephone Encounter (Signed)
Thayer Headings, MD at 02/28/2015 10:06 AM  carvedilol (COREG) 3.125 MG tablet Take 1 tablet (3.125 mg total) by mouth 2 (two) times daily         Current medicines are reviewed at length with the patient today. The patient has concerns regarding medicines.  The following changes have been made: no change

## 2015-06-20 ENCOUNTER — Encounter: Payer: Self-pay | Admitting: *Deleted

## 2015-06-23 LAB — CUP PACEART REMOTE DEVICE CHECK: Date Time Interrogation Session: 20160829104415

## 2015-07-07 ENCOUNTER — Ambulatory Visit (INDEPENDENT_AMBULATORY_CARE_PROVIDER_SITE_OTHER): Payer: Medicare Other | Admitting: *Deleted

## 2015-07-07 ENCOUNTER — Encounter: Payer: Self-pay | Admitting: Internal Medicine

## 2015-07-07 DIAGNOSIS — I639 Cerebral infarction, unspecified: Secondary | ICD-10-CM | POA: Diagnosis not present

## 2015-07-10 NOTE — Progress Notes (Signed)
Loop recorder 

## 2015-07-15 ENCOUNTER — Encounter: Payer: Self-pay | Admitting: Gastroenterology

## 2015-07-16 ENCOUNTER — Encounter: Payer: Self-pay | Admitting: Internal Medicine

## 2015-07-22 ENCOUNTER — Other Ambulatory Visit: Payer: Self-pay

## 2015-07-22 MED ORDER — LISINOPRIL 20 MG PO TABS: 20.0000 mg | ORAL_TABLET | Freq: Every day | ORAL | Status: DC

## 2015-07-22 NOTE — Telephone Encounter (Signed)
Thayer Headings, MD at 02/28/2015 10:06 AM  lisinopril (PRINIVIL,ZESTRIL) 20 MG tabletTake 1 tablet (20 mg total) by mouth daily Current medicines are reviewed at length with the patient today. The patient has concerns regarding medicines. The following changes have been made: no change

## 2015-08-06 ENCOUNTER — Ambulatory Visit (INDEPENDENT_AMBULATORY_CARE_PROVIDER_SITE_OTHER): Payer: Medicare Other | Admitting: *Deleted

## 2015-08-06 DIAGNOSIS — I639 Cerebral infarction, unspecified: Secondary | ICD-10-CM | POA: Diagnosis not present

## 2015-08-07 NOTE — Progress Notes (Signed)
Loop recorder 

## 2015-08-10 LAB — CUP PACEART REMOTE DEVICE CHECK: Date Time Interrogation Session: 20160913150405

## 2015-08-10 NOTE — Progress Notes (Signed)
Carelink summary report received. Battery status OK. Normal device function. No new symptom, tachy, brady, or pause episodes. 130 AF episodes (burden 1.1%), +Eliquis, avg V rates well controlled. Monthly summary reports and ROV with SK on 09/12/15 at 10:30am.

## 2015-08-11 LAB — CUP PACEART REMOTE DEVICE CHECK: MDC IDC SESS DTM: 20161012200657

## 2015-08-11 NOTE — Progress Notes (Signed)
Carelink summary report received. Battery status OK. Normal device function. No new symptom episodes, tachy episodes, brady, or pause episodes. 133 AF episodes (burden 1.0%), +Eliquis, avg V rates well controlled, some EGMs suggest occasional undersensed PVCs. Monthly summary reports and ROV with SK on 09/12/15 at 10:30am.

## 2015-09-01 ENCOUNTER — Ambulatory Visit: Payer: Medicare Other | Admitting: Physician Assistant

## 2015-09-04 ENCOUNTER — Ambulatory Visit: Payer: Medicare Other | Admitting: Gastroenterology

## 2015-09-05 ENCOUNTER — Ambulatory Visit (INDEPENDENT_AMBULATORY_CARE_PROVIDER_SITE_OTHER): Payer: Medicare Other | Admitting: *Deleted

## 2015-09-05 DIAGNOSIS — I639 Cerebral infarction, unspecified: Secondary | ICD-10-CM

## 2015-09-08 NOTE — Progress Notes (Signed)
Carelink summary report / loop recorder 

## 2015-09-12 ENCOUNTER — Encounter: Payer: Self-pay | Admitting: Internal Medicine

## 2015-09-12 ENCOUNTER — Ambulatory Visit (INDEPENDENT_AMBULATORY_CARE_PROVIDER_SITE_OTHER): Payer: Medicare Other | Admitting: Internal Medicine

## 2015-09-12 VITALS — BP 138/54 | HR 66 | Ht 73.0 in | Wt 194.0 lb

## 2015-09-12 DIAGNOSIS — I4891 Unspecified atrial fibrillation: Secondary | ICD-10-CM

## 2015-09-12 DIAGNOSIS — I739 Peripheral vascular disease, unspecified: Secondary | ICD-10-CM

## 2015-09-12 LAB — CUP PACEART INCLINIC DEVICE CHECK: MDC IDC SESS DTM: 20161118153314

## 2015-09-12 MED ORDER — FUROSEMIDE 20 MG PO TABS: ORAL_TABLET | ORAL | Status: DC

## 2015-09-12 NOTE — Progress Notes (Signed)
Patient Care Team: Shirline Frees, MD as PCP - General (Family Medicine)   HPI  Manuel [name] is a 81 y.o. male Seen in follow-up for a loop recorder implanted for cryptogenic stroke. He has not been seen in 1.5 years.  Interval interrogation of his loop recorder had demonstrated atrial fibrillation and he was started on apixaban   He stopped it about 2 months ago because of cost.  He has bilateral asymmetric edema right greater than left. Energy level is otherwise pretty good. He walks infrequently and mostly uses a motorized wheelchair. His mind remains very sharp    PNa notes reviewe  Past Medical History  Diagnosis Date  . Hypertension   . Rectal bleed   . Mallory-Weiss tear 2011    treated with 6 units PRBCs. No longer on aspirin  . Carotid artery occlusion   . Zenker diverticulum   . Colon polyps   . Gastric hemorrhage   . CAD (coronary artery disease)   . Hearing loss   . Peripheral neuropathy ([place])   . Atrial fibrillation ([place])   . Stroke [facility]) July 2015    Right Leg Weakness    Past Surgical History  Procedure Laterality Date  . Back surgery    . Appendectomy    . Abdominal surgery    . Hernia repair    . Loop recorder implant  05/13/14    MDT LINQ implanted by Dr Caryl Comes for cryptogenic stroke  . Loop recorder implant N/A 05/13/2014    Procedure: LOOP RECORDER IMPLANT;  Surgeon: Deboraha Sprang, MD;  Location: [facility] CATH LAB;  Service: Cardiovascular;  Laterality: N/A;  . Cholecystectomy  1999    Gall Bladder    Current Outpatient Prescriptions  Medication Sig Dispense Refill  . acetaminophen (TYLENOL) 650 MG CR tablet Take 650 mg by mouth every 8 (eight) hours as needed for pain (give with docusate for stool softner).    Marland Kitchen apixaban (ELIQUIS) 5 MG TABS tablet Take 1 tablet (5 mg total) by mouth 2 (two) times daily. 62 tablet 11  . carvedilol (COREG) 3.125 MG tablet Take 1 tablet (3.125 mg total) by mouth 2 (two) times daily. 180 tablet 2  .  Cholecalciferol (VITAMIN D) 2000 UNITS tablet Take 2,000 Units by mouth daily.    . Coenzyme Q10 (COQ10) 200 MG CAPS Take 200 mg by mouth daily.    . diphenhydrAMINE (SOMINEX) 25 MG tablet Take 50 mg by mouth at bedtime.    . docusate sodium (COLACE) 100 MG capsule Take 100 mg by mouth daily as needed for mild constipation (give with tylenol arthritis for a stool softner).    Marland Kitchen HYDROcodone-acetaminophen (NORCO/VICODIN) 5-325 MG per tablet Take 1 tablet by mouth every 4 (four) hours as needed for moderate pain. 20 tablet 0  . lisinopril (PRINIVIL,ZESTRIL) 20 MG tablet Take 1 tablet (20 mg total) by mouth daily. 90 tablet 2  . Multiple Vitamin (MULTIVITAMIN WITH MINERALS) TABS tablet Take 1 tablet by mouth daily. NutraLite    . OMEGA 3-6-9 FATTY ACIDS PO Take 1 capsule by mouth daily.    Marland Kitchen omeprazole (PRILOSEC) 40 MG capsule Take 40 mg by mouth every morning.     . ondansetron (ZOFRAN ODT) 8 MG disintegrating tablet Take 1 tablet (8 mg total) by mouth every 8 (eight) hours as needed for nausea or vomiting. 10 tablet 0  . vitamin C (ASCORBIC ACID) 500 MG tablet Take 250 mg by mouth at bedtime.  No current facility-administered medications for this visit.    Allergies  Allergen Reactions  . Adhesive [Tape]     Plastic tape= tears skin  . Codeine Other (See Comments)    Pass out  . Ivp Dye [Iodinated Diagnostic Agents] Other (See Comments)    Doesn't remember reaction but it was severe  . Penicillins Hives and Itching  . Rofecoxib Other (See Comments)     elevated BP (Vioxx)  . Sulfonamide Derivatives Hives and Itching      Review of Systems negative except from HPI and PMH  Physical Exam BP 138/54 mmHg  Pulse 66  Ht 6\' 1"  (1.854 m)  Wt 194 lb (87.998 kg)  BMI 25.60 kg/m2 Well developed and well nourished in no acute distress HENT normal E scleral and icterus clear Neck Supple JVP flat; carotids brisk and full Clear to ausculation regular rate and rhythm, no murmurs  gallops or rub Soft with active bowel sounds No clubbing cyanosis right greater than left 2+ edema Edema Alert and oriented, grossly normal motor and sensory function Skin Warm and Dry  ECG demonstrates sinus rhythm at 66 Interval 17/10/42 Infrequent PVCs.   Interrogation of his loop recorder demonstrated 3. Episodes of atrial fibrillation more quiescent over recent months Assessment and  Plan  Atrial fibrillation  Cryptogenic stroke  Hypertension  Asymmetric edema  Implantable recorder--LINQ    Atrial fibrillation and is high CHADS-VASc score (greater than or equal to 5) anticoagulation is imperative especially now with his asymmetric edema.  We will give him samples for ELIQUIS today(creatinine 1.08 is on5/16)  We will also begin him on furosemide 20 mg every other day for his edema. Given the asymmetry, is possible that there is a DVT given the fact that he spends his day in a wheelchair, however, with the resumption of anticoagulation we will not do anything differently. He will follow-up with Dr. [name] as scheduled.  Blood pressure is well-controlled.

## 2015-09-12 NOTE — Patient Instructions (Addendum)
Medication Instructions: 1) Start lasix 20 mg one tablet by mouth once every other day 2) Start Xarelto 20 mg one tablet by mouth once daily  Labwork: - Your physician recommends that you return for lab work in: 2 weeks- BMP  Procedures/Testing: - none  Follow-Up: - Your physician wants you to follow-up in: 1 year with Dr. Caryl Comes. You will receive a reminder letter in the mail two months in advance. If you don't receive a letter, please call our office to schedule the follow-up appointment.  Any Additional Special Instructions Will Be Listed Below (If Applicable). - none

## 2015-10-01 ENCOUNTER — Other Ambulatory Visit (INDEPENDENT_AMBULATORY_CARE_PROVIDER_SITE_OTHER): Payer: Medicare Other | Admitting: *Deleted

## 2015-10-01 DIAGNOSIS — I4891 Unspecified atrial fibrillation: Secondary | ICD-10-CM

## 2015-10-01 DIAGNOSIS — I2510 Atherosclerotic heart disease of native coronary artery without angina pectoris: Secondary | ICD-10-CM

## 2015-10-01 LAB — BASIC METABOLIC PANEL
BUN: 28 mg/dL — AB (ref 7–25)
CO2: 23 mmol/L (ref 20–31)
Calcium: 9.4 mg/dL (ref 8.6–10.3)
Chloride: 105 mmol/L (ref 98–110)
Creat: 1.24 mg/dL — ABNORMAL HIGH (ref 0.70–1.11)
Glucose, Bld: 93 mg/dL (ref 65–99)
POTASSIUM: 4.8 mmol/L (ref 3.5–5.3)
SODIUM: 137 mmol/L (ref 135–146)

## 2015-10-01 LAB — LIPID PANEL
CHOLESTEROL: 153 mg/dL (ref 125–200)
HDL: 26 mg/dL — ABNORMAL LOW (ref >=40)
LDL Cholesterol: 89 mg/dL (ref <130)
TRIGLYCERIDES: 188 mg/dL — AB (ref <150)
Total CHOL/HDL Ratio: 5.9 Ratio — ABNORMAL HIGH (ref <=5.0)
VLDL: 38 mg/dL — AB (ref <30)

## 2015-10-01 LAB — HEPATIC FUNCTION PANEL
ALT: 13 U/L (ref 9–46)
AST: 22 U/L (ref 10–35)
Albumin: 3.9 g/dL (ref 3.6–5.1)
Alkaline Phosphatase: 48 U/L (ref 40–115)
BILIRUBIN INDIRECT: 0.5 mg/dL (ref 0.2–1.2)
Bilirubin, Direct: 0.1 mg/dL (ref <=0.2)
TOTAL PROTEIN: 6.9 g/dL (ref 6.1–8.1)
Total Bilirubin: 0.6 mg/dL (ref 0.2–1.2)

## 2015-10-01 NOTE — Addendum Note (Signed)
Addended by: Eulis Foster on: 10/01/2015 10:42 AM   Modules accepted: Orders

## 2015-10-01 NOTE — Addendum Note (Signed)
Addended by: Eulis Foster on: 10/01/2015 10:41 AM   Modules accepted: Orders

## 2015-10-02 ENCOUNTER — Encounter: Payer: Self-pay | Admitting: Cardiovascular Disease

## 2015-10-02 ENCOUNTER — Ambulatory Visit (INDEPENDENT_AMBULATORY_CARE_PROVIDER_SITE_OTHER): Payer: Medicare Other | Admitting: Cardiovascular Disease

## 2015-10-02 VITALS — BP 156/60 | HR 69 | Ht 73.0 in | Wt 196.0 lb

## 2015-10-02 DIAGNOSIS — I10 Essential (primary) hypertension: Secondary | ICD-10-CM | POA: Diagnosis not present

## 2015-10-02 DIAGNOSIS — I480 Paroxysmal atrial fibrillation: Secondary | ICD-10-CM

## 2015-10-02 NOTE — Progress Notes (Signed)
Cardiology Office Note   Date:  10/02/2015   ID:  [name], DOB July 16, 1922, MRN [id number]  PCP:  Shirline Frees, MD  Cardiologist:   Thayer Headings, MD   Chief Complaint  Patient presents with  . Coronary Artery Disease   1. Hypertension 2. Mallory-Weiss tear 3. Peripheral neuropathy -  4. Paroxysmal atrial fibrillation  History of Present Illness:  Manuel [name] is an 81 yo who I have not seen in the past 2 years or so.  He has had some atypical chest pain but none recently. We is now using a scooter to get around. He is quite pleased with his Amigo scooter.   January 23, 2013:   Manuel [name] complains of generalized fatigue. He is sleepy. He checks his BP frequently.   January 16, 2014: Manuel [name] is doing OK. He has generalized weakness most of his left leg. He otherwise feels good for someone who is 81 years old.  Mar 01, 2014:  Oct. 19, 2015:  Manuel [name] is doing fairly well. We recently received information from Dr. Caryl Comes stating that he had significant amount of atrial fibrillation. [name] has a history of stroke. We had him come to his office visit several weeks early to discuss this issue. He's doing fairly well. He uses his motorized scooter to get around. He is slightly unsteady on his feet but has not had any history of falling  Feb 28, 2015  Manuel [name] is a 81 y.o. male who presents for   Follow-up of his hypertension and atrial fibrillation. We reviewed his pliable Loop data. He has multiple episodes of paroxysmal A. fib ablation. He typically has 3 or 4 hours per day. He remains on Eliquis. He's doing well and has not had any bleeding complications.  BP is well controlled.   Dec.  8, 2016:  Doing well.   Is 52 now.   Wants me to help him make it to 81 years old .    he's had some leg swelling, right greater than left. Dr. Caryl Comes put him on Lasix O week ago.  Past Medical History  Diagnosis Date  . Hypertension   . Rectal bleed   . Mallory-Weiss tear 2011    treated with 6 units PRBCs. No longer on aspirin  . Carotid artery occlusion   . Zenker diverticulum   . Colon polyps   . Gastric hemorrhage   . CAD (coronary artery disease)   . Hearing loss   . Peripheral neuropathy ([place])   . Atrial fibrillation ([place])   . Stroke [facility]) July 2015    Right Leg Weakness    Past Surgical History  Procedure Laterality Date  . Back surgery    . Appendectomy    . Abdominal surgery    . Hernia repair    . Loop recorder implant  05/13/14    MDT LINQ implanted by Dr Caryl Comes for cryptogenic stroke  . Loop recorder implant N/A 05/13/2014    Procedure: LOOP RECORDER IMPLANT;  Surgeon: Deboraha Sprang, MD;  Location: [facility] CATH LAB;  Service: Cardiovascular;  Laterality: N/A;  . Cholecystectomy  1999    Gall Bladder     Current Outpatient Prescriptions  Medication Sig Dispense Refill  . acetaminophen (TYLENOL) 650 MG CR tablet Take 650 mg by mouth every 8 (eight) hours as needed for pain (give with docusate for stool softner).    . carvedilol (COREG) 3.125 MG tablet Take 1 tablet (3.125 mg total) by mouth 2 (two) times  daily. 180 tablet 2  . Cholecalciferol (VITAMIN D) 2000 UNITS tablet Take 2,000 Units by mouth daily.    . Coenzyme Q10 (COQ10) 200 MG CAPS Take 200 mg by mouth daily.    . diphenhydrAMINE (SOMINEX) 25 MG tablet Take 50 mg by mouth at bedtime.    . docusate sodium (COLACE) 100 MG capsule Take 100 mg by mouth daily as needed for mild constipation (give with tylenol arthritis for a stool softner).    . furosemide (LASIX) 20 MG tablet Take one tablet (20 mg) by mouth once every other day 90 tablet 1  . HYDROcodone-acetaminophen (NORCO/VICODIN) 5-325 MG per tablet Take 1 tablet by mouth every 4 (four) hours as needed for moderate pain. 20 tablet 0  . lisinopril (PRINIVIL,ZESTRIL) 20 MG tablet Take 1 tablet (20 mg total) by mouth daily. 90 tablet 2  . Multiple Vitamin (MULTIVITAMIN WITH MINERALS) TABS tablet Take 1 tablet by mouth daily. NutraLite    .  OMEGA 3-6-9 FATTY ACIDS PO Take 1 capsule by mouth daily.    Marland Kitchen omeprazole (PRILOSEC) 40 MG capsule Take 40 mg by mouth every morning.     . ondansetron (ZOFRAN ODT) 8 MG disintegrating tablet Take 1 tablet (8 mg total) by mouth every 8 (eight) hours as needed for nausea or vomiting. 10 tablet 0  . rivaroxaban (XARELTO) 20 MG TABS tablet Take 1 tablet (20 mg total) by mouth daily with supper.    . vitamin C (ASCORBIC ACID) 500 MG tablet Take 250 mg by mouth at bedtime.      No current facility-administered medications for this visit.    Allergies:   Adhesive; Codeine; Ivp dye; Penicillins; Rofecoxib; and Sulfonamide derivatives    Social History:  The patient  reports that he has never smoked. He has never used smokeless tobacco. He reports that he does not drink alcohol or use illicit drugs.   Family History:  The patient's family history includes Cancer in his brother and sister; Cancer (age of onset: 39) in his mother; Diabetes in his son; Heart attack in his father; Heart disease in his brother, brother, father, other, and sister; Parkinson's disease in his sister; Stroke in his other.    ROS:  Please see the history of present illness.    Review of Systems: Constitutional:  denies fever, chills, diaphoresis, appetite change and fatigue.  HEENT: denies photophobia, eye pain, redness, hearing loss, ear pain, congestion, sore throat, rhinorrhea, sneezing, neck pain, neck stiffness and tinnitus.  Respiratory: denies SOB, DOE, cough, chest tightness, and wheezing.  Cardiovascular: denies chest pain, palpitations and leg swelling.  Gastrointestinal: denies nausea, vomiting, abdominal pain, diarrhea, constipation, blood in stool.  Genitourinary: denies dysuria, urgency, frequency, hematuria, flank pain and difficulty urinating.  Musculoskeletal: denies  myalgias, back pain, joint swelling, arthralgias and gait problem.   Skin: denies pallor, rash and wound.  Neurological: denies dizziness,  seizures, syncope, weakness, light-headedness, numbness and headaches.   Hematological: denies adenopathy, easy bruising, personal or family bleeding history.  Psychiatric/ Behavioral: denies suicidal ideation, mood changes, confusion, nervousness, sleep disturbance and agitation.       All other systems are reviewed and negative.    PHYSICAL EXAM: VS:  BP 156/60 mmHg  Pulse 69  Ht 6\' 1"  (1.854 m)  Wt 196 lb (88.905 kg)  BMI 25.86 kg/m2 , BMI Body mass index is 25.86 kg/(m^2). GEN: Well nourished, well developed, in no acute distress HEENT: normal Neck: no JVD, carotid bruits, or masses Cardiac: RRR , frequent  prematures beats ; no murmurs, rubs, or gallops,no edema  Respiratory:  clear to auscultation bilaterally, normal work of breathing GI: soft, nontender, nondistended, + BS MS: no deformity or atrophy Skin: warm and dry, no rash Neuro:  Strength and sensation are intact Psych: normal   EKG:  EKG is not ordered today.    Recent Labs: 03/14/2015: Hemoglobin 13.4; Platelets 199.0 10/01/2015: ALT 13; BUN 28*; Creat 1.24*; Potassium 4.8; Sodium 137    Lipid Panel    Component Value Date/Time   CHOL 153 10/01/2015 1042   TRIG 188* 10/01/2015 1042   HDL 26* 10/01/2015 1042   CHOLHDL 5.9* 10/01/2015 1042   VLDL 38* 10/01/2015 1042   LDLCALC 89 10/01/2015 1042      Wt Readings from Last 3 Encounters:  10/02/15 196 lb (88.905 kg)  09/12/15 194 lb (87.998 kg)  03/19/15 196 lb (88.905 kg)      Other studies Reviewed: Additional studies/ records that were reviewed today include: . Review of the above records demonstrates:    ASSESSMENT AND PLAN:  1. Hypertension  - BP is a bit high.  Advised him to watch his salt. 2. Mallory-Weiss tear 3. Peripheral neuropathy -  4. Paroxysmal atrial fibrillation - tolerating the Eliquis well. No bleeding issues ,  Rate is well controlled.    Current medicines are reviewed at length with the patient today.  The patient  has concerns regarding medicines.  The following changes have been made:  no change  Labs/ tests ordered today include:  No orders of the defined types were placed in this encounter.     Disposition:   FU with 6 months     Nuvia Hileman, Wonda Cheng, MD  10/02/2015 2:36 PM    [facility] [place], [place], [place]  [address] Phone: [phone]; Fax: [phone]   [facility]  [address] [place],   [address] [phone]    Fax [phone]

## 2015-10-02 NOTE — Patient Instructions (Addendum)
Medication Instructions:  Your physician recommends that you continue on your current medications as directed. Please refer to the Current Medication list given to you today.   Labwork: Your physician recommends that you return for lab work in: 6 months for basic metabolic panel on the same day as your appointment with Dr. [name]   Testing/Procedures: None Ordered   Follow-Up: Your physician wants you to follow-up in: 6 months with Dr. [name].  You will receive a reminder letter in the mail two months in advance. If you don't receive a letter, please call our office to schedule the follow-up appointment.    Thank you for choosing [facility]! Christen Bame, RN [phone]

## 2015-10-06 ENCOUNTER — Ambulatory Visit (INDEPENDENT_AMBULATORY_CARE_PROVIDER_SITE_OTHER): Payer: Medicare Other | Admitting: *Deleted

## 2015-10-06 DIAGNOSIS — I639 Cerebral infarction, unspecified: Secondary | ICD-10-CM | POA: Diagnosis not present

## 2015-10-06 NOTE — Progress Notes (Signed)
Carelink Summary Report / Loop Recorder 

## 2015-10-13 LAB — CUP PACEART REMOTE DEVICE CHECK: Date Time Interrogation Session: 20161111200908

## 2015-10-13 NOTE — Progress Notes (Signed)
Carelink summary report received. Battery status OK. Normal device function. No new symptom episodes, tachy episodes, brady, or pause episodes. 140 AF episodes (burden 1.0%), +Xarelto, avg V rates controlled, many ECGs suggest SR w/PACs. Monthly summary reports and ROV with SK in 08/2016.

## 2015-10-14 ENCOUNTER — Other Ambulatory Visit: Payer: Self-pay | Admitting: *Deleted

## 2015-10-14 MED ORDER — RIVAROXABAN 20 MG PO TABS: 20.0000 mg | ORAL_TABLET | Freq: Every day | ORAL | Status: DC

## 2015-10-22 ENCOUNTER — Telehealth: Payer: Self-pay

## 2015-10-22 NOTE — Telephone Encounter (Signed)
Xarelto approved through 10/21/2016. PA # [id number].

## 2015-10-22 NOTE — Telephone Encounter (Signed)
Prior auth for Xarelto 20mg sent to [facility]. 

## 2015-10-30 ENCOUNTER — Telehealth: Payer: Self-pay | Admitting: Cardiovascular Disease

## 2015-10-30 NOTE — Telephone Encounter (Signed)
Pt's wife Jobe Gibbon calling stating that pt is out of his medication furosemide 20 mg tablet, taken every other day. Pt has been taking medication every day, 2 tablets daily. Wife stated that Dr. [name] stated for pt to take 2 tablets of furosemide 20 mg tablets daily for the pt's swelling. Pt can't get another refill, because it is too soon. Please advise

## 2015-10-31 MED ORDER — FUROSEMIDE 20 MG PO TABS: 20.0000 mg | ORAL_TABLET | Freq: Every day | ORAL | Status: AC

## 2015-10-31 NOTE — Telephone Encounter (Signed)
Spoke with patient's wife, Manuel [name], who states at last office visit patient states Dr. [name] advised him to take lasix twice daily and he is out of mediation.  She states she was not here with him that day.  I advised her I do not see documentation of this change; last office .  She states that he does not empty his bladder if he does not take lasix twice daily and that his abdomen gets distended when he does not urinate as frequently.  I advised her that this could be due to a problem with his bladder and that the lasix needs to be used for swelling related to heart function.  Last creatinine on 12/7 was 1.24, BUN was 28. I asked if patient has ever seen a urologist and she is uncertain.  I advised her I will discuss therapy with Dr. [name] - whether patient can take lasix daily or every other day.  She verbalized understanding and agreement.

## 2015-10-31 NOTE — Telephone Encounter (Signed)
Reviewed Dr. Nahser's advice with patient who verbalized understanding and agreement 

## 2015-10-31 NOTE — Telephone Encounter (Signed)
I think the BID lasix may be a bit too much. It may also be causing his creatinine to be slightly increased. Have him go back down to daily lasix Agree with having him see his primary MD or urology  To see if he has urinary issues.

## 2015-11-01 IMAGING — CR DG CHEST 2V
3 series · 3 of 3 positions shown · non-contrast
Comparison: PA and lateral chest x-ray August 13, 2014

CLINICAL DATA: One week of cough, congestion, and fever with
progressive symptoms over the past 24 hr; nonsmoker

EXAM:
CHEST  2 VIEW

[view not recorded (1 of 3)]
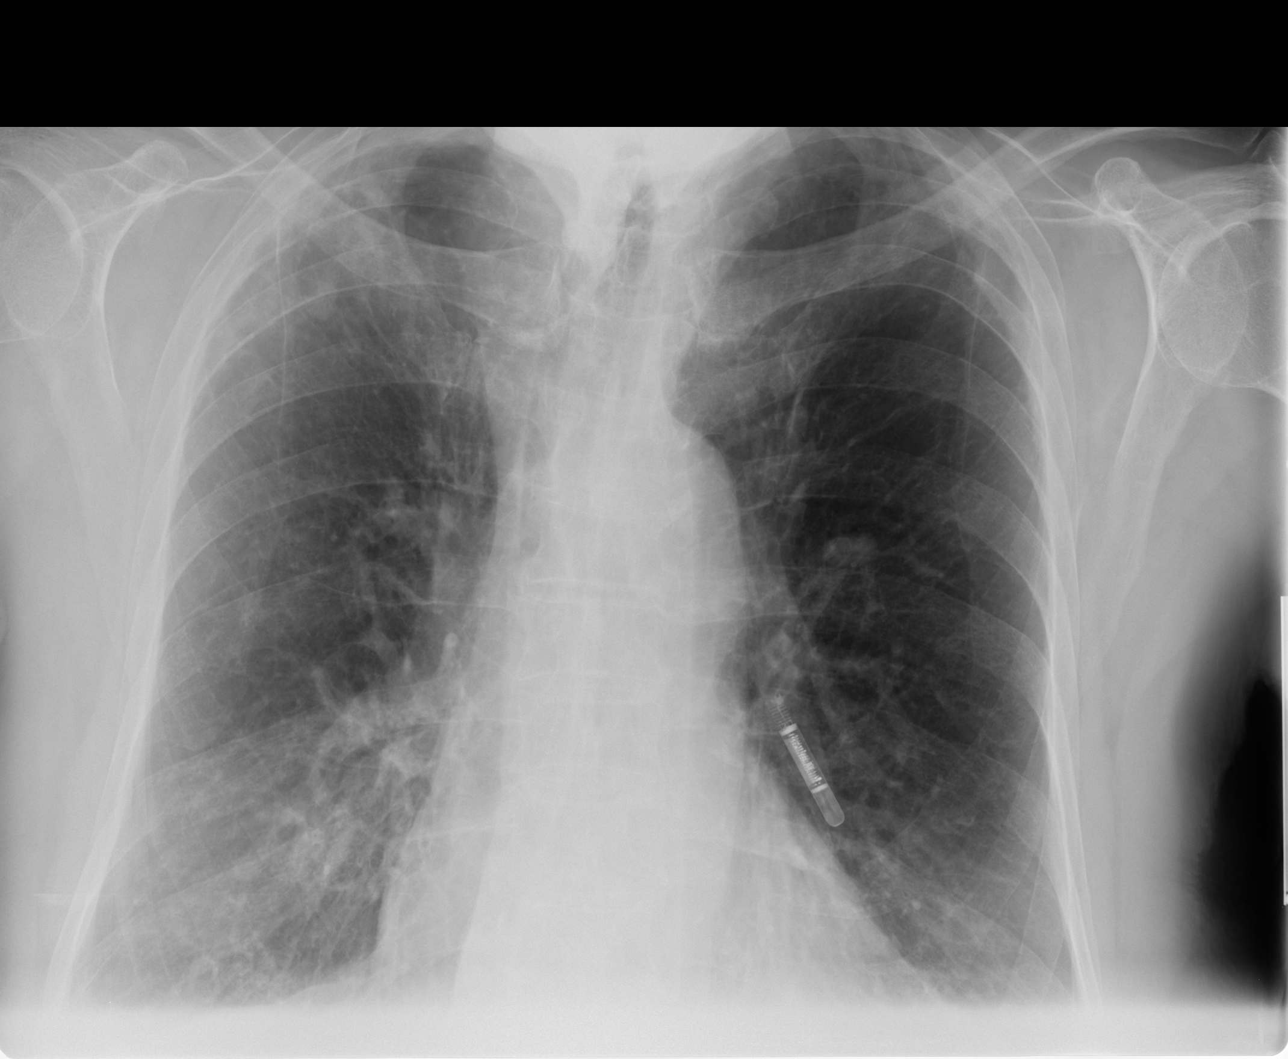

[view not recorded (2 of 3)]
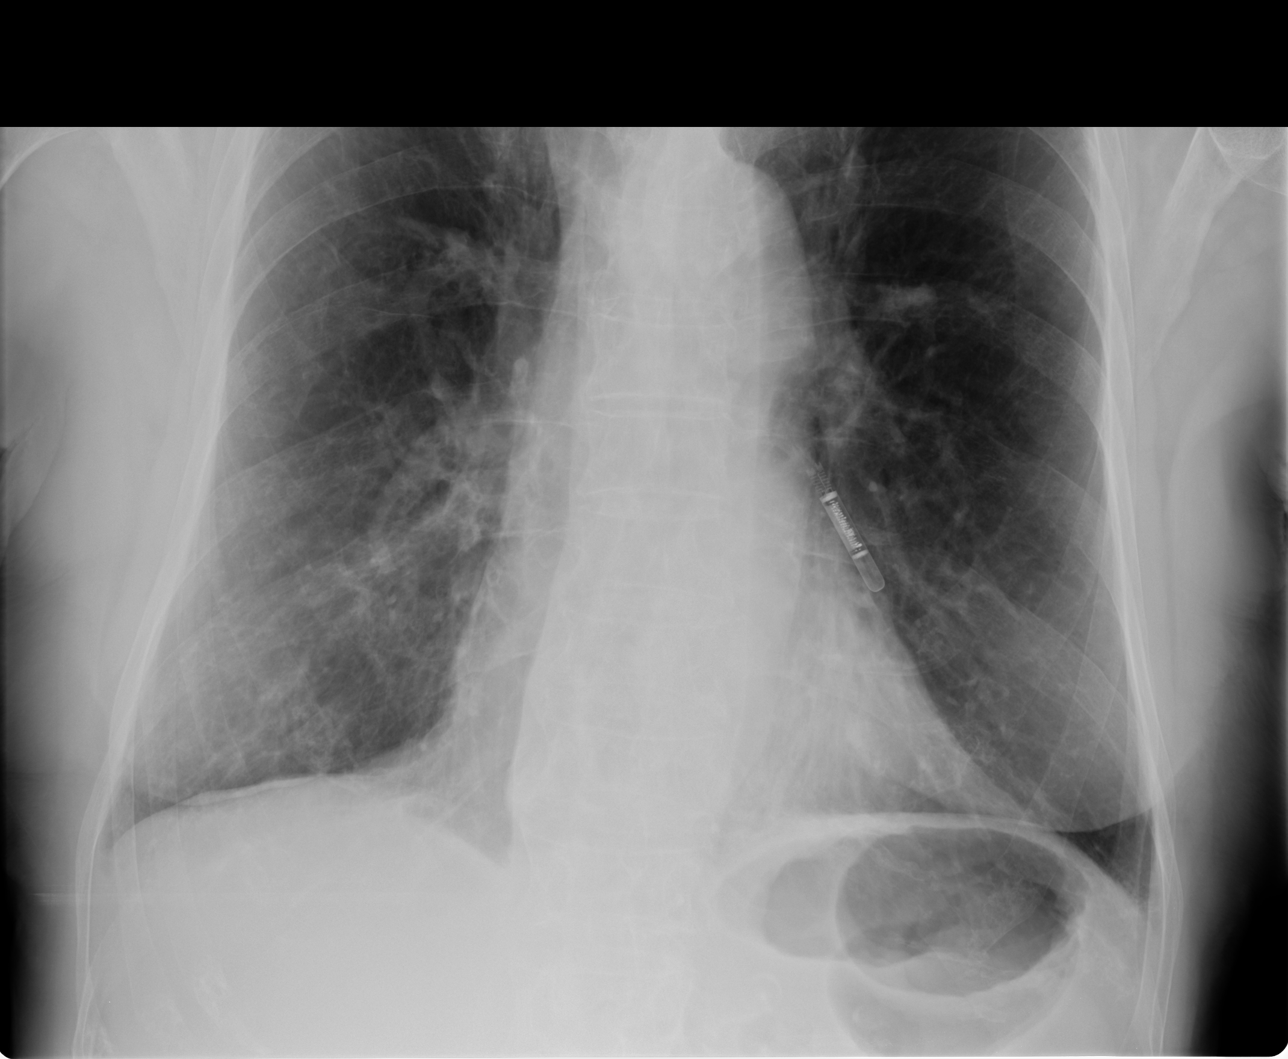

[view not recorded (3 of 3)]
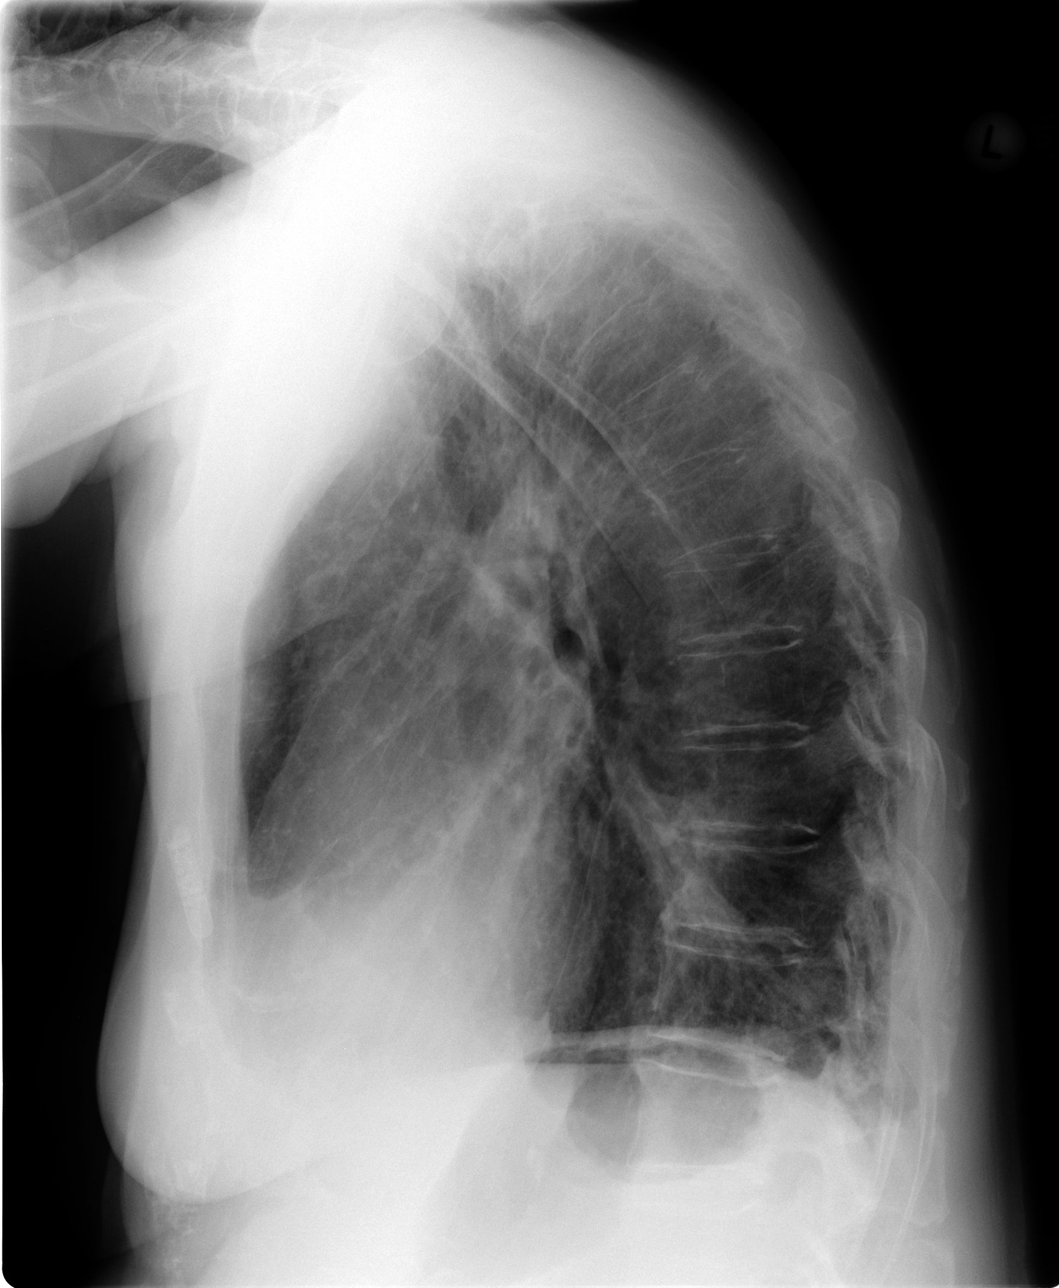

[3 of 3 positions shown; findings below may reference images not displayed]

FINDINGS: The lungs remain hyperinflated with hemidiaphragm flattening. The
interstitial markings are coarse but this is not new. There are
stable parenchymal densities in the right upper lobe and in the left
mid lung which likely reflect previous infectious or inflammatory
process ease. There is no alveolar pneumonia. There is no pleural
effusion or pneumothorax. The heart and pulmonary vascularity are
normal. The bony thorax is unremarkable.
IMPRESSION: COPD. There is stable pleural parenchymal scarring. There is no
evidence of pneumonia nor CHF.

## 2015-11-04 ENCOUNTER — Ambulatory Visit (INDEPENDENT_AMBULATORY_CARE_PROVIDER_SITE_OTHER): Payer: Medicare Other | Admitting: *Deleted

## 2015-11-04 DIAGNOSIS — I639 Cerebral infarction, unspecified: Secondary | ICD-10-CM

## 2015-11-05 NOTE — Progress Notes (Signed)
Carelink Summary Report / Loop Recorder 

## 2015-11-21 ENCOUNTER — Encounter: Payer: Self-pay | Admitting: Cardiology

## 2015-11-26 ENCOUNTER — Encounter: Payer: Self-pay | Admitting: Cardiology

## 2015-11-28 LAB — CUP PACEART REMOTE DEVICE CHECK: MDC IDC SESS DTM: 20161211203745

## 2015-12-04 ENCOUNTER — Ambulatory Visit (INDEPENDENT_AMBULATORY_CARE_PROVIDER_SITE_OTHER): Payer: Medicare Other | Admitting: *Deleted

## 2015-12-04 DIAGNOSIS — I639 Cerebral infarction, unspecified: Secondary | ICD-10-CM

## 2015-12-05 NOTE — Progress Notes (Signed)
Carelink Summary Report / Loop Recorder 

## 2015-12-18 ENCOUNTER — Encounter: Payer: Self-pay | Admitting: Family

## 2015-12-24 ENCOUNTER — Ambulatory Visit (INDEPENDENT_AMBULATORY_CARE_PROVIDER_SITE_OTHER)
Admission: RE | Admit: 2015-12-24 | Discharge: 2015-12-24 | Disposition: A | Discharge status: Home / Self Care | Payer: Medicare Other | Source: Ambulatory Visit | Attending: Family | Admitting: Family

## 2015-12-24 ENCOUNTER — Ambulatory Visit (INDEPENDENT_AMBULATORY_CARE_PROVIDER_SITE_OTHER): Payer: Medicare Other | Admitting: Family

## 2015-12-24 ENCOUNTER — Encounter: Payer: Self-pay | Admitting: Family

## 2015-12-24 ENCOUNTER — Ambulatory Visit (HOSPITAL_COMMUNITY)
Admission: RE | Admit: 2015-12-24 | Discharge: 2015-12-24 | Disposition: A | Discharge status: Home / Self Care | Payer: Medicare Other | Source: Ambulatory Visit | Attending: Family | Admitting: Family

## 2015-12-24 VITALS — BP 167/89 | HR 63 | Ht 73.0 in | Wt 195.5 lb

## 2015-12-24 DIAGNOSIS — I6523 Occlusion and stenosis of bilateral carotid arteries: Secondary | ICD-10-CM

## 2015-12-24 DIAGNOSIS — I70213 Atherosclerosis of native arteries of extremities with intermittent claudication, bilateral legs: Secondary | ICD-10-CM

## 2015-12-24 DIAGNOSIS — I739 Peripheral vascular disease, unspecified: Secondary | ICD-10-CM | POA: Diagnosis not present

## 2015-12-24 DIAGNOSIS — R030 Elevated blood-pressure reading, without diagnosis of hypertension: Secondary | ICD-10-CM | POA: Diagnosis not present

## 2015-12-24 DIAGNOSIS — IMO0001 Reserved for inherently not codable concepts without codable children: Secondary | ICD-10-CM

## 2015-12-24 LAB — CUP PACEART REMOTE DEVICE CHECK: Date Time Interrogation Session: 20170110203831

## 2015-12-24 NOTE — Progress Notes (Signed)
Chief Complaint: Extracranial Carotid Artery Stenosis/PAD   History of Present Illness  Manuel [name] is a 82 y.o. male patient of Dr. Scot Dock who presents for follow up of known extracranial carotid artery stenosis and peripheral artery occlusive disease.  He had a neurological evaluation of his lumbar spine issues and left foot drop, but states no help could be offered.   He had a TIA July, 2015, as manifested by right leg weakness, started on Eliquis by Dr. [name]. Dr. Elmarie Shiley note dated 08/13/14: He was recently found to have paroxysmal atrial fibrillation on his implantable loop recorder. I think that this probably explains his stroke. At this point we'll discontinue the Plavix and start him on Eliquis.   He had a cardiac work up.  He expresses discouragement at not being able to walk much due to foot drop, low back pain, and sciatic type pain affecting legs. He now c/o claudication type pain in both calves after walking 1 block, relieved by rest. Both calves also hurt after standing about 10-15 minutes.    He had a URI December 2015, treated.   Pt Diabetic: No  Pt smoker: non-smoker   Pt meds include:  Statin : No: has never had elevated cholesterol  ASA: No: had GI bleed and was stopped  Other anticoagulants/antiplatelets: on Eliquis since his TIA July 2015    Past Medical History  Diagnosis Date  . Hypertension   . Rectal bleed   . Mallory-Weiss tear 2011    treated with 6 units PRBCs. No longer on aspirin  . Carotid artery occlusion   . Zenker diverticulum   . Colon polyps   . Gastric hemorrhage   . CAD (coronary artery disease)   . Hearing loss   . Peripheral neuropathy ([place])   . Atrial fibrillation ([place])   . Stroke [facility]) July 2015    Right Leg Weakness    Social History Social History  Substance Use Topics  . Smoking status: Never Smoker   . Smokeless tobacco: Never Used  . Alcohol Use: No    Family History Family History  Problem  Relation Age of Onset  . Cancer Mother 46    Liver  . Heart attack Father     Deceased, 77  . Heart disease Father     After age 45  . Heart disease Sister     Heart Disease before age 75  . Cancer Sister   . Diabetes Son   . Parkinson's disease Sister   . Stroke Other     Deceased, siblings  . Heart disease Other     Deceased, siblings  . Heart disease Brother     After age 52  . Cancer Brother     Prostate  . Heart disease Brother     After age 53    Surgical History Past Surgical History  Procedure Laterality Date  . Back surgery    . Appendectomy    . Abdominal surgery    . Hernia repair    . Loop recorder implant  05/13/14    MDT LINQ implanted by Dr Caryl Comes for cryptogenic stroke  . Loop recorder implant N/A 05/13/2014    Procedure: LOOP RECORDER IMPLANT;  Surgeon: Deboraha Sprang, MD;  Location: [facility] CATH LAB;  Service: Cardiovascular;  Laterality: N/A;  . Cholecystectomy  1999    Gall Bladder    Allergies  Allergen Reactions  . Adhesive [Tape]     Plastic tape= tears skin  . Codeine  Other (See Comments)    Pass out  . Ivp Dye [Iodinated Diagnostic Agents] Other (See Comments)    Doesn't remember reaction but it was severe  . Penicillins Hives and Itching  . Rofecoxib Other (See Comments)     elevated BP (Vioxx)  . Sulfonamide Derivatives Hives and Itching    Current Outpatient Prescriptions  Medication Sig Dispense Refill  . acetaminophen (TYLENOL) 650 MG CR tablet Take 650 mg by mouth every 8 (eight) hours as needed for pain (give with docusate for stool softner).    . carvedilol (COREG) 3.125 MG tablet Take 1 tablet (3.125 mg total) by mouth 2 (two) times daily. 180 tablet 2  . Cholecalciferol (VITAMIN D) 2000 UNITS tablet Take 2,000 Units by mouth daily.    . Coenzyme Q10 (COQ10) 200 MG CAPS Take 200 mg by mouth daily.    . diphenhydrAMINE (SOMINEX) 25 MG tablet Take 50 mg by mouth at bedtime.    . docusate sodium (COLACE) 100 MG capsule Take 100 mg by  mouth daily as needed for mild constipation (give with tylenol arthritis for a stool softner).    . furosemide (LASIX) 20 MG tablet Take 1 tablet (20 mg total) by mouth daily. Take one tablet (20 mg) by mouth once every other day 90 tablet 3  . HYDROcodone-acetaminophen (NORCO/VICODIN) 5-325 MG per tablet Take 1 tablet by mouth every 4 (four) hours as needed for moderate pain. 20 tablet 0  . lisinopril (PRINIVIL,ZESTRIL) 20 MG tablet Take 1 tablet (20 mg total) by mouth daily. 90 tablet 2  . Multiple Vitamin (MULTIVITAMIN WITH MINERALS) TABS tablet Take 1 tablet by mouth daily. NutraLite    . OMEGA 3-6-9 FATTY ACIDS PO Take 1 capsule by mouth daily.    Marland Kitchen omeprazole (PRILOSEC) 40 MG capsule Take 40 mg by mouth every morning.     . ondansetron (ZOFRAN ODT) 8 MG disintegrating tablet Take 1 tablet (8 mg total) by mouth every 8 (eight) hours as needed for nausea or vomiting. 10 tablet 0  . rivaroxaban (XARELTO) 20 MG TABS tablet Take 1 tablet (20 mg total) by mouth daily with supper. 90 tablet 2  . vitamin C (ASCORBIC ACID) 500 MG tablet Take 250 mg by mouth at bedtime.      No current facility-administered medications for this visit.    Review of Systems : See HPI for pertinent positives and negatives.  Physical Examination  Filed Vitals:   12/24/15 1046  BP: 167/89  Pulse: 63  Height: 6\' 1"  (1.854 m)  Weight: 195 lb 8 oz (88.678 kg)  SpO2: 98%     Body mass index is 25.8 kg/(m^2).   General: WDWN male in NAD  GAIT: left foot drop, using walker  Eyes: PERRLA  Pulmonary: Non-labored, CTAB  Cardiac: Regular rhythm with occasional irregular contractions  VASCULAR EXAM  Carotid Bruits  Left  Right    Negative  Negative   Aorta is not palpable.  Radial pulses are 2+ palpable and equal.   LE Pulses  LEFT  RIGHT   FEMORAL  palpable  palpable   POPLITEAL  not palpable  not palpable   POSTERIOR TIBIAL  not palpable  not palpable   DORSALIS  PEDIS  ANTERIOR TIBIAL  palpable  Not palpable    Gastrointestinal: soft, nontender, BS WNL, no r/g, no palpable masses.   Musculoskeletal: No muscle atrophy/wasting. M/S 4/5 in arms, 3/5 in legs, Extremities without ischemic changes. He does have 1+ pretibial pitting edema in the  right lower leg and trace in the left lower leg. Both lower legs have hemosiderin deposits and chronic venous stasis dermatitis, no ulcers. Soles of feet are slightly ruddy.   Neurologic: A&O X 3; Appropriate Affect,  Speech is normal  CN 2-12 intact except for some hearing loss, Pain and light touch intact in extremities, Motor exam as listed above. Left foot drop with walking.               Non-Invasive Vascular Imaging CAROTID DUPLEX 12/24/2015   CEREBROVASCULAR DUPLEX EVALUATION    INDICATION: Carotid artery disease     PREVIOUS INTERVENTION(S):     DUPLEX EXAM:     RIGHT  LEFT  Peak Systolic Velocities (cm/s) End Diastolic Velocities (cm/s) Plaque LOCATION Peak Systolic Velocities (cm/s) End Diastolic Velocities (cm/s) Plaque  77 6  CCA PROXIMAL 73 11   66 8  CCA MID 84 13   73 8  CCA DISTAL 94 13   111 0  ECA 115 0   213 24 HT ICA PROXIMAL 67 18 HT  192 42 HT ICA MID 82 22   130 17  ICA DISTAL 68 19     3.22 ICA / CCA Ratio (PSV) 0.97  Antegrade  Vertebral Flow Antegrade    Brachial Systolic Pressure (mmHg)   Multiphasic (Subclavian artery) Brachial Artery Waveforms Multiphasic (Subclavian artery)    Plaque Morphology:  HM = Homogeneous, HT = Heterogeneous, CP = Calcific Plaque, SP = Smooth Plaque, IP = Irregular Plaque  ADDITIONAL FINDINGS:     IMPRESSION: Right internal carotid artery velocities suggest a 40-59% stenosis.  Left internal carotid artery velocities suggest a <40% stenosis.     Compared to the previous exam:  No significant change in comparison to the last exam.    ABI (Date: 12/24/2015)  R: 0.43 (0.72, 12/28/13), DP: monophasic (not detected on 12/28/13), PT:  monophasic (monophasic), TBI: absent (0.32 on 12/28/13)  L: 0.82 (1.02), DP: monophasic (biphasic on 12/28/13), PT: monophasic (biphasic), TBI: 0.59 (0.70)    Assessment: Manuel [name] is a 82 y.o. male who had a stroke in July 2015, thought by his cardiologist to be due to recent paroxysmal atrial fibrillation, Eliquis was started, Plavix was stopped. Pt has had no known subsequent stroke or TIA.  Today's carotid duplex suggests 40-59% right ICA stenosis and <40% left ICA stenosis.  No significant change in comparison to the last exam.  ABI's today indicate worsening arterial occlusive disease in both lower extremities compared to 2 years ago: TBI's decreased bilaterally, waveforms in left leg have diminished from bi to monophasic.  He has moderate claudication in both calves after walking a block and standing for 10-15 minutes.  He has no signs of ischemia in his feet/legs.  Pt states he has no problem with falling as he uses his walker.   I advised pt to see his PCP as soon as possible re his elevated blood pressure.    Plan:  Graduated walking program discussed in detail.  Follow-up in 6 months with ABI's, Carotid Duplex scan in a year.  I discussed in depth with the patient the nature of atherosclerosis, and emphasized the importance of maximal medical management including strict control of blood pressure, blood glucose, and lipid levels, obtaining regular exercise, and continued cessation of smoking.  The patient is aware that without maximal medical management the underlying atherosclerotic disease process will progress, limiting the benefit of any interventions. The patient was given information about stroke prevention and what  symptoms should prompt the patient to seek immediate medical care. Thank you for allowing [place] to participate in this patient's care.  Clemon Chambers, RN, MSN, FNP-C [facility] Office: [phone]  Clinic Physician:  Scot Dock  12/24/2015 9:54 AM

## 2015-12-24 NOTE — Progress Notes (Signed)
Carelink summary report received. Battery status OK. Normal device function. No new symptom episodes, tachy episodes, brady, or pause episodes. 309 AF episodes 5.3% +Xarelto, known AF. Monthly summary reports and ROV/PRN

## 2015-12-24 NOTE — Patient Instructions (Addendum)
Stroke Prevention Some medical conditions and behaviors are associated with an increased chance of having a stroke. You may prevent a stroke by making healthy choices and managing medical conditions. HOW CAN I REDUCE MY RISK OF HAVING A STROKE?   Stay physically active. Get at least 30 minutes of activity on most or all days.  Do not smoke. It may also be helpful to avoid exposure to secondhand smoke.  Limit alcohol use. Moderate alcohol use is considered to be:  No more than 2 drinks per day for men.  No more than 1 drink per day for nonpregnant women.  Eat healthy foods. This involves:  Eating 5 or more servings of fruits and vegetables a day.  Making dietary changes that address high blood pressure (hypertension), high cholesterol, diabetes, or obesity.  Manage your cholesterol levels.  Making food choices that are high in fiber and low in saturated fat, trans fat, and cholesterol may control cholesterol levels.  Take any prescribed medicines to control cholesterol as directed by your health care provider.  Manage your diabetes.  Controlling your carbohydrate and sugar intake is recommended to manage diabetes.  Take any prescribed medicines to control diabetes as directed by your health care provider.  Control your hypertension.  Making food choices that are low in salt (sodium), saturated fat, trans fat, and cholesterol is recommended to manage hypertension.  Ask your health care provider if you need treatment to lower your blood pressure. Take any prescribed medicines to control hypertension as directed by your health care provider.  If you are 18-39 years of age, have your blood pressure checked every 3-5 years. If you are 40 years of age or older, have your blood pressure checked every year.  Maintain a healthy weight.  Reducing calorie intake and making food choices that are low in sodium, saturated fat, trans fat, and cholesterol are recommended to manage  weight.  Stop drug abuse.  Avoid taking birth control pills.  Talk to your health care provider about the risks of taking birth control pills if you are over 35 years old, smoke, get migraines, or have ever had a blood clot.  Get evaluated for sleep disorders (sleep apnea).  Talk to your health care provider about getting a sleep evaluation if you snore a lot or have excessive sleepiness.  Take medicines only as directed by your health care provider.  For some people, aspirin or blood thinners (anticoagulants) are helpful in reducing the risk of forming abnormal blood clots that can lead to stroke. If you have the irregular heart rhythm of atrial fibrillation, you should be on a blood thinner unless there is a good reason you cannot take them.  Understand all your medicine instructions.  Make sure that other conditions (such as anemia or atherosclerosis) are addressed. SEEK IMMEDIATE MEDICAL CARE IF:   You have sudden weakness or numbness of the face, arm, or leg, especially on one side of the body.  Your face or eyelid droops to one side.  You have sudden confusion.  You have trouble speaking (aphasia) or understanding.  You have sudden trouble seeing in one or both eyes.  You have sudden trouble walking.  You have dizziness.  You have a loss of balance or coordination.  You have a sudden, severe headache with no known cause.  You have new chest pain or an irregular heartbeat. Any of these symptoms may represent a serious problem that is an emergency. Do not wait to see if the symptoms will   go away. Get medical help at once. Call your local emergency services (911 in [place] Do not drive yourself to the hospital.   This information is not intended to replace advice given to you by your health care provider. Make sure you discuss any questions you have with your health care provider.   Document Released: 11/18/2004 Document Revised: 11/01/2014 Document Reviewed:  04/13/2013 Elsevier Interactive Patient Education 2016 Elsevier Inc.    Intermittent Claudication Intermittent claudication is pain in your leg that occurs when you walk or exercise and goes away when you rest. The pain can occur in one or both legs. CAUSES Intermittent claudication is caused by the buildup of plaque within the major arteries in the body (atherosclerosis). The plaque, which makes arteries stiff and narrow, prevents enough blood from reaching your leg muscles. The pain occurs when you walk or exercise because your muscles need more blood when you are moving and exercising. RISK FACTORS Risk factors include:  A family history of atherosclerosis.  A personal history of stroke or heart disease.  Older age.  Being inactive or overweight.  Smoking cigarettes.  Having another health condition such as:  Diabetes.  High blood pressure.  High cholesterol. SIGNS AND SYMPTOMS  Your hip or leg may:   Ache.  Cramp.  Feel tight.  Feel weak.  Feel heavy. Over time, you may feel pain in your calf, thigh, or hip. DIAGNOSIS  Your health care provider may diagnose intermittent claudication based on your symptoms and medical history. Your health care provider may also do tests to learn more about your condition. These may include:  Blood tests.  An ultrasound.  Imaging tests such as angiography, magnetic resonance angiography (MRA), and computed tomography angiography (CTA). TREATMENT You may be treated for problems such as:  High blood pressure.  High cholesterol.  Diabetes. Other treatments may include:  Lifestyle changes such as:  Starting an exercise program.  Losing weight.  Quitting smoking.  Medicines to help restore blood flow through your legs.  Blood vessel surgery (angioplasty) to restore blood flow if your intermittent claudication is caused by severe peripheral artery disease. HOME CARE INSTRUCTIONS  Manage any other health conditions  you have.  Eat a diet low in saturated fats and calories to maintain a healthy weight.  Quit smoking, if you smoke.  Take medicines only as directed by your health care provider.  If your health care provider recommended an exercise program for you, follow it as directed. Your exercise program may involve:  Walking three or more times a week.  Walking until you have certain symptoms of intermittent claudication.  Resting until symptoms go away.  Gradually increasing walking time to about 50 minutes a day. SEEK MEDICAL CARE IF: Your condition is not getting better or is getting worse. SEEK IMMEDIATE MEDICAL CARE IF:   You have chest pain.  You have difficulty breathing.  You develop arm weakness.  You have trouble speaking.  Your face begins to droop. MAKE SURE YOU:  Understand these instructions.  Will watch your condition.  Will get help if you are not doing well or get worse.   This information is not intended to replace advice given to you by your health care provider. Make sure you discuss any questions you have with your health care provider.   Document Released: 08/13/2004 Document Revised: 11/01/2014 Document Reviewed: 01/17/2014 Elsevier Interactive Patient Education 2016 Elsevier Inc.  

## 2016-01-05 ENCOUNTER — Ambulatory Visit (INDEPENDENT_AMBULATORY_CARE_PROVIDER_SITE_OTHER): Payer: Medicare Other | Admitting: *Deleted

## 2016-01-05 DIAGNOSIS — I639 Cerebral infarction, unspecified: Secondary | ICD-10-CM

## 2016-01-06 NOTE — Progress Notes (Signed)
Carelink Summary Report / Loop Recorder 

## 2016-02-02 ENCOUNTER — Ambulatory Visit (INDEPENDENT_AMBULATORY_CARE_PROVIDER_SITE_OTHER): Payer: Medicare Other | Admitting: *Deleted

## 2016-02-02 DIAGNOSIS — I639 Cerebral infarction, unspecified: Secondary | ICD-10-CM | POA: Diagnosis not present

## 2016-02-03 NOTE — Progress Notes (Signed)
Carelink Summary Report / Loop Recorder 

## 2016-02-14 LAB — CUP PACEART REMOTE DEVICE CHECK: Date Time Interrogation Session: 20170209210858

## 2016-02-14 NOTE — Progress Notes (Signed)
Carelink summary report received. Battery status OK. Normal device function. No new symptom episodes, tachy episodes, brady, or pause episodes. 364 AF 5.1% 1 available ECG appears SA. +Xarelto. Monthly summary reports and ROV/PRN

## 2016-02-19 ENCOUNTER — Emergency Department (HOSPITAL_COMMUNITY): Payer: Medicare Other

## 2016-02-19 ENCOUNTER — Encounter (HOSPITAL_COMMUNITY): Payer: Self-pay | Admitting: *Deleted

## 2016-02-19 ENCOUNTER — Emergency Department (HOSPITAL_COMMUNITY)
Admission: EM | Admit: 2016-02-19 | Discharge: 2016-02-19 | Disposition: A | Discharge status: Home / Self Care | Payer: Medicare Other | Attending: Emergency Medicine | Admitting: Emergency Medicine

## 2016-02-19 DIAGNOSIS — Y9389 Activity, other specified: Secondary | ICD-10-CM | POA: Diagnosis not present

## 2016-02-19 DIAGNOSIS — Z7901 Long term (current) use of anticoagulants: Secondary | ICD-10-CM | POA: Insufficient documentation

## 2016-02-19 DIAGNOSIS — S80812A Abrasion, left lower leg, initial encounter: Secondary | ICD-10-CM | POA: Insufficient documentation

## 2016-02-19 DIAGNOSIS — Z86010 Personal history of colonic polyps: Secondary | ICD-10-CM | POA: Insufficient documentation

## 2016-02-19 DIAGNOSIS — H9190 Unspecified hearing loss, unspecified ear: Secondary | ICD-10-CM | POA: Diagnosis not present

## 2016-02-19 DIAGNOSIS — Z8719 Personal history of other diseases of the digestive system: Secondary | ICD-10-CM | POA: Insufficient documentation

## 2016-02-19 DIAGNOSIS — S51811A Laceration without foreign body of right forearm, initial encounter: Secondary | ICD-10-CM | POA: Insufficient documentation

## 2016-02-19 DIAGNOSIS — S81812A Laceration without foreign body, left lower leg, initial encounter: Secondary | ICD-10-CM | POA: Insufficient documentation

## 2016-02-19 DIAGNOSIS — Y998 Other external cause status: Secondary | ICD-10-CM | POA: Diagnosis not present

## 2016-02-19 DIAGNOSIS — Z880 Allergy status to penicillin: Secondary | ICD-10-CM | POA: Insufficient documentation

## 2016-02-19 DIAGNOSIS — I4891 Unspecified atrial fibrillation: Secondary | ICD-10-CM | POA: Diagnosis not present

## 2016-02-19 DIAGNOSIS — Z8673 Personal history of transient ischemic attack (TIA), and cerebral infarction without residual deficits: Secondary | ICD-10-CM | POA: Diagnosis not present

## 2016-02-19 DIAGNOSIS — I2510 Atherosclerotic heart disease of native coronary artery without angina pectoris: Secondary | ICD-10-CM | POA: Diagnosis not present

## 2016-02-19 DIAGNOSIS — Z79899 Other long term (current) drug therapy: Secondary | ICD-10-CM | POA: Diagnosis not present

## 2016-02-19 DIAGNOSIS — Y92410 Unspecified street and highway as the place of occurrence of the external cause: Secondary | ICD-10-CM | POA: Diagnosis not present

## 2016-02-19 DIAGNOSIS — S8012XA Contusion of left lower leg, initial encounter: Secondary | ICD-10-CM

## 2016-02-19 DIAGNOSIS — I10 Essential (primary) hypertension: Secondary | ICD-10-CM | POA: Insufficient documentation

## 2016-02-19 DIAGNOSIS — S8992XA Unspecified injury of left lower leg, initial encounter: Secondary | ICD-10-CM | POA: Diagnosis present

## 2016-02-19 DIAGNOSIS — S50811A Abrasion of right forearm, initial encounter: Secondary | ICD-10-CM | POA: Diagnosis not present

## 2016-02-19 LAB — I-STAT CHEM 8, ED
BUN: 33 mg/dL — ABNORMAL HIGH (ref 6–20)
CREATININE: 1.3 mg/dL — AB (ref 0.61–1.24)
Calcium, Ion: 1.16 mmol/L (ref 1.13–1.30)
Chloride: 104 mmol/L (ref 101–111)
GLUCOSE: 101 mg/dL — AB (ref 65–99)
HCT: 43 % (ref 39.0–52.0)
HEMOGLOBIN: 14.6 g/dL (ref 13.0–17.0)
Potassium: 4.5 mmol/L (ref 3.5–5.1)
Sodium: 142 mmol/L (ref 135–145)
TCO2: 25 mmol/L (ref 0–100)

## 2016-02-19 MED ORDER — ACETAMINOPHEN 325 MG PO TABS: 650.0000 mg | ORAL_TABLET | Freq: Once | ORAL | Status: DC
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
## 2016-02-19 NOTE — Discharge Instructions (Signed)

## 2016-02-19 NOTE — ED Notes (Signed)
Cleaned skin tear and applied non stick dressing

## 2016-02-20 ENCOUNTER — Telehealth: Payer: Self-pay | Admitting: Cardiovascular Disease
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
## 2016-02-20 NOTE — Telephone Encounter (Signed)
Attempted to return call to patient's wife (DPR). Phone rang, no answer No documentation of meds being stopped after ED visit and no documentation that anyone in [facility] called

## 2016-02-23 NOTE — Telephone Encounter (Signed)
Left message with person who answered that Dr. Elmarie Shiley office was returning Mrs. Roblyer call

## 2016-03-03 ENCOUNTER — Ambulatory Visit (INDEPENDENT_AMBULATORY_CARE_PROVIDER_SITE_OTHER): Payer: Medicare Other | Admitting: *Deleted

## 2016-03-03 DIAGNOSIS — I639 Cerebral infarction, unspecified: Secondary | ICD-10-CM | POA: Diagnosis not present

## 2016-03-04 NOTE — Progress Notes (Signed)
Carelink Summary Report / Loop Recorder 

## 2016-03-17 LAB — CUP PACEART REMOTE DEVICE CHECK: MDC IDC SESS DTM: 20170311210659

## 2016-03-21 LAB — CUP PACEART REMOTE DEVICE CHECK: MDC IDC SESS DTM: 20170410213832

## 2016-03-21 NOTE — Progress Notes (Signed)
Carelink summary report received. Battery status OK. Normal device function. No new symptom episodes, tachy episodes, brady, or pause episodes. 1.3%AF, v rates controlled, +Xarelto. Monthly summary reports and ROV/PRN

## 2016-03-23 ENCOUNTER — Other Ambulatory Visit: Payer: Self-pay | Admitting: *Deleted

## 2016-03-23 MED ORDER — CARVEDILOL 3.125 MG PO TABS: 3.1250 mg | ORAL_TABLET | Freq: Two times a day (BID) | ORAL | Status: DC

## 2016-04-02 ENCOUNTER — Ambulatory Visit (INDEPENDENT_AMBULATORY_CARE_PROVIDER_SITE_OTHER): Payer: Medicare Other | Admitting: *Deleted

## 2016-04-02 DIAGNOSIS — I639 Cerebral infarction, unspecified: Secondary | ICD-10-CM | POA: Diagnosis not present

## 2016-04-05 NOTE — Progress Notes (Signed)
Carelink Summary Report / Loop Recorder 

## 2016-04-09 LAB — CUP PACEART REMOTE DEVICE CHECK
Date Time Interrogation Session: 20170609220916
MDC IDC SESS DTM: 20170510220737

## 2016-04-20 ENCOUNTER — Ambulatory Visit (INDEPENDENT_AMBULATORY_CARE_PROVIDER_SITE_OTHER): Payer: Medicare Other | Admitting: Cardiovascular Disease

## 2016-04-20 ENCOUNTER — Encounter: Payer: Self-pay | Admitting: Cardiovascular Disease

## 2016-04-20 VITALS — BP 132/64 | HR 63 | Ht 73.0 in | Wt 195.2 lb

## 2016-04-20 DIAGNOSIS — I10 Essential (primary) hypertension: Secondary | ICD-10-CM

## 2016-04-20 DIAGNOSIS — I480 Paroxysmal atrial fibrillation: Secondary | ICD-10-CM | POA: Diagnosis not present

## 2016-04-20 MED ORDER — LISINOPRIL 20 MG PO TABS: 20.0000 mg | ORAL_TABLET | Freq: Every day | ORAL | Status: AC
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
## 2016-04-20 NOTE — Patient Instructions (Signed)
Medication Instructions:  STOP Carvedilol (Coreg)   Labwork: None Ordered   Testing/Procedures: None Ordered   Follow-Up: Your physician wants you to follow-up in: 6 months with Dr. [name].  You will receive a reminder letter in the mail two months in advance. If you don't receive a letter, please call our office to schedule the follow-up appointment.   If you need a refill on your cardiac medications before your next appointment, please call your pharmacy.   Thank you for choosing [facility]! Christen Bame, RN [phone]

## 2016-05-03 ENCOUNTER — Ambulatory Visit (INDEPENDENT_AMBULATORY_CARE_PROVIDER_SITE_OTHER): Payer: Medicare Other | Admitting: *Deleted

## 2016-05-03 DIAGNOSIS — I639 Cerebral infarction, unspecified: Secondary | ICD-10-CM | POA: Diagnosis not present

## 2016-05-03 NOTE — Progress Notes (Signed)
Carelink Summary Report / Loop Recorder 

## 2016-06-01 ENCOUNTER — Ambulatory Visit (INDEPENDENT_AMBULATORY_CARE_PROVIDER_SITE_OTHER): Payer: Medicare Other | Admitting: *Deleted

## 2016-06-01 DIAGNOSIS — I639 Cerebral infarction, unspecified: Secondary | ICD-10-CM

## 2016-06-02 NOTE — Progress Notes (Signed)
Carelink Summary Report / Loop Recorder 

## 2016-06-03 LAB — CUP PACEART REMOTE DEVICE CHECK: MDC IDC SESS DTM: 20170709230646

## 2016-06-08 LAB — CUP PACEART REMOTE DEVICE CHECK: Date Time Interrogation Session: 20170808230804

## 2016-06-22 ENCOUNTER — Encounter: Payer: Self-pay | Admitting: Family

## 2016-06-30 ENCOUNTER — Encounter: Payer: Self-pay | Admitting: Family

## 2016-06-30 ENCOUNTER — Ambulatory Visit (HOSPITAL_COMMUNITY)
Admission: RE | Admit: 2016-06-30 | Discharge: 2016-06-30 | Disposition: A | Discharge status: Home / Self Care | Payer: Medicare Other | Source: Ambulatory Visit | Attending: Family | Admitting: Family

## 2016-06-30 ENCOUNTER — Ambulatory Visit (INDEPENDENT_AMBULATORY_CARE_PROVIDER_SITE_OTHER): Payer: Medicare Other | Admitting: Family

## 2016-06-30 ENCOUNTER — Ambulatory Visit (INDEPENDENT_AMBULATORY_CARE_PROVIDER_SITE_OTHER)
Admission: RE | Admit: 2016-06-30 | Discharge: 2016-06-30 | Disposition: A | Discharge status: Home / Self Care | Payer: Medicare Other | Source: Ambulatory Visit | Attending: Family | Admitting: Family

## 2016-06-30 VITALS — BP 139/76 | HR 74 | Temp 97.2°F | Resp 16 | Ht 73.0 in | Wt 197.0 lb

## 2016-06-30 DIAGNOSIS — I70213 Atherosclerosis of native arteries of extremities with intermittent claudication, bilateral legs: Secondary | ICD-10-CM | POA: Diagnosis not present

## 2016-06-30 DIAGNOSIS — I6523 Occlusion and stenosis of bilateral carotid arteries: Secondary | ICD-10-CM | POA: Diagnosis not present

## 2016-06-30 DIAGNOSIS — Z8673 Personal history of transient ischemic attack (TIA), and cerebral infarction without residual deficits: Secondary | ICD-10-CM

## 2016-06-30 DIAGNOSIS — I480 Paroxysmal atrial fibrillation: Secondary | ICD-10-CM | POA: Diagnosis not present

## 2016-06-30 DIAGNOSIS — R0989 Other specified symptoms and signs involving the circulatory and respiratory systems: Secondary | ICD-10-CM | POA: Diagnosis present

## 2016-06-30 DIAGNOSIS — I872 Venous insufficiency (chronic) (peripheral): Secondary | ICD-10-CM

## 2016-06-30 NOTE — Patient Instructions (Addendum)
Stroke Prevention Some medical conditions and behaviors are associated with an increased chance of having a stroke. You may prevent a stroke by making healthy choices and managing medical conditions. HOW CAN I REDUCE MY RISK OF HAVING A STROKE?   Stay physically active. Get at least 30 minutes of activity on most or all days.  Do not smoke. It may also be helpful to avoid exposure to secondhand smoke.  Limit alcohol use. Moderate alcohol use is considered to be:  No more than 2 drinks per day for men.  No more than 1 drink per day for nonpregnant women.  Eat healthy foods. This involves:  Eating 5 or more servings of fruits and vegetables a day.  Making dietary changes that address high blood pressure (hypertension), high cholesterol, diabetes, or obesity.  Manage your cholesterol levels.  Making food choices that are high in fiber and low in saturated fat, trans fat, and cholesterol may control cholesterol levels.  Take any prescribed medicines to control cholesterol as directed by your health care provider.  Manage your diabetes.  Controlling your carbohydrate and sugar intake is recommended to manage diabetes.  Take any prescribed medicines to control diabetes as directed by your health care provider.  Control your hypertension.  Making food choices that are low in salt (sodium), saturated fat, trans fat, and cholesterol is recommended to manage hypertension.  Ask your health care provider if you need treatment to lower your blood pressure. Take any prescribed medicines to control hypertension as directed by your health care provider.  If you are 18-39 years of age, have your blood pressure checked every 3-5 years. If you are 40 years of age or older, have your blood pressure checked every year.  Maintain a healthy weight.  Reducing calorie intake and making food choices that are low in sodium, saturated fat, trans fat, and cholesterol are recommended to manage  weight.  Stop drug abuse.  Avoid taking birth control pills.  Talk to your health care provider about the risks of taking birth control pills if you are over 35 years old, smoke, get migraines, or have ever had a blood clot.  Get evaluated for sleep disorders (sleep apnea).  Talk to your health care provider about getting a sleep evaluation if you snore a lot or have excessive sleepiness.  Take medicines only as directed by your health care provider.  For some people, aspirin or blood thinners (anticoagulants) are helpful in reducing the risk of forming abnormal blood clots that can lead to stroke. If you have the irregular heart rhythm of atrial fibrillation, you should be on a blood thinner unless there is a good reason you cannot take them.  Understand all your medicine instructions.  Make sure that other conditions (such as anemia or atherosclerosis) are addressed. SEEK IMMEDIATE MEDICAL CARE IF:   You have sudden weakness or numbness of the face, arm, or leg, especially on one side of the body.  Your face or eyelid droops to one side.  You have sudden confusion.  You have trouble speaking (aphasia) or understanding.  You have sudden trouble seeing in one or both eyes.  You have sudden trouble walking.  You have dizziness.  You have a loss of balance or coordination.  You have a sudden, severe headache with no known cause.  You have new chest pain or an irregular heartbeat. Any of these symptoms may represent a serious problem that is an emergency. Do not wait to see if the symptoms will   go away. Get medical help at once. Call your local emergency services (911 in [place] Do not drive yourself to the hospital.   This information is not intended to replace advice given to you by your health care provider. Make sure you discuss any questions you have with your health care provider.   Document Released: 11/18/2004 Document Revised: 11/01/2014 Document Reviewed:  04/13/2013 Elsevier Interactive Patient Education 2016 [employer]     Peripheral Vascular Disease Peripheral vascular disease (PVD) is a disease of the blood vessels that are not part of your heart and brain. A simple term for PVD is poor circulation. In most cases, PVD narrows the blood vessels that carry blood from your heart to the rest of your body. This can result in a decreased supply of blood to your arms, legs, and internal organs, like your stomach or kidneys. However, it most often affects a person's lower legs and feet. There are two types of PVD.  Organic PVD. This is the more common type. It is caused by damage to the structure of blood vessels.  Functional PVD. This is caused by conditions that make blood vessels contract and tighten (spasm). Without treatment, PVD tends to get worse over time. PVD can also lead to acute ischemic limb. This is when an arm or limb suddenly has trouble getting enough blood. This is a medical emergency. CAUSES Each type of PVD has many different causes. The most common cause of PVD is buildup of a fatty material (plaque) inside of your arteries (atherosclerosis). Small amounts of plaque can break off from the walls of the blood vessels and become lodged in a smaller artery. This blocks blood flow and can cause acute ischemic limb. Other common causes of PVD include:  Blood clots that form inside of blood vessels.  Injuries to blood vessels.  Diseases that cause inflammation of blood vessels or cause blood vessel spasms.  Health behaviors and health history that increase your risk of developing PVD. RISK FACTORS  You may have a greater risk of PVD if you:  Have a family history of PVD.  Have certain medical conditions, including:  High cholesterol.  Diabetes.  High blood pressure (hypertension).  Coronary heart disease.  Past problems with blood clots.  Past injury, such as burns or a broken bone. These may have damaged blood  vessels in your limbs.  Buerger disease. This is caused by inflamed blood vessels in your hands and feet.  Some forms of arthritis.  Rare birth defects that affect the arteries in your legs.  Use tobacco.  Do not get enough exercise.  Are obese.  Are age 34 or older. SIGNS AND SYMPTOMS  PVD may cause many different symptoms. Your symptoms depend on what part of your body is not getting enough blood. Some common signs and symptoms include:  Cramps in your lower legs. This may be a symptom of poor leg circulation (claudication).  Pain and weakness in your legs while you are physically active that goes away when you rest (intermittent claudication).  Leg pain when at rest.  Leg numbness, tingling, or weakness.  Coldness in a leg or foot, especially when compared with the other leg.  Skin or hair changes. These can include:  Hair loss.  Shiny skin.  Pale or bluish skin.  Thick toenails.  Inability to get or maintain an erection (erectile dysfunction). People with PVD are more prone to developing ulcers and sores on their toes, feet, or legs. These may take longer  normal to heal. DIAGNOSIS Your health care provider may diagnose PVD from your signs and symptoms. The health care provider will also do a physical exam. You may have tests to find out what is causing your PVD and determine its severity. Tests may include:  Blood pressure recordings from your arms and legs and measurements of the strength of your pulses (pulse volume recordings).  Imaging studies using sound waves to take pictures of the blood flow through your blood vessels (Doppler ultrasound).  Injecting a dye into your blood vessels before having imaging studies using:  X-rays (angiogram or arteriogram).  Computer-generated X-rays (CT angiogram).  A powerful electromagnetic field and a computer (magnetic resonance angiogram or MRA). TREATMENT Treatment for PVD depends on the cause of your condition  and the severity of your symptoms. It also depends on your age. Underlying causes need to be treated and controlled. These include long-lasting (chronic) conditions, such as diabetes, high cholesterol, and high blood pressure. You may need to first try making lifestyle changes and taking medicines. Surgery may be needed if these do not work. Lifestyle changes may include:  Quitting smoking.  Exercising regularly.  Following a low-fat, low-cholesterol diet. Medicines may include:  Blood thinners to prevent blood clots.  Medicines to improve blood flow.  Medicines to improve your blood cholesterol levels. Surgical procedures may include:  A procedure that uses an inflated balloon to open a blocked artery and improve blood flow (angioplasty).  A procedure to put in a tube (stent) to keep a blocked artery open (stent implant).  Surgery to reroute blood flow around a blocked artery (peripheral bypass surgery).  Surgery to remove dead tissue from an infected wound on the affected limb.  Amputation. This is surgical removal of the affected limb. This may be necessary in cases of acute ischemic limb that are not improved through medical or surgical treatments. HOME CARE INSTRUCTIONS  Take medicines only as directed by your health care provider.  Do not use any tobacco products, including cigarettes, chewing tobacco, or electronic cigarettes. If you need help quitting, ask your health care provider.  Lose weight if you are overweight, and maintain a healthy weight as directed by your health care provider.  Eat a diet that is low in fat and cholesterol. If you need help, ask your health care provider.  Exercise regularly. Ask your health care provider to suggest some good activities for you.  Use compression stockings or other mechanical devices as directed by your health care provider.  Take good care of your feet.  Wear comfortable shoes that fit well.  Check your feet often for  any cuts or sores. SEEK MEDICAL CARE IF:  You have cramps in your legs while walking.  You have leg pain when you are at rest.  You have coldness in a leg or foot.  Your skin changes.  You have erectile dysfunction.  You have cuts or sores on your feet that are not healing. SEEK IMMEDIATE MEDICAL CARE IF:  Your arm or leg turns cold and blue.  Your arms or legs become red, warm, swollen, painful, or numb.  You have chest pain or trouble breathing.  You suddenly have weakness in your face, arm, or leg.  You become very confused or lose the ability to speak.  You suddenly have a very bad headache or lose your vision.   This information is not intended to replace advice given to you by your health care provider. Make sure you discuss any questions   questions you have with your health care provider.   Document Released: 11/18/2004 Document Revised: 11/01/2014 Document Reviewed: 03/21/2014 Elsevier Interactive Patient Education 2016 [employer]     Venous Stasis or Chronic Venous Insufficiency Chronic venous insufficiency, also called venous stasis, is a condition that affects the veins in the legs. The condition prevents blood from being pumped through these veins effectively. Blood may no longer be pumped effectively from the legs back to the heart. This condition can range from mild to severe. With proper treatment, you should be able to continue with an active life. CAUSES  Chronic venous insufficiency occurs when the vein walls become stretched, weakened, or damaged or when valves within the vein are damaged. Some common causes of this include:  High blood pressure inside the veins (venous hypertension).  Increased blood pressure in the leg veins from long periods of sitting or standing.  A blood clot that blocks blood flow in a vein (deep vein thrombosis).  Inflammation of a superficial vein (phlebitis) that causes a blood clot to form. RISK FACTORS Various things can make  you more likely to develop chronic venous insufficiency, including:  Family history of this condition.  Obesity.  Pregnancy.  Sedentary lifestyle.  Smoking.  Jobs requiring long periods of standing or sitting in one place.  Being a certain age. Women in their 3s and 17s and men in their 4s are more likely to develop this condition. SIGNS AND SYMPTOMS  Symptoms may include:   Varicose veins.  Skin breakdown or ulcers.  Reddened or discolored skin on the leg.  Brown, smooth, tight, and painful skin just above the ankle, usually on the inside surface (lipodermatosclerosis).  Swelling. DIAGNOSIS  To diagnose this condition, your health care provider will take a medical history and do a physical exam. The following tests may be ordered to confirm the diagnosis:  Duplex ultrasound--A procedure that produces a picture of a blood vessel and nearby organs and also provides information on blood flow through the blood vessel.  Plethysmography--A procedure that tests blood flow.  A venogram, or venography--A procedure used to look at the veins using X-ray and dye. TREATMENT The goals of treatment are to help you return to an active life and to minimize pain or disability. Treatment will depend on the severity of the condition. Medical procedures may be needed for severe cases. Treatment options may include:   Use of compression stockings. These can help with symptoms and lower the chances of the problem getting worse, but they do not cure the problem.  Sclerotherapy--A procedure involving an injection of a material that "dissolves" the damaged veins. Other veins in the network of blood vessels take over the function of the damaged veins.  Surgery to remove the vein or cut off blood flow through the vein (vein stripping or laser ablation surgery).  Surgery to repair a valve. HOME CARE INSTRUCTIONS   Wear compression stockings as directed by your health care provider.  Only take  over-the-counter or prescription medicines for pain, discomfort, or fever as directed by your health care provider.  Follow up with your health care provider as directed. SEEK MEDICAL CARE IF:   You have redness, swelling, or increasing pain in the affected area.  You see a red streak or line that extends up or down from the affected area.  You have a breakdown or loss of skin in the affected area, even if the breakdown is small.  You have an injury to the affected area. SEEK  IMMEDIATE MEDICAL CARE IF:   You have an injury and open wound in the affected area.  Your pain is severe and does not improve with medicine.  You have sudden numbness or weakness in the foot or ankle below the affected area, or you have trouble moving your foot or ankle.  You have a fever or persistent symptoms for more than 2-3 days.  You have a fever and your symptoms suddenly get worse. MAKE SURE YOU:   Understand these instructions.  Will watch your condition.  Will get help right away if you are not doing well or get worse.   This information is not intended to replace advice given to you by your health care provider. Make sure you discuss any questions you have with your health care provider.   Document Released: 02/14/2007 Document Revised: 08/01/2013 Document Reviewed: 06/18/2013 [employer]vier Interactive Patient Education [employer].

## 2016-06-30 NOTE — Progress Notes (Signed)
[facility] [place] HISTORY AND PHYSICAL   MRN : [id number]  History of Present Illness:   Manuel [name] is a 82 y.o. male patient of Dr. Scot Dock who presents for follow up of known extracranial carotid artery stenosis and peripheral artery occlusive disease.  He had a neurological evaluation of his lumbar spine issues and left foot drop, but states no help could be offered.   He had a TIA July, 2015, as manifested by right leg weakness, started on Eliquis by Dr. [name]. Dr. Elmarie Shiley note dated 08/13/14: He was recently found to have paroxysmal atrial fibrillation on his implantable loop recorder. I think that this probably explains his stroke. At this point we'll discontinue the Plavix and start him on Eliquis, which was changed to Xarelto.   He had a cardiac work up.  He expresses discouragement at not being able to walk much due to foot drop, low back pain, and sciatic type pain affecting legs. He had c/o claudication type pain in both calves after walking 1 block, relieved by rest. Both calves also hurt after standing about 10-15 minutes.    He had a URI December 2015, treated.   Pt Diabetic: No  Pt smoker: non-smoker   Pt meds include:  Statin : No: has never had elevated cholesterol  ASA: No: had GI bleed and was stopped  Other anticoagulants/antiplatelets: on Eliquis, changed to Xarelto since his TIA July 2015     Current Outpatient Prescriptions  Medication Sig Dispense Refill  . acetaminophen (TYLENOL) 650 MG CR tablet Take 650 mg by mouth every 8 (eight) hours as needed for pain (give with docusate for stool softner).    . Cholecalciferol (VITAMIN D) 2000 UNITS tablet Take 2,000 Units by mouth daily.    . Coenzyme Q10 (COQ10) 200 MG CAPS Take 200 mg by mouth daily.    . furosemide (LASIX) 20 MG tablet Take 1 tablet (20 mg total) by mouth daily. Take one tablet (20 mg) by mouth once every other day 90 tablet 3  . lisinopril  (PRINIVIL,ZESTRIL) 20 MG tablet Take 1 tablet (20 mg total) by mouth daily. 90 tablet 3  . Multiple Vitamin (MULTIVITAMIN WITH MINERALS) TABS tablet Take 1 tablet by mouth daily. NutraLite    . OMEGA 3-6-9 FATTY ACIDS PO Take 1 capsule by mouth daily.    . rivaroxaban (XARELTO) 20 MG TABS tablet Take 1 tablet (20 mg total) by mouth daily with supper. 90 tablet 2  . vitamin C (ASCORBIC ACID) 500 MG tablet Take 250 mg by mouth at bedtime.     . diphenhydrAMINE (SOMINEX) 25 MG tablet Take 50 mg by mouth at bedtime. Reported on 04/20/2016    . docusate sodium (COLACE) 100 MG capsule Take 100 mg by mouth daily as needed for mild constipation (give with tylenol arthritis for a stool softner). Reported on 04/20/2016    . HYDROcodone-acetaminophen (NORCO/VICODIN) 5-325 MG per tablet Take 1 tablet by mouth every 4 (four) hours as needed for moderate pain. (Patient not taking: Reported on 06/30/2016) 20 tablet 0  . omeprazole (PRILOSEC) 40 MG capsule Take 40 mg by mouth every morning. Reported on 12/24/2015    . ondansetron (ZOFRAN ODT) 8 MG disintegrating tablet Take 1 tablet (8 mg total) by mouth every 8 (eight) hours as needed for nausea or vomiting. (Patient not taking: Reported on 06/30/2016) 10 tablet 0   No current facility-administered medications for this visit.     Past Medical History:  Diagnosis Date  .  Atrial fibrillation ([place])   . CAD (coronary artery disease)   . Carotid artery occlusion   . Colon polyps   . Gastric hemorrhage   . Hearing loss   . Hypertension   . Mallory-Weiss tear 2011   treated with 6 units PRBCs. No longer on aspirin  . Peripheral neuropathy ([place])   . Rectal bleed   . Stroke [facility]) July 2015   Right Leg Weakness  . Zenker diverticulum     Social History Social History  Substance Use Topics  . Smoking status: Never Smoker  . Smokeless tobacco: Never Used  . Alcohol use No    Family History Family History  Problem Relation Age of Onset  . Cancer Mother 47     Liver  . Heart attack Father     Deceased, 58  . Heart disease Father     After age 45  . Heart disease Sister     Heart Disease before age 90  . Cancer Sister   . Diabetes Son   . Parkinson's disease Sister   . Stroke Other     Deceased, siblings  . Heart disease Other     Deceased, siblings  . Heart disease Brother     After age 28  . Cancer Brother     Prostate  . Heart disease Brother     After age 28    Surgical History Past Surgical History:  Procedure Laterality Date  . ABDOMINAL SURGERY    . APPENDECTOMY    . BACK SURGERY    . CHOLECYSTECTOMY  1999   Gall Bladder  . HERNIA REPAIR    . LOOP RECORDER IMPLANT  05/13/14   MDT LINQ implanted by Dr Caryl Comes for cryptogenic stroke  . LOOP RECORDER IMPLANT N/A 05/13/2014   Procedure: LOOP RECORDER IMPLANT;  Surgeon: Deboraha Sprang, MD;  Location: [facility] CATH LAB;  Service: Cardiovascular;  Laterality: N/A;    Allergies  Allergen Reactions  . Adhesive [Tape]     Plastic tape= tears skin  . Ivp Dye [Iodinated Diagnostic Agents] Other (See Comments)    Doesn't remember reaction but it was severe  . Penicillins Hives and Itching  . Rofecoxib Other (See Comments)     elevated BP (Vioxx)  . Sulfamethoxazole Nausea And Vomiting  . Sulfonamide Derivatives Hives and Itching  . Codeine Other (See Comments) and Hives    Pass out    Current Outpatient Prescriptions  Medication Sig Dispense Refill  . acetaminophen (TYLENOL) 650 MG CR tablet Take 650 mg by mouth every 8 (eight) hours as needed for pain (give with docusate for stool softner).    . Cholecalciferol (VITAMIN D) 2000 UNITS tablet Take 2,000 Units by mouth daily.    . Coenzyme Q10 (COQ10) 200 MG CAPS Take 200 mg by mouth daily.    . furosemide (LASIX) 20 MG tablet Take 1 tablet (20 mg total) by mouth daily. Take one tablet (20 mg) by mouth once every other day 90 tablet 3  . lisinopril (PRINIVIL,ZESTRIL) 20 MG tablet Take 1 tablet (20 mg total) by mouth daily. 90 tablet  3  . Multiple Vitamin (MULTIVITAMIN WITH MINERALS) TABS tablet Take 1 tablet by mouth daily. NutraLite    . OMEGA 3-6-9 FATTY ACIDS PO Take 1 capsule by mouth daily.    . rivaroxaban (XARELTO) 20 MG TABS tablet Take 1 tablet (20 mg total) by mouth daily with supper. 90 tablet 2  . vitamin C (ASCORBIC ACID) 500 MG tablet Take  250 mg by mouth at bedtime.     . diphenhydrAMINE (SOMINEX) 25 MG tablet Take 50 mg by mouth at bedtime. Reported on 04/20/2016    . docusate sodium (COLACE) 100 MG capsule Take 100 mg by mouth daily as needed for mild constipation (give with tylenol arthritis for a stool softner). Reported on 04/20/2016    . HYDROcodone-acetaminophen (NORCO/VICODIN) 5-325 MG per tablet Take 1 tablet by mouth every 4 (four) hours as needed for moderate pain. (Patient not taking: Reported on 06/30/2016) 20 tablet 0  . omeprazole (PRILOSEC) 40 MG capsule Take 40 mg by mouth every morning. Reported on 12/24/2015    . ondansetron (ZOFRAN ODT) 8 MG disintegrating tablet Take 1 tablet (8 mg total) by mouth every 8 (eight) hours as needed for nausea or vomiting. (Patient not taking: Reported on 06/30/2016) 10 tablet 0   No current facility-administered medications for this visit.      REVIEW OF SYSTEMS: See HPI for pertinent positives and negatives.  Physical Examination Vitals:   06/30/16 0958  BP: 139/76  Pulse: 74  Resp: 16  Temp: 97.2 F (36.2 C)  SpO2: 94%  Weight: 197 lb (89.4 kg)  Height: 6\' 1"  (1.854 m)   Body mass index is 25.99 kg/m.  General: WDWN male in NAD  GAIT: left foot drop, using motorized scooter Eyes: PERRLA  Pulmonary: Respirations are non-labored, CTAB  Cardiac: Regular rhythm with occasional premature and dropped contractions  VASCULAR EXAM  Carotid Bruits  Left  Right    Negative  Negative   Aorta is not palpable.  Radial pulses are 2+ palpable and equal.   LE Pulses  LEFT  RIGHT   FEMORAL  palpable  Not palpable   POPLITEAL   not palpable  not palpable   POSTERIOR TIBIAL  not palpable  not palpable   DORSALIS PEDIS  ANTERIOR TIBIAL  Not palpable  Not palpable    Gastrointestinal: soft, nontender, BS WNL, no r/g, no palpable masses.   Musculoskeletal: No muscle atrophy/wasting. M/S 4/5 in arms, 4/5 in legs, Extremities without ischemic changes. He does have 1+ pretibial pitting edema in the right lower leg and trace in the left lower leg. Both lower legs have hemosiderin deposits and chronic venous stasis dermatitis, no ulcers. Soles of feet are slightly ruddy.   Neurologic: A&O X 3; Appropriate Affect,  Speech is normal  CN 2-12 intact except for some hearing loss, Pain and light touch intact in extremities, Motor exam as listed above. Left foot drop with walking.     Non-Invasive Vascular Imaging:   ASSESSMENT:  Manuel [name] is a 82 y.o. male who had a stroke in July 2015, thought by his cardiologist to be due to recent paroxysmal atrial fibrillation, Eliquis was started, Plavix was stopped. Pt has had no known subsequent stroke or TIA.  12/24/15 carotid duplex suggested 40-59% right ICA stenosis and <40% left ICA stenosis.  No significant change in comparison to the previous exam.  ABI's today indicate worsening arterial occlusive disease in both lower extremities compared to 12/24/15: 38% in the right compared to 43%, 62% in the left compared to 82%,TBI's are absent, waveforms in both legs have diminished from bi to monophasic.  He no longer walks enough to elicit claudication, he has balance and falling issues, left knee pain. He has no signs of ischemia in his feet/legs.  Pt states he has no problem with falling as he uses his motorized scooter.    PLAN:   Daily  seated leg exercises as demonstrated and discussed.   Based on today's exam and non-invasive vascular lab results, the patient will follow up in 6 months with the following tests: carotid duplex and  ABI's. I discussed in depth with the patient the nature of atherosclerosis, and emphasized the importance of maximal medical management including strict control of blood pressure, blood glucose, and lipid levels, obtaining regular exercise, and cessation of smoking.  The patient is aware that without maximal medical management the underlying atherosclerotic disease process will progress, limiting the benefit of any interventions.  The patient was given information about stroke prevention and what symptoms should prompt the patient to seek immediate medical care.  The patient was given information about PAD including signs, symptoms, treatment, what symptoms should prompt the patient to seek immediate medical care, and risk reduction measures to take. Thank you for allowing [place] to participate in this patient's care.  Clemon Chambers, RN, MSN, FNP-C [facility] Office: [phone]  Clinic MD: [facility] 06/30/2016 10:06 AM

## 2016-07-01 ENCOUNTER — Ambulatory Visit (INDEPENDENT_AMBULATORY_CARE_PROVIDER_SITE_OTHER): Payer: Medicare Other | Admitting: *Deleted

## 2016-07-01 DIAGNOSIS — I639 Cerebral infarction, unspecified: Secondary | ICD-10-CM | POA: Diagnosis not present

## 2016-07-01 DIAGNOSIS — I480 Paroxysmal atrial fibrillation: Secondary | ICD-10-CM

## 2016-07-02 NOTE — Progress Notes (Signed)
Carelink Summary Report / Loop Recorder 

## 2016-07-13 ENCOUNTER — Encounter: Payer: Self-pay | Admitting: Physician Assistant
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
## 2016-07-14 ENCOUNTER — Encounter: Payer: Self-pay | Admitting: Physician Assistant

## 2016-07-14 ENCOUNTER — Ambulatory Visit (INDEPENDENT_AMBULATORY_CARE_PROVIDER_SITE_OTHER): Payer: Medicare Other | Admitting: Physician Assistant

## 2016-07-14 VITALS — BP 138/80 | HR 78 | Ht 73.0 in | Wt 198.0 lb

## 2016-07-14 DIAGNOSIS — I10 Essential (primary) hypertension: Secondary | ICD-10-CM

## 2016-07-14 DIAGNOSIS — Z79899 Other long term (current) drug therapy: Secondary | ICD-10-CM

## 2016-07-14 DIAGNOSIS — I480 Paroxysmal atrial fibrillation: Secondary | ICD-10-CM | POA: Diagnosis not present

## 2016-07-14 LAB — CBC WITH DIFFERENTIAL/PLATELET
BASOS ABS: 53 {cells}/uL (ref 0–200)
BASOS PCT: 1 %
EOS ABS: 212 {cells}/uL (ref 15–500)
Eosinophils Relative: 4 %
HEMATOCRIT: 38.5 % (ref 38.5–50.0)
Hemoglobin: 12.7 g/dL — ABNORMAL LOW (ref 13.2–17.1)
LYMPHS PCT: 27 %
Lymphs Abs: 1431 cells/uL (ref 850–3900)
MCH: 28.5 pg (ref 27.0–33.0)
MCHC: 33 g/dL (ref 32.0–36.0)
MCV: 86.5 fL (ref 80.0–100.0)
MONO ABS: 795 {cells}/uL (ref 200–950)
MONOS PCT: 15 %
MPV: 11.1 fL (ref 7.5–12.5)
NEUTROS PCT: 53 %
Neutro Abs: 2809 cells/uL (ref 1500–7800)
PLATELETS: 202 10*3/uL (ref 140–400)
RBC: 4.45 MIL/uL (ref 4.20–5.80)
RDW: 14.5 % (ref 11.0–15.0)
WBC: 5.3 10*3/uL (ref 3.8–10.8)

## 2016-07-14 NOTE — Patient Instructions (Addendum)
Medication Instructions:  Your physician recommends that you continue on your current medications as directed. Please refer to the Current Medication list given to you today.   Labwork: TODAY: BMET/CBC  Testing/Procedures: None Ordered   Follow-Up: Your physician wants you schedule an appointment Dr. [name] for December 2017 and follow-up with Dr. Caryl Comes in 1 year.  You will receive a reminder letter in the mail two months in advance. If you don't receive a letter, please call our office to schedule the follow-up appointment.   Any Other Special Instructions Will Be Listed Below (If Applicable).     If you need a refill on your cardiac medications before your next appointment, please call your pharmacy.

## 2016-07-15 LAB — BASIC METABOLIC PANEL
BUN: 25 mg/dL (ref 7–25)
CHLORIDE: 107 mmol/L (ref 98–110)
CO2: 24 mmol/L (ref 20–31)
CREATININE: 1.11 mg/dL (ref 0.70–1.11)
Calcium: 9.3 mg/dL (ref 8.6–10.3)
GLUCOSE: 86 mg/dL (ref 65–99)
POTASSIUM: 4.6 mmol/L (ref 3.5–5.3)
Sodium: 139 mmol/L (ref 135–146)

## 2016-07-20 ENCOUNTER — Telehealth: Payer: Self-pay | Admitting: *Deleted

## 2016-07-20 NOTE — Telephone Encounter (Signed)
-----   Message from [facility], [place] sent at 07/15/2016  4:54 PM EDT ----- Please let the patient know his labs look stable/good, no changes.  Thanks [employer]

## 2016-07-20 NOTE — Telephone Encounter (Signed)
LMOVM  WITH RESIDENT WITH NUMBER AND NAME Taggart Prasad TO ASK FOR  TO CALL BACK OFFICE FOR  RESULTS

## 2016-07-20 NOTE — Telephone Encounter (Signed)
ERROR

## 2016-07-20 NOTE — Telephone Encounter (Signed)
-----   Message from [facility], [place] sent at 07/15/2016  4:54 PM EDT ----- Please let the patient know his labs look stable/good, no changes.  Thanks [employer]

## 2016-07-22 ENCOUNTER — Telehealth: Payer: Self-pay | Admitting: *Deleted

## 2016-07-22 NOTE — Telephone Encounter (Signed)
-----   Message from [facility], [place] sent at 07/15/2016  4:54 PM EDT ----- Please let the patient know his labs look stable/good, no changes.  Thanks [employer]

## 2016-07-22 NOTE — Telephone Encounter (Signed)
SPOKE TO PT ABOUT RESULTS AND VERBALIZED UNDERSTANDING  

## 2016-07-31 LAB — CUP PACEART REMOTE DEVICE CHECK: MDC IDC SESS DTM: 20170907233726

## 2016-07-31 NOTE — Progress Notes (Signed)
Carelink summary report received. Battery status OK. Normal device function. No new symptom episodes, tachy episodes, brady, or pause episodes. 6% AF, +Xarelto, V rates controlled. Monthly summary reports and ROV/PRN

## 2016-08-01 ENCOUNTER — Other Ambulatory Visit: Payer: Self-pay | Admitting: Cardiovascular Disease

## 2016-08-02 ENCOUNTER — Ambulatory Visit (INDEPENDENT_AMBULATORY_CARE_PROVIDER_SITE_OTHER): Payer: Medicare Other | Admitting: *Deleted

## 2016-08-02 DIAGNOSIS — I639 Cerebral infarction, unspecified: Secondary | ICD-10-CM | POA: Diagnosis not present

## 2016-08-02 NOTE — Progress Notes (Signed)
Carelink Summary Report / Loop Recorder 

## 2016-08-30 ENCOUNTER — Ambulatory Visit (INDEPENDENT_AMBULATORY_CARE_PROVIDER_SITE_OTHER): Payer: Medicare Other | Admitting: *Deleted

## 2016-08-30 DIAGNOSIS — I639 Cerebral infarction, unspecified: Secondary | ICD-10-CM

## 2016-08-31 NOTE — Progress Notes (Signed)
Carelink Summary Report / Loop Recorder 

## 2016-09-05 LAB — CUP PACEART REMOTE DEVICE CHECK
Date Time Interrogation Session: 20171008003736
MDC IDC PG IMPLANT DT: 20150720

## 2016-09-05 NOTE — Progress Notes (Signed)
Carelink summary report received. Battery status OK. Normal device function. No new symptom episodes, tachy episodes, brady, or pause episodes. 2.4% AF, +Xarelto. Monthly summary reports and ROV/PRN

## 2016-09-26 ENCOUNTER — Encounter (HOSPITAL_COMMUNITY): Payer: Self-pay | Admitting: Emergency Medicine

## 2016-09-26 ENCOUNTER — Other Ambulatory Visit: Payer: Self-pay

## 2016-09-26 DIAGNOSIS — R34 Anuria and oliguria: Secondary | ICD-10-CM | POA: Diagnosis present

## 2016-09-26 DIAGNOSIS — Z886 Allergy status to analgesic agent status: Secondary | ICD-10-CM

## 2016-09-26 DIAGNOSIS — Z8719 Personal history of other diseases of the digestive system: Secondary | ICD-10-CM

## 2016-09-26 DIAGNOSIS — I2510 Atherosclerotic heart disease of native coronary artery without angina pectoris: Secondary | ICD-10-CM | POA: Diagnosis present

## 2016-09-26 DIAGNOSIS — I213 ST elevation (STEMI) myocardial infarction of unspecified site: Secondary | ICD-10-CM | POA: Diagnosis not present

## 2016-09-26 DIAGNOSIS — I255 Ischemic cardiomyopathy: Secondary | ICD-10-CM | POA: Diagnosis present

## 2016-09-26 DIAGNOSIS — Z885 Allergy status to narcotic agent status: Secondary | ICD-10-CM

## 2016-09-26 DIAGNOSIS — I482 Chronic atrial fibrillation: Secondary | ICD-10-CM | POA: Diagnosis present

## 2016-09-26 DIAGNOSIS — R570 Cardiogenic shock: Secondary | ICD-10-CM | POA: Diagnosis not present

## 2016-09-26 DIAGNOSIS — K5900 Constipation, unspecified: Secondary | ICD-10-CM | POA: Diagnosis present

## 2016-09-26 DIAGNOSIS — H9190 Unspecified hearing loss, unspecified ear: Secondary | ICD-10-CM | POA: Diagnosis present

## 2016-09-26 DIAGNOSIS — Z823 Family history of stroke: Secondary | ICD-10-CM

## 2016-09-26 DIAGNOSIS — D72829 Elevated white blood cell count, unspecified: Secondary | ICD-10-CM | POA: Diagnosis present

## 2016-09-26 DIAGNOSIS — Z8673 Personal history of transient ischemic attack (TIA), and cerebral infarction without residual deficits: Secondary | ICD-10-CM

## 2016-09-26 DIAGNOSIS — Z86010 Personal history of colonic polyps: Secondary | ICD-10-CM

## 2016-09-26 DIAGNOSIS — Z91041 Radiographic dye allergy status: Secondary | ICD-10-CM

## 2016-09-26 DIAGNOSIS — Z882 Allergy status to sulfonamides status: Secondary | ICD-10-CM

## 2016-09-26 DIAGNOSIS — Z8249 Family history of ischemic heart disease and other diseases of the circulatory system: Secondary | ICD-10-CM

## 2016-09-26 DIAGNOSIS — R739 Hyperglycemia, unspecified: Secondary | ICD-10-CM | POA: Diagnosis present

## 2016-09-26 DIAGNOSIS — Z833 Family history of diabetes mellitus: Secondary | ICD-10-CM

## 2016-09-26 DIAGNOSIS — I472 Ventricular tachycardia, unspecified: Secondary | ICD-10-CM

## 2016-09-26 DIAGNOSIS — G629 Polyneuropathy, unspecified: Secondary | ICD-10-CM | POA: Diagnosis present

## 2016-09-26 DIAGNOSIS — N189 Chronic kidney disease, unspecified: Secondary | ICD-10-CM | POA: Diagnosis present

## 2016-09-26 DIAGNOSIS — Z9049 Acquired absence of other specified parts of digestive tract: Secondary | ICD-10-CM

## 2016-09-26 DIAGNOSIS — R079 Chest pain, unspecified: Secondary | ICD-10-CM | POA: Diagnosis present

## 2016-09-26 DIAGNOSIS — I129 Hypertensive chronic kidney disease with stage 1 through stage 4 chronic kidney disease, or unspecified chronic kidney disease: Secondary | ICD-10-CM | POA: Diagnosis present

## 2016-09-26 DIAGNOSIS — I454 Nonspecific intraventricular block: Secondary | ICD-10-CM | POA: Diagnosis present

## 2016-09-26 DIAGNOSIS — Z79899 Other long term (current) drug therapy: Secondary | ICD-10-CM

## 2016-09-26 DIAGNOSIS — I469 Cardiac arrest, cause unspecified: Secondary | ICD-10-CM | POA: Diagnosis not present

## 2016-09-26 DIAGNOSIS — N179 Acute kidney failure, unspecified: Secondary | ICD-10-CM | POA: Diagnosis present

## 2016-09-26 DIAGNOSIS — E872 Acidosis: Secondary | ICD-10-CM | POA: Diagnosis present

## 2016-09-26 DIAGNOSIS — Z880 Allergy status to penicillin: Secondary | ICD-10-CM

## 2016-09-26 DIAGNOSIS — K219 Gastro-esophageal reflux disease without esophagitis: Secondary | ICD-10-CM | POA: Diagnosis present

## 2016-09-26 DIAGNOSIS — Z7901 Long term (current) use of anticoagulants: Secondary | ICD-10-CM

## 2016-09-26 DIAGNOSIS — Z9109 Other allergy status, other than to drugs and biological substances: Secondary | ICD-10-CM

## 2016-09-26 DIAGNOSIS — R9431 Abnormal electrocardiogram [ECG] [EKG]: Secondary | ICD-10-CM | POA: Diagnosis not present

## 2016-09-26 DIAGNOSIS — I2119 ST elevation (STEMI) myocardial infarction involving other coronary artery of inferior wall: Secondary | ICD-10-CM | POA: Diagnosis present

## 2016-09-26 LAB — BASIC METABOLIC PANEL
ANION GAP: 10 (ref 5–15)
BUN: 45 mg/dL — ABNORMAL HIGH (ref 6–20)
CALCIUM: 8.8 mg/dL — AB (ref 8.9–10.3)
CHLORIDE: 105 mmol/L (ref 101–111)
CO2: 21 mmol/L — ABNORMAL LOW (ref 22–32)
CREATININE: 2.6 mg/dL — AB (ref 0.61–1.24)
GFR calc non Af Amer: 20 mL/min — ABNORMAL LOW (ref >60)
GFR, EST AFRICAN AMERICAN: 23 mL/min — AB (ref >60)
Glucose, Bld: 154 mg/dL — ABNORMAL HIGH (ref 65–99)
Potassium: 4.4 mmol/L (ref 3.5–5.1)
SODIUM: 136 mmol/L (ref 135–145)

## 2016-09-26 LAB — I-STAT CHEM 8, ED
BUN: 40 mg/dL — AB (ref 6–20)
CALCIUM ION: 1.11 mmol/L — AB (ref 1.15–1.40)
Chloride: 107 mmol/L (ref 101–111)
Creatinine, Ser: 2.2 mg/dL — ABNORMAL HIGH (ref 0.61–1.24)
Glucose, Bld: 149 mg/dL — ABNORMAL HIGH (ref 65–99)
HEMATOCRIT: 38 % — AB (ref 39.0–52.0)
HEMOGLOBIN: 12.9 g/dL — AB (ref 13.0–17.0)
Potassium: 4.4 mmol/L (ref 3.5–5.1)
Sodium: 139 mmol/L (ref 135–145)
TCO2: 19 mmol/L (ref 0–100)

## 2016-09-26 LAB — CBC WITH DIFFERENTIAL/PLATELET
BASOS ABS: 0 10*3/uL (ref 0.0–0.1)
BASOS PCT: 0 %
EOS ABS: 0 10*3/uL (ref 0.0–0.7)
Eosinophils Relative: 0 %
HEMATOCRIT: 37.5 % — AB (ref 39.0–52.0)
HEMOGLOBIN: 12.2 g/dL — AB (ref 13.0–17.0)
Lymphocytes Relative: 8 %
Lymphs Abs: 1.3 10*3/uL (ref 0.7–4.0)
MCH: 28.6 pg (ref 26.0–34.0)
MCHC: 32.5 g/dL (ref 30.0–36.0)
MCV: 87.8 fL (ref 78.0–100.0)
MONOS PCT: 15 %
Monocytes Absolute: 2.4 10*3/uL — ABNORMAL HIGH (ref 0.1–1.0)
NEUTROS ABS: 12.2 10*3/uL — AB (ref 1.7–7.7)
NEUTROS PCT: 77 %
Platelets: 177 10*3/uL (ref 150–400)
RBC: 4.27 MIL/uL (ref 4.22–5.81)
RDW: 15 % (ref 11.5–15.5)
WBC: 15.9 10*3/uL — ABNORMAL HIGH (ref 4.0–10.5)

## 2016-09-26 LAB — APTT: APTT: 32 s (ref 24–36)

## 2016-09-26 LAB — I-STAT TROPONIN, ED

## 2016-09-26 LAB — PROTIME-INR
INR: 1.29
Prothrombin Time: 16.2 seconds — ABNORMAL HIGH (ref 11.4–15.2)

## 2016-09-26 LAB — BRAIN NATRIURETIC PEPTIDE: B NATRIURETIC PEPTIDE 5: 480.4 pg/mL — AB (ref 0.0–100.0)

## 2016-09-26 LAB — TROPONIN I: TROPONIN I: 29.27 ng/mL — AB (ref <0.03)

## 2016-09-26 LAB — I-STAT CG4 LACTIC ACID, ED: LACTIC ACID, VENOUS: 2.63 mmol/L — AB (ref 0.5–1.9)

## 2016-09-26 LAB — MRSA PCR SCREENING: MRSA BY PCR: NEGATIVE

## 2016-09-26 LAB — LACTIC ACID, PLASMA: LACTIC ACID, VENOUS: 2.1 mmol/L — AB (ref 0.5–1.9)

## 2016-09-26 MED ORDER — FAMOTIDINE IN NACL 20-0.9 MG/50ML-% IV SOLN
20.0000 mg | Freq: Two times a day (BID) | INTRAVENOUS | Status: DC
  Administered 2016-09-26 – 2016-09-27 (×2): 20 mg via INTRAVENOUS
  Filled 2016-09-26 (×2): qty 50

## 2016-09-26 MED ORDER — HEPARIN BOLUS VIA INFUSION
4000.0000 [IU] | Freq: Once | INTRAVENOUS | Status: AC
  Administered 2016-09-26: 4000 [IU] via INTRAVENOUS
  Filled 2016-09-26: qty 4000

## 2016-09-26 MED ORDER — NOREPINEPHRINE BITARTRATE 1 MG/ML IV SOLN
0.0000 ug/min | INTRAVENOUS | Status: DC
  Administered 2016-09-27: 6 ug/min via INTRAVENOUS
  Filled 2016-09-26 (×2): qty 4

## 2016-09-26 MED ORDER — DEXTROSE 5 % IV SOLN
1.0000 g | INTRAVENOUS | Status: DC
  Filled 2016-09-26: qty 10

## 2016-09-26 MED ORDER — SODIUM CHLORIDE 0.9 % IV BOLUS (SEPSIS)
1000.0000 mL | Freq: Once | INTRAVENOUS | Status: AC
  Administered 2016-09-26: 1000 mL via INTRAVENOUS

## 2016-09-26 MED ORDER — HEPARIN (PORCINE) IN NACL 100-0.45 UNIT/ML-% IJ SOLN
1350.0000 [IU]/h | INTRAMUSCULAR | Status: DC
  Administered 2016-09-26: 1000 [IU]/h via INTRAVENOUS
  Administered 2016-09-27: 1350 [IU]/h via INTRAVENOUS
  Filled 2016-09-26 (×2): qty 250

## 2016-09-26 MED ORDER — MORPHINE SULFATE (PF) 2 MG/ML IV SOLN
2.0000 mg | Freq: Once | INTRAVENOUS | Status: AC
  Administered 2016-09-26: 2 mg via INTRAVENOUS
  Filled 2016-09-26: qty 1

## 2016-09-26 MED ORDER — SODIUM CHLORIDE 0.9 % IV SOLN: 250.0000 mL | INTRAVENOUS | Status: DC | PRN

## 2016-09-26 MED ORDER — ASPIRIN 81 MG PO CHEW
324.0000 mg | CHEWABLE_TABLET | Freq: Once | ORAL | Status: AC
  Administered 2016-09-26: 324 mg via ORAL
  Filled 2016-09-26: qty 4

## 2016-09-26 MED ORDER — SODIUM CHLORIDE 0.9 % IV BOLUS (SEPSIS)
2700.0000 mL/kg | Freq: Once | INTRAVENOUS | Status: DC
  Administered 2016-09-26: 238950 mL via INTRAVENOUS

## 2016-09-26 MED ORDER — NOREPINEPHRINE BITARTRATE 1 MG/ML IV SOLN
0.0000 ug/min | Freq: Once | INTRAVENOUS | Status: AC
  Administered 2016-09-26: 2 ug/min via INTRAVENOUS
  Filled 2016-09-26: qty 4

## 2016-09-26 MED ORDER — ONDANSETRON HCL 4 MG/2ML IJ SOLN
4.0000 mg | Freq: Once | INTRAMUSCULAR | Status: AC
  Administered 2016-09-26: 4 mg via INTRAVENOUS
  Filled 2016-09-26: qty 2

## 2016-09-26 MED ORDER — CLOPIDOGREL BISULFATE 75 MG PO TABS
75.0000 mg | ORAL_TABLET | Freq: Every day | ORAL | Status: DC
  Administered 2016-09-27 – 2016-09-28 (×2): 75 mg via ORAL
  Filled 2016-09-26 (×2): qty 1

## 2016-09-26 MED ORDER — MORPHINE SULFATE (PF) 2 MG/ML IV SOLN
1.0000 mg | INTRAVENOUS | Status: DC | PRN
  Administered 2016-09-26 (×2): 1 mg via INTRAVENOUS
  Filled 2016-09-26 (×2): qty 1

## 2016-09-26 MED ORDER — SODIUM CHLORIDE 0.9 % IV BOLUS (SEPSIS)
27.0000 mL/kg | Freq: Once | INTRAVENOUS | Status: DC
Start: 2016-09-26 — End: 2016-09-26

## 2016-09-26 MED ORDER — NITROGLYCERIN 2 % TD OINT
0.5000 [in_us] | TOPICAL_OINTMENT | Freq: Four times a day (QID) | TRANSDERMAL | Status: DC
  Administered 2016-09-26 – 2016-09-27 (×2): 0.5 [in_us] via TOPICAL
  Filled 2016-09-26: qty 30

## 2016-09-26 MED ORDER — CLOPIDOGREL BISULFATE 300 MG PO TABS
300.0000 mg | ORAL_TABLET | Freq: Once | ORAL | Status: AC
  Administered 2016-09-26: 300 mg via ORAL
  Filled 2016-09-26: qty 1

## 2016-09-26 MED ORDER — DEXTROSE 5 % IV SOLN
1.0000 g | Freq: Once | INTRAVENOUS | Status: AC
  Administered 2016-09-26: 1 g via INTRAVENOUS
  Filled 2016-09-26: qty 10

## 2016-09-26 NOTE — Consult Note (Signed)
CARDIOLOGY CONSULT NOTE   Patient ID: Manuel [name] MRN: [id number] DOB/AGE: [birth year] 82 y.o.  Admit date: 10/20/2016  Requesting Physician: Primary Physician:   Shirline Frees, MD Primary Cardiologist:   N[place] Reason for Consultation:   STEMI, shock  HPI: Manuel [name] is a 82 y.o. male with a history significant for HTN, AF, and documentation of CAD in his chart although without any myocardial perfusion imaging, coronary angiography, or chronic ECG changes to support this diagnosis who initially presented to an UC facility with complaints of increased fatigue, epigastric discomfort, and left shoulder discomfort that had started approximately 48H prior to presentation.  The pt was in his normal state of health until that time, but then noted increasing feeling and a sensation of abdominal fullness/bloating along with reflux type symptoms.  The symptoms were initially responsive to medical management for indigestion, but last evening the symptoms returned and his shoulder pain returned and increased in intensity.  After initial evaluation at the [facility] facility he was sent to the [facility] ED where he was found to be hypotensive and with ECG concerning for inferior ST elevations.  The case was discussed with the on-call interventionalist, Dr. Gwenlyn Found, but given concern for possible concurrent sepsis in addition to the pt's age, AKI, contrast dye allergy, and the extended duration of his symptoms urgent cardiac catheterization was deferred.  [name] was subsequently transferred to [facility] and is now admitted to the ICU for further care.  Initial w/u is significant for a leukocytosis and lactic acidosis with WBC of 15.9 and lactate of 2.6 respectively.  Notably his initial troponin is > 35.  [name] remains hypotensive and is being managed with peripheral norepineprhine gtt as well as empiric antibiotics. He is currently resting comfortably in bed and has no acute complaints  while specifically denying CP or SOB.   Notably, [name] is unaware of any previous diagnosis of CAD and this diagnosis is not addressed in any of his previous cardiology notes with Dr. [name].  In reviewing his EMR, this diagnosis first appears in his automated PMH in 2015 but as per above there is no supporting non-invasive or invasive testing in his record.     Past Medical History:  Diagnosis Date  . Atrial fibrillation ([place])   . CAD (coronary artery disease)   . Carotid artery occlusion   . Colon polyps   . Gastric hemorrhage   . Hearing loss   . Hypertension   . Mallory-Weiss tear 2011   treated with 6 units PRBCs. No longer on aspirin  . Peripheral neuropathy ([place])   . Rectal bleed   . Stroke [facility]) July 2015   Right Leg Weakness  . Zenker diverticulum      Past Surgical History:  Procedure Laterality Date  . ABDOMINAL SURGERY    . APPENDECTOMY    . BACK SURGERY    . CHOLECYSTECTOMY  1999   Gall Bladder  . HERNIA REPAIR    . LOOP RECORDER IMPLANT  05/13/14   MDT LINQ implanted by Dr Caryl Comes for cryptogenic stroke  . LOOP RECORDER IMPLANT N/A 05/13/2014   Procedure: LOOP RECORDER IMPLANT;  Surgeon: Deboraha Sprang, MD;  Location[facility] CATH LAB;  Service: Cardiovascular;  Laterality: N/A;    Allergies  Allergen Reactions  . Adhesive [Tape]     Plastic tape= tears skin  . Ivp Dye [Iodinated Diagnostic Agents] Other (See Comments)    Doesn't remember reaction but it  was severe  . Penicillins Hives and Itching  . Rofecoxib Other (See Comments)     elevated BP (Vioxx)  . Sulfamethoxazole Nausea And Vomiting  . Sulfonamide Derivatives Hives and Itching  . Codeine Other (See Comments) and Hives    Pass out    I have reviewed the patient's current medications . [START ON 10/23/2016] cefTRIAXone (ROCEPHIN)  IV  1 g Intravenous Q24H  . famotidine (PEPCID) IV  20 mg Intravenous Q12H   . heparin 1,000 Units/hr (09/24/2016 1710)  . norepinephrine (LEVOPHED) Adult  infusion     sodium chloride, morphine injection  Prior to Admission medications   Medication Sig Start Date End Date Taking? Authorizing Provider  acetaminophen (TYLENOL) 650 MG CR tablet Take 650 mg by mouth every 8 (eight) hours as needed for pain (give with docusate for stool softner).   Yes Historical Provider, MD  Cholecalciferol (VITAMIN D) 2000 UNITS tablet Take 2,000 Units by mouth daily.   Yes Historical Provider, MD  Coenzyme Q10 (COQ10) 200 MG CAPS Take 200 mg by mouth daily.   Yes Historical Provider, MD  diphenhydrAMINE (SOMINEX) 25 MG tablet Take 50 mg by mouth at bedtime. Reported on 04/20/2016   Yes Historical Provider, MD  furosemide (LASIX) 20 MG tablet Take 1 tablet (20 mg total) by mouth daily. Take one tablet (20 mg) by mouth once every other day Patient taking differently: Take 20 mg by mouth every other day. Take one tablet (20 mg) by mouth once every other day 10/31/15  Yes Thayer Headings, MD  lisinopril (PRINIVIL,ZESTRIL) 20 MG tablet Take 1 tablet (20 mg total) by mouth daily. 04/20/16  Yes Thayer Headings, MD  Multiple Vitamin (MULTIVITAMIN WITH MINERALS) TABS tablet Take 1 tablet by mouth daily. NutraLite   Yes Historical Provider, MD  omeprazole (PRILOSEC) 40 MG capsule Take 40 mg by mouth every morning. Reported on 12/24/2015 07/13/14  Yes Historical Provider, MD  tamsulosin (FLOMAX) 0.4 MG CAPS capsule Take 0.4 mg by mouth daily. 09/02/16  Yes Historical Provider, MD  vitamin C (ASCORBIC ACID) 500 MG tablet Take 250 mg by mouth 2 (two) times daily.    Yes Historical Provider, MD  XARELTO 20 MG TABS tablet take 1 tablet by mouth once daily with SUPPER 08/02/16  Yes Deboraha Sprang, MD  HYDROcodone-acetaminophen (NORCO/VICODIN) 5-325 MG per tablet Take 1 tablet by mouth every 4 (four) hours as needed for moderate pain. Patient not taking: Reported on 10/05/2016 03/19/15   Jola Schmidt, MD  ondansetron [facility] ODT) 8 MG disintegrating tablet Take 1 tablet (8 mg total) by mouth  every 8 (eight) hours as needed for nausea or vomiting. Patient not taking: Reported on 10/14/2016 03/19/15   Jola Schmidt, MD     Social History   Social History  . Marital status: Married    Spouse name: N/A  . Number of children: N/A  . Years of education: N/A   Occupational History  . Not on file.   Social History Main Topics  . Smoking status: Never Smoker  . Smokeless tobacco: Never Used  . Alcohol use No  . Drug use: No  . Sexual activity: Not Currently   Other Topics Concern  . Not on file   Social History Narrative   He lives with wife and 53-year old grand daughter.     He is a retired Engineer, structural.   Highest level of education:  Some college    Family Status  Relation Status  . Mother  Deceased at age 7  . Father Deceased at age 52  . Sister Deceased  . Son Alive  . Sister Deceased at age 23  . Brother Deceased  . Brother Deceased  . Brother Deceased  . Other   . Other    Family History  Problem Relation Age of Onset  . Cancer Mother 64    Liver  . Heart attack Father     Deceased, 62  . Heart disease Father     After age 14  . Heart disease Sister     Heart Disease before age 21  . Cancer Sister   . Diabetes Son   . Parkinson's disease Sister   . Heart disease Brother     After age 56  . Cancer Brother     Prostate  . Heart disease Brother     After age 52  . Stroke Other     Deceased, siblings  . Heart disease Other     Deceased, siblings      ROS:  Full 14 point review of systems complete and found to be negative unless listed above.  Physical Exam: Blood pressure (!) 93/51, pulse 70, temperature 98 F (36.7 C), temperature source Oral, resp. rate (!) 26, height 6\' 2"  (1.88 m), weight 89 kg (196 lb 3.4 oz), SpO2 98 %.  General: elderly male in NAD, AAOx3 Head: Eyes PERRLA, No xanthomas.   Normocephalic and atraumatic, oropharynx without edema or exudate.  Lungs: CTAB, no w/r/c Heart: irregular rhythm, tachycardic with HR in the  90s, no appreciable m/r/g, no JVD, 1+ PD/DP pulses bilaterally with bilateral feet mildly cool to touch, 1+ pitting edema to lower tibia bilaterally Abdomen: Bowel sounds present, NTND Msk:  No spine or cva tenderness. No weakness, no joint deformities or effusions. Extremities: No clubbing or cyanosis.  edema.  Neuro: Alert and oriented X 3. No focal deficits noted. Psych:  Good affect, responds appropriately Skin: No rashes or lesions noted.  Labs:   Lab Results  Component Value Date   WBC 15.9 (H) 10/09/2016   HGB 12.9 (L) 10/14/2016   HCT 38.0 (L) 10/13/2016   MCV 87.8 10/06/2016   PLT 177 10/15/2016    Recent Labs  10/07/2016 1535  INR 1.29    Recent Labs Lab 09/27/2016 1537 10/09/2016 1549  NA 136 139  K 4.4 4.4  CL 105 107  CO2 21*  --   BUN 45* 40*  CREATININE 2.60* 2.20*  CALCIUM 8.8*  --   GLUCOSE 154* 149*   No results found for: MG No results for input(s): CKTOTAL, CKMB, TROPONINI in the last 72 hours.  Recent Labs  10/01/2016 1548  TROPIPOC >35.00*   No results found for: PROBNP Lab Results  Component Value Date   CHOL 153 10/01/2015   HDL 26 (L) 10/01/2015   LDLCALC 89 10/01/2015   TRIG 188 (H) 10/01/2015   Lab Results  Component Value Date   DDIMER 0.67 (H) 08/09/2013   No results found for: LIPASE, AMYLASE TSH  Date/Time Value Ref Range Status  01/16/2014 12:45 PM 2.16 0.35 - 5.50 uIU/mL Final   Vitamin B-12  Date/Time Value Ref Range Status  01/23/2014 11:47 AM 701 211 - 911 pg/mL Final   Iron  Date/Time Value Ref Range Status  11/20/2008 10:42 AM 58 42 - 165 mcg/dL Final    Echo: Pending  ECG: per my interpretation, tracing from 20:18 this evening shows atrial fibrillation with controlled ventricular response, inferior 2-82mm ST  elevation in II/aVF, normal QT interval  Radiology:  No results found.  ASSESSMENT AND PLAN:    AVRAHOM MANWELL is a 82 y.o. male with a history significant for HTN, AF, and documented CAD who presented  with several days of fatigue, GI symptoms, and subsequent left shoulder and chest pain with ECG concerning for inferior STEMI.  The initial presentation was discussed with the on-call interventionalist and cardiac catheterization was deferred given the duration of symptoms and complicated clinical picture.  The pt is now CP free and hemodynamically stable on low-dose norepinephrine gtt.  Agree with plan for continued conservative medical management with ASA and systemic anticoagulation while deferring invasive approach.  # Inferior STEMI; cardiac cath deferred, currently CP free, continue medical management - ASA 81mg  PO QDAY - heparin gtt - suggest adding P2Y12 inhibition with Clopidogrel 300mg  PO x 1 and then 75mg  PO QDAY - consider adding Atorvastatin 40mg  PO QDAY in the acute setting for plaque stabilization although long-term statin therapy at age 29 may not be clinically useful - defer beta-blockade given hypotension  - TTE in am to assess wall-motion - trend troponin until peak - continuous telemetry - repeat ECG in am to assess evolution of ischemic changes  # Shock; suspect cardiogenic etiology although with possible vasodilatory component - vasopressor support as per CCM  # Atrial Fibrillation; rate-controlled - hold home Rivaroxaban given heparin gtt as per above  # HTN; - hold ACE-I in the setting of shock and AKI  # AKI; baseline creatine approximately 1.2 and now 2.6 - likely 2/2 hypotension/shock, management as per CCM  Active Problems:   Cardiogenic shock ([place])   Signed: Clayborne Dana, MD 10/10/2016 8:14 PM

## 2016-09-26 NOTE — ED Notes (Signed)
PT HAS LOW BP 81/50, 78/57, 81/50 WITH FLUID BOLUS.  NO CHANGE IN JAW PAIN. EDP REQUESTING PACER PADS. CHARGE SUSAN RN MADE AWARE OF NEED TO TRANSFER TO RES. REPORT GIVEN TO PATTI D RN EDP FLOYD MADE AWARE OF CURRENT STATUS.

## 2016-09-26 NOTE — ED Notes (Addendum)
2ND BLOOD CULTURE 5ML OBTAINED RT AC prior to starting antibiotics.

## 2016-09-26 NOTE — ED Triage Notes (Signed)
Pt arrived to Res A, Alert and oriented, HOH, pt continues to have discomfort in neck,

## 2016-09-26 NOTE — ED Notes (Signed)
This RN attempted to give report. Accepting unit stated they will call ED staff to get report in a few min

## 2016-09-26 NOTE — Progress Notes (Signed)
ANTICOAGULATION CONSULT NOTE - Initial Consult  Pharmacy Consult for heparin Indication: ACS/STEMI  Allergies  Allergen Reactions  . Adhesive [Tape]     Plastic tape= tears skin  . Ivp Dye [Iodinated Diagnostic Agents] Other (See Comments)    Doesn't remember reaction but it was severe  . Penicillins Hives and Itching  . Rofecoxib Other (See Comments)     elevated BP (Vioxx)  . Sulfamethoxazole Nausea And Vomiting  . Sulfonamide Derivatives Hives and Itching  . Codeine Other (See Comments) and Hives    Pass out    Patient Measurements: Height: 6\' 1"  (185.4 cm) Weight: 192 lb (87.1 kg) IBW/kg (Calculated) : 79.9   Vital Signs: Temp: 98.1 F (36.7 C) (12/03 1518) Temp Source: Oral (12/03 1518) BP: 100/51 (12/03 1615) Pulse Rate: 80 (12/03 1615)  Labs:  Recent Labs  10/17/2016 1537 10/01/2016 1549  HGB 12.2* 12.9*  HCT 37.5* 38.0*  PLT 177  --   CREATININE 2.60* 2.20*    Estimated Creatinine Clearance (by C-G formula based on SCr of 2.2 mg/dL (H)) Male: 19.4 mL/min Male: 24.2 mL/min   Medical History: Past Medical History:  Diagnosis Date  . Atrial fibrillation ([place])   . CAD (coronary artery disease)   . Carotid artery occlusion   . Colon polyps   . Gastric hemorrhage   . Hearing loss   . Hypertension   . Mallory-Weiss tear 2011   treated with 6 units PRBCs. No longer on aspirin  . Peripheral neuropathy ([place])   . Rectal bleed   . Stroke [facility]) July 2015   Right Leg Weakness  . Zenker diverticulum     Assessment: 82 yo male complaints of fatigue, nausea, neck pain, and elevated WBC (16.8), low lymphocytes  (12.9).  Hx of Afib but hasn't taken his xarelto since sometime in October due to cost issues. Trop >35, pharmacy consulted to start heparin for ACS/STEMI  Goal of Therapy:  Heparin level 0.3-0.7 units/ml Monitor platelets by anticoagulation protocol   Plan:  Baseline PTT and PT/INR ordered Bolus 4000 units x 1 then start heparin drip at 1000  units/hr Heparin level in 8 hours Daily CBC   Dolly Rias RPh 10/18/2016, 4:46 PM Pager [phone]

## 2016-09-26 NOTE — ED Notes (Signed)
Fluids discontinued per Dr. Tyrone Nine

## 2016-09-26 NOTE — Progress Notes (Signed)
2230: pt c/o 6/10 CP, morphine given, EKG done, Cards paged. Will come see pt

## 2016-09-26 NOTE — H&P (Signed)
PULMONARY / CRITICAL CARE MEDICINE   Name: Manuel [name] MRN: [id number] DOB: 01-Nov-1921    ADMISSION DATE:  10/11/2016 CONSULTATION DATE:   REFERRING MD:  EDP  CHIEF COMPLAINT:  Chest/neck pain  HISTORY OF PRESENT ILLNESS:   Manuel [name] is a highly functional and independent 39M who lives at home with his wife with PMH significant for CAD, peripheral neuropathy, prior GI bleeding, HTN, prior stroke (2015) and chronic atrial fibrillation who presented to urgent care earlier today with complaints of epigastric and substernal chest discomfort that radiated to both sides of his neck associated with diaphoresis and chills. Symptom onset was approximately 36-48h ago. He initially thought he was just having bad reflux with retrosternal burning and some mild stomach upset. The pain abated and he went about his business, but had recurrent pain last night and took something for indigestion. He also took some tylenol. He gets constipated with tylenol, so he also took some stool softeners. His pain did not improve and he got very cold. He laid down and awoke with pain radiating to both sides of his neck. He then presented for evaluation. From urgent care he was sent to the [facility] ED. Initial workup revealed troponin >35, lactate 2.63, WBC 15.9, Cr 2.6, BUN 45, INR 1.29 and glucose 150. EKG showed ST elevation in the inferior leads. Cardiology was consulted, but did not think he was a good candidate for catheterization given duration of symptoms, renal function and documented allergy to IVP contrast dye. He was given ASA, heparinized and sent here for further care. He was noted to be hypotensive and was started on peripheral levophed. Blood cultures were also drawn and he was started on Rocephin.  He denies any associated infectious symptoms - no fever / cough /sputum / nausea / vomiting / diarrhea / urinary symptoms. No orthopnea or PND. No new dyspnea on exertion. The swelling in his lower extremities  has been a little worse lately, but he attributes this to his neuropathy and poor circulation.  PAST MEDICAL HISTORY :  He  has a past medical history of Atrial fibrillation ([place]); CAD (coronary artery disease); Carotid artery occlusion; Colon polyps; Gastric hemorrhage; Hearing loss; Hypertension; Mallory-Weiss tear (2011); Peripheral neuropathy ([place]); Rectal bleed; Stroke [facility]) (July 2015); and Zenker diverticulum.  PAST SURGICAL HISTORY: He  has a past surgical history that includes Back surgery; Appendectomy; Abdominal surgery; Hernia repair; Loop recorder implant (05/13/14); loop recorder implant (N/A, 05/13/2014); and Cholecystectomy (1999).  Allergies  Allergen Reactions  . Adhesive [Tape]     Plastic tape= tears skin  . Ivp Dye [Iodinated Diagnostic Agents] Other (See Comments)    Doesn't remember reaction but it was severe  . Penicillins Hives and Itching  . Rofecoxib Other (See Comments)     elevated BP (Vioxx)  . Sulfamethoxazole Nausea And Vomiting  . Sulfonamide Derivatives Hives and Itching  . Codeine Other (See Comments) and Hives    Pass out    No current facility-administered medications on file prior to encounter.    Current Outpatient Prescriptions on File Prior to Encounter  Medication Sig  . acetaminophen (TYLENOL) 650 MG CR tablet Take 650 mg by mouth every 8 (eight) hours as needed for pain (give with docusate for stool softner).  . Cholecalciferol (VITAMIN D) 2000 UNITS tablet Take 2,000 Units by mouth daily.  . Coenzyme Q10 (COQ10) 200 MG CAPS Take 200 mg by mouth daily.  . diphenhydrAMINE (SOMINEX) 25 MG tablet Take 50 mg by mouth at  bedtime. Reported on 04/20/2016  . furosemide (LASIX) 20 MG tablet Take 1 tablet (20 mg total) by mouth daily. Take one tablet (20 mg) by mouth once every other day (Patient taking differently: Take 20 mg by mouth every other day. Take one tablet (20 mg) by mouth once every other day)  . lisinopril (PRINIVIL,ZESTRIL) 20 MG tablet  Take 1 tablet (20 mg total) by mouth daily.  . Multiple Vitamin (MULTIVITAMIN WITH MINERALS) TABS tablet Take 1 tablet by mouth daily. NutraLite  . omeprazole (PRILOSEC) 40 MG capsule Take 40 mg by mouth every morning. Reported on 12/24/2015  . vitamin C (ASCORBIC ACID) 500 MG tablet Take 250 mg by mouth 2 (two) times daily.   Alveda Reasons 20 MG TABS tablet take 1 tablet by mouth once daily with SUPPER  . HYDROcodone-acetaminophen (NORCO/VICODIN) 5-325 MG per tablet Take 1 tablet by mouth every 4 (four) hours as needed for moderate pain. (Patient not taking: Reported on 10/22/2016)  . ondansetron (ZOFRAN ODT) 8 MG disintegrating tablet Take 1 tablet (8 mg total) by mouth every 8 (eight) hours as needed for nausea or vomiting. (Patient not taking: Reported on 10/11/2016)    FAMILY HISTORY:  His indicated that his mother is deceased. He indicated that his father is deceased. He indicated that both of his sisters are deceased. He indicated that all of his three brothers are deceased. He indicated that his son is alive.    SOCIAL HISTORY: He  reports that he has never smoked. He has never used smokeless tobacco. He reports that he does not drink alcohol or use drugs.  REVIEW OF SYSTEMS:   General: no weight change, no fever,  Cardiovascular: see HPI Respiratory: no cough, sputum production, shortness of breath Gastrointestinal: no nausea, vomiting, diarrhea Genitourinary: no pain with urination, no foul odor, no frequency, no urgency Musculoskeletal: no joint pain or swelling, no muscle aches Skin: no rashes, sores, or ulcers Endocrine: no blood sugar problems, no thyroid problems Neurologic: + LE numbness / tingling, otherwise no numbness, tingling, dizziness, or visual changes Hematologic: no bleeding, bruising, or clotting problems Psychiatric: no depression or anxiety, no suicidal ideation or intent   SUBJECTIVE:    VITAL SIGNS: BP (!) 93/51   Pulse 70   Temp 98 F (36.7 C) (Oral)    Resp (!) 26   Ht 6\' 2"  (1.88 m)   Wt 89 kg (196 lb 3.4 oz)   SpO2 98%   BMI 25.19 kg/m   HEMODYNAMICS:    VENTILATOR SETTINGS:    INTAKE / OUTPUT: No intake/output data recorded.  PHYSICAL EXAMINATION: Physical Exam: Temp:  [98 F (36.7 C)-98.1 F (36.7 C)] 98 F (36.7 C) (12/03 1832) Pulse Rate:  [65-80] 70 (12/03 1900) Resp:  [18-27] 26 (12/03 1900) BP: (71-100)/(43-59) 93/51 (12/03 1900) SpO2:  [95 %-100 %] 98 % (12/03 1900) Weight:  [87.1 kg (192 lb)-89 kg (196 lb 3.4 oz)] 89 kg (196 lb 3.4 oz) (12/03 1832)  General Well nourished, well developed, no apparent distress  HEENT No gross abnormalities. Oropharynx clear. Mallampati III. Good dentition.   Pulmonary Clear to auscultation bilaterally with no wheezes, rales or ronchi on anterior exam. Good effort, symmetrical expansion.   Cardiovascular Normal rate, irregularly irregular rhythm. S1, s2. No m/r/g. Distal pulses palpable, but diminished. Extremities cool. 1+ bipedal edema.   Abdomen Soft, non-tender, non-distended, positive bowel sounds, no palpable organomegaly or masses. Normoresonant to percussion.  Musculoskeletal No gross abnormalities.  Lymphatics No cervical, supraclavicular or axillary adenopathy.  Neurologic Grossly intact. No focal deficits.   Skin/Integuement No rash, no cyanosis, no clubbing. Lower extremities with chronic venous stasis changes bilaterally and cool to the touch with diminished pulses.      LABS:  BMET  Recent Labs Lab 10/11/2016 1537 09/25/2016 1549  NA 136 139  K 4.4 4.4  CL 105 107  CO2 21*  --   BUN 45* 40*  CREATININE 2.60* 2.20*  GLUCOSE 154* 149*    Electrolytes  Recent Labs Lab 09/25/2016 1537  CALCIUM 8.8*    CBC  Recent Labs Lab 09/27/2016 1537 10/22/2016 1549  WBC 15.9*  --   HGB 12.2* 12.9*  HCT 37.5* 38.0*  PLT 177  --     Coag's  Recent Labs Lab 10/23/2016 1535  APTT 32  INR 1.29    Sepsis Markers  Recent Labs Lab 10/14/2016 1549   LATICACIDVEN 2.63*    ABG No results for input(s): PHART, PCO2ART, PO2ART in the last 168 hours.  Liver Enzymes No results for input(s): AST, ALT, ALKPHOS, BILITOT, ALBUMIN in the last 168 hours.  Cardiac Enzymes No results for input(s): TROPONINI, PROBNP in the last 168 hours.  Glucose No results for input(s): GLUCAP in the last 168 hours.  Imaging No results found.   STUDIES:  n/a  CULTURES: 12/3 Blood x 2 >>>  ANTIBIOTICS: Rocephin  SIGNIFICANT EVENTS: txf to [facility]  LINES/TUBES: PIV  DISCUSSION: Mr. Garvey is a 38M admitted with STEMI and cardiogenic shock requiring pressors. He is a poor candidate for PCI secondary to multiple factors (renal function, age, contrast allergy, duration of symptoms). With his elevated WBC, there may be some concern for sepsis as a confounding etiology, but this seems unlikely based on his clinical history. Nevertheless, will follow blood cultures, obtain UA/cx and continue rocephin for now. He needs a formal TTE and we will await formal cardiology recommendations. Continue heparin. Titrate levo to MAP 65.   ASSESSMENT / PLAN:  CARDIOVASCULAR A:  STEMI - inferior  Shock, presumed cardiogenic Chronic atrial fibrillation P:  Continue pressor support for MAP > 65; may require CVC placement if dose escalates TTE Continue heparin  Trend troponin ECG Cardiology to see  RENAL A:   Acute kidney injury - Cr 2.6 from baseline ~ 1.2 - likely secondary to shock state Lactic acidosis P:   Continue pressors for MAP > 65 Monitor UOP Check UA Trend lactate  INFECTIOUS A:   Concern for sepsis, although no clear source P:   Continue empiric rocephin Check UA Follow blood cultures Trend lactate  PULMONARY A: No acute issues P:   Supplemental O2 if needed   GASTROINTESTINAL A:   Hx of GERD and constipation P:   Famotidine  Bowel regimen  HEMATOLOGIC A:   Elevated WBC - likely reactive P:  Trend  CBC  ENDOCRINE A:   Hyperglycemia - likely stress response  P:   CBGs and SSI if needed  NEUROLOGIC A:   No acute issues Peripheral neuropathy Hx of stroke P:   RASS goal: 0    FAMILY  - Updates: Discussion with patient at bedside. He has not previously thought about or discussed code status and would like to remain full code until he can talk about it with his family. Patient will remain FULL CODE for now.   - Inter-disciplinary family meet or Palliative Care meeting due by:  day 7  The patient is critically ill with multiple organ system failure and requires high complexity decision making for  assessment and support, frequent evaluation and titration of therapies, advanced monitoring, review of radiographic studies and interpretation of complex data.   Critical Care Time devoted to patient care services, exclusive of separately billable procedures, described in this note is 62 minutes.   Yisroel Ramming, MD Pulmonary and [place] Pager: [phone]  09/25/2016, 7:42 PM

## 2016-09-26 NOTE — ED Notes (Addendum)
Wife states husband normally cannot take ASA due to GI problem and was told to never take ASA again. EDP Tyrone Nine made aware. Pt and wife made aware EDP request that pt need ASA and this was an emergency. Pt and wife verbalized understanding. Medication given as ordered

## 2016-09-26 NOTE — ED Notes (Signed)
I stat trop >35, MD Tyrone Nine notified

## 2016-09-26 NOTE — ED Notes (Signed)
Pt's wife, Jobe Gibbon can be contacted at [phone] or cell [phone]

## 2016-09-26 NOTE — ED Notes (Signed)
Lactic Acid= 2.63, EDP Floyd notified

## 2016-09-26 NOTE — ED Triage Notes (Addendum)
Pt from his PCP with complaints of fatigue, nausea, neck pain, and elevated WBC (16.8), low lymphocytes  (12.9). Pt states that this began on Thurs when his wife turned down the heat. Pt states he got incredibly cold and has had neck and generalized abdominal pain.  Pt is afebrile. Pt is a&O x 4 Pt's lbm was this morning and was normal. Pt reports intermittent nausea with no emesis.

## 2016-09-26 NOTE — ED Provider Notes (Signed)
[facility] DEPT Provider Note   CSN: [id number] Arrival date & time: 10/08/2016  1516     History   Chief Complaint Chief Complaint  Patient presents with  . Code STEMI  . Nausea    HPI Manuel [name] is a 82 y.o. male.  82 yo M with weakness, going on for past three or four days.  Patient states that after he had some in the eat he started having indigestion and since then has been little fatigued and having trouble getting around the house. Having some chills but denies fevers. Saw his family physician today who obtained labs and a chest x-ray there is some concern for the leukocytosis and he was told to come to the ED for further evaluation. Patient denies lower extremity edema. Denies abdominal tenderness. Denies vomiting.   The history is provided by the patient.  Illness  This is a new problem. The current episode started more than 2 days ago. The problem occurs constantly. The problem has been gradually worsening. Associated symptoms include chest pain and shortness of breath. Pertinent negatives include no abdominal pain and no headaches. The symptoms are aggravated by exertion. Nothing relieves the symptoms. He has tried nothing for the symptoms. The treatment provided no relief.    Past Medical History:  Diagnosis Date  . Atrial fibrillation ([place])   . CAD (coronary artery disease)   . Carotid artery occlusion   . Colon polyps   . Gastric hemorrhage   . Hearing loss   . Hypertension   . Mallory-Weiss tear 2011   treated with 6 units PRBCs. No longer on aspirin  . Peripheral neuropathy ([place])   . Rectal bleed   . Stroke [facility]) July 2015   Right Leg Weakness  . Zenker diverticulum     Patient Active Problem List   Diagnosis Date Noted  . Cardiogenic shock ([place]) 10/22/2016  . Atrial fibrillation ([place]) 08/13/2014  . Unspecified constipation 05/15/2014  . CVA (cerebral infarction) 05/09/2014  . Acute CVA (cerebrovascular accident) ([place]) 05/09/2014  .  Bilateral leg weakness 05/08/2014  . Lower extremity weakness 05/07/2014  . Leg weakness 05/07/2014  . Peripheral vascular disease, unspecified 12/28/2013  . Occlusion and stenosis of carotid artery without mention of cerebral infarction 12/13/2012  . ZENKER'S DIVERTICULUM 12/09/2010  . GASTROINTESTINAL HEMORRHAGE 08/26/2009  . CAD 11/20/2008  . PERIPHERAL NEUROPATHY 11/14/2008  . HEARING LOSS 11/14/2008  . Benign essential HTN 11/14/2008  . HEMOCCULT POSITIVE STOOL 11/14/2008  . COLONIC POLYPS, HX OF 11/14/2008  . GASTROENTERITIS, HX OF 11/14/2008    Past Surgical History:  Procedure Laterality Date  . ABDOMINAL SURGERY    . APPENDECTOMY    . BACK SURGERY    . CHOLECYSTECTOMY  1999   Gall Bladder  . HERNIA REPAIR    . LOOP RECORDER IMPLANT  05/13/14   MDT LINQ implanted by Dr Caryl Comes for cryptogenic stroke  . LOOP RECORDER IMPLANT N/A 05/13/2014   Procedure: LOOP RECORDER IMPLANT;  Surgeon: Deboraha Sprang, MD;  Location: [facility] CATH LAB;  Service: Cardiovascular;  Laterality: N/A;    OB History    No data available       Home Medications    Prior to Admission medications   Medication Sig Start Date End Date Taking? Authorizing Provider  acetaminophen (TYLENOL) 650 MG CR tablet Take 650 mg by mouth every 8 (eight) hours as needed for pain (give with docusate for stool softner).   Yes Historical Provider, MD  Cholecalciferol (VITAMIN D) 2000  UNITS tablet Take 2,000 Units by mouth daily.   Yes Historical Provider, MD  Coenzyme Q10 (COQ10) 200 MG CAPS Take 200 mg by mouth daily.   Yes Historical Provider, MD  diphenhydrAMINE (SOMINEX) 25 MG tablet Take 50 mg by mouth at bedtime. Reported on 04/20/2016   Yes Historical Provider, MD  furosemide (LASIX) 20 MG tablet Take 1 tablet (20 mg total) by mouth daily. Take one tablet (20 mg) by mouth once every other day Patient taking differently: Take 20 mg by mouth every other day. Take one tablet (20 mg) by mouth once every other day 10/31/15   Yes Thayer Headings, MD  lisinopril (PRINIVIL,ZESTRIL) 20 MG tablet Take 1 tablet (20 mg total) by mouth daily. 04/20/16  Yes Thayer Headings, MD  Multiple Vitamin (MULTIVITAMIN WITH MINERALS) TABS tablet Take 1 tablet by mouth daily. NutraLite   Yes Historical Provider, MD  omeprazole (PRILOSEC) 40 MG capsule Take 40 mg by mouth every morning. Reported on 12/24/2015 07/13/14  Yes Historical Provider, MD  tamsulosin (FLOMAX) 0.4 MG CAPS capsule Take 0.4 mg by mouth daily. 09/02/16  Yes Historical Provider, MD  vitamin C (ASCORBIC ACID) 500 MG tablet Take 250 mg by mouth 2 (two) times daily.    Yes Historical Provider, MD  XARELTO 20 MG TABS tablet take 1 tablet by mouth once daily with SUPPER 08/02/16  Yes Deboraha Sprang, MD  HYDROcodone-acetaminophen (NORCO/VICODIN) 5-325 MG per tablet Take 1 tablet by mouth every 4 (four) hours as needed for moderate pain. Patient not taking: Reported on 10/09/2016 03/19/15   Jola Schmidt, MD  ondansetron [facility] ODT) 8 MG disintegrating tablet Take 1 tablet (8 mg total) by mouth every 8 (eight) hours as needed for nausea or vomiting. Patient not taking: Reported on 10/21/2016 03/19/15   Jola Schmidt, MD    Family History Family History  Problem Relation Age of Onset  . Cancer Mother 41    Liver  . Heart attack Father     Deceased, 36  . Heart disease Father     After age 89  . Heart disease Sister     Heart Disease before age 2  . Cancer Sister   . Diabetes Son   . Parkinson's disease Sister   . Heart disease Brother     After age 86  . Cancer Brother     Prostate  . Heart disease Brother     After age 72  . Stroke Other     Deceased, siblings  . Heart disease Other     Deceased, siblings    Social History Social History  Substance Use Topics  . Smoking status: Never Smoker  . Smokeless tobacco: Never Used  . Alcohol use No     Allergies   Adhesive [tape]; Ivp dye [iodinated diagnostic agents]; Penicillins; Rofecoxib; Sulfamethoxazole;  Sulfonamide derivatives; and Codeine   Review of Systems Review of Systems  Constitutional: Negative for chills and fever.  HENT: Negative for congestion and facial swelling.   Eyes: Negative for discharge and visual disturbance.  Respiratory: Positive for shortness of breath.   Cardiovascular: Positive for chest pain. Negative for palpitations.  Gastrointestinal: Positive for nausea. Negative for abdominal pain, diarrhea and vomiting.  Musculoskeletal: Negative for arthralgias and myalgias.  Skin: Negative for color change and rash.  Neurological: Positive for weakness. Negative for tremors, syncope and headaches.  Psychiatric/Behavioral: Negative for confusion and dysphoric mood.     Physical Exam Updated Vital Signs BP (!) 93/51  Pulse 70   Temp 98 F (36.7 C) (Oral)   Resp (!) 26   Ht 6\' 2"  (1.88 m)   Wt 196 lb 3.4 oz (89 kg)   SpO2 98%   BMI 25.19 kg/m   Physical Exam  Constitutional: He is oriented to person, place, and time. He appears well-developed and well-nourished.  HENT:  Head: Normocephalic and atraumatic.  Eyes: EOM are normal. Pupils are equal, round, and reactive to light.  Neck: Normal range of motion. Neck supple. No JVD present.  Cardiovascular: Normal rate and regular rhythm.  Exam reveals no gallop and no friction rub.   No murmur heard. Pulmonary/Chest: No respiratory distress. He has no wheezes.  Abdominal: He exhibits no distension and no mass. There is no tenderness. There is no rebound and no guarding.  Musculoskeletal: Normal range of motion.  Neurological: He is alert and oriented to person, place, and time.  Skin: No rash noted. No pallor.  Psychiatric: He has a normal mood and affect. His behavior is normal.  Nursing note and vitals reviewed.    ED Treatments / Results  Labs (all labs ordered are listed, but only abnormal results are displayed) Labs Reviewed  CBC WITH DIFFERENTIAL/PLATELET - Abnormal; Notable for the following:        Result Value   WBC 15.9 (*)    Hemoglobin 12.2 (*)    HCT 37.5 (*)    Neutro Abs 12.2 (*)    Monocytes Absolute 2.4 (*)    All other components within normal limits  BASIC METABOLIC PANEL - Abnormal; Notable for the following:    CO2 21 (*)    Glucose, Bld 154 (*)    BUN 45 (*)    Creatinine, Ser 2.60 (*)    Calcium 8.8 (*)    GFR calc non Af Amer 20 (*)    GFR calc Af Amer 23 (*)    All other components within normal limits  PROTIME-INR - Abnormal; Notable for the following:    Prothrombin Time 16.2 (*)    All other components within normal limits  I-STAT CHEM 8, ED - Abnormal; Notable for the following:    BUN 40 (*)    Creatinine, Ser 2.20 (*)    Glucose, Bld 149 (*)    Calcium, Ion 1.11 (*)    Hemoglobin 12.9 (*)    HCT 38.0 (*)    All other components within normal limits  I-STAT CG4 LACTIC ACID, ED - Abnormal; Notable for the following:    Lactic Acid, Venous 2.63 (*)    All other components within normal limits  I-STAT TROPOININ, ED - Abnormal; Notable for the following:    Troponin i, poc >35.00 (*)    All other components within normal limits  CULTURE, BLOOD (ROUTINE X 2)  CULTURE, BLOOD (ROUTINE X 2)  MRSA PCR SCREENING  URINE CULTURE  APTT  HEPARIN LEVEL (UNFRACTIONATED)  CBC  BASIC METABOLIC PANEL  MAGNESIUM  PHOSPHORUS  URINALYSIS, ROUTINE W REFLEX MICROSCOPIC (NOT AT [facility])  BRAIN NATRIURETIC PEPTIDE  TROPONIN I  TROPONIN I  LACTIC ACID, PLASMA  I-STAT CG4 LACTIC ACID, ED    EKG  EKG Interpretation  Date/Time:  Sunday September 26 2016 15:45:51 EST Ventricular Rate:  89 PR Interval:    QRS Duration: 91 QT Interval:  355 QTC Calculation: 432 R Axis:   66 Text Interpretation:  Atrial fibrillation Inferior infarct, acute (RCA) Probable RV involvement, suggest recording right precordial leads Confirmed by Malakai Schoenherr MD, DANIEL [phone])  on 09/25/2016 3:55:31 PM       Radiology No results found.  Procedures Procedures (including critical care  time)   EMERGENCY DEPARTMENT [place] CARDIAC EXAM "Study: Limited Ultrasound of the heart and pericardium"  INDICATIONS:Hypotension Multiple views of the heart and pericardium are obtained with a multi-frequency probe.  PERFORMED [id number]  IMAGES ARCHIVED?: Yes  FINDINGS: No pericardial effusion, Decreased contractility and IVC dilated  LIMITATIONS:  Body habitus  VIEWS USED: Subcostal 4 chamber and Inferior Vena Cava  INTERPRETATION: Cardiac activity present, Pericardial effusion present, Cardiac tamponade absent, Probable elevated CVP and Decreased contractility  COMMENT:  Likely elevated CVP, will start on pressors.   Medications Ordered in ED Medications  heparin ADULT infusion 100 units/mL (25000 units/254mL sodium chloride 0.45%) (1,000 Units/hr Intravenous New Bag/Given 10/09/2016 1710)  norepinephrine (LEVOPHED) 4 mg in dextrose 5 % 250 mL (0.016 mg/mL) infusion (7 mcg/min Intravenous Transfusing/Transfer 10/15/2016 1905)  0.9 %  sodium chloride infusion (not administered)  famotidine (PEPCID) IVPB 20 mg premix (not administered)  morphine 2 MG/ML injection 1-2 mg (not administered)  cefTRIAXone (ROCEPHIN) 1 g in dextrose 5 % 50 mL IVPB (not administered)  aspirin chewable tablet 324 mg (324 mg Oral Given 09/29/2016 1608)  sodium chloride 0.9 % bolus 1,000 mL (1,000 mLs Intravenous New Bag/Given 10/09/2016 1607)  cefTRIAXone (ROCEPHIN) 1 g in dextrose 5 % 50 mL IVPB (0 g Intravenous Stopped 10/14/2016 1754)  morphine 2 MG/ML injection 2 mg (2 mg Intravenous Given 09/27/2016 1620)  ondansetron (ZOFRAN) injection 4 mg (4 mg Intravenous Given 10/04/2016 1620)  heparin bolus via infusion 4,000 Units (4,000 Units Intravenous Bolus from Bag 10/23/2016 1709)  norepinephrine (LEVOPHED) 4 mg in dextrose 5 % 250 mL (0.016 mg/mL) infusion (5 mcg/min Intravenous Rate/Dose Change 10/03/2016 1750)     Initial Impression / Assessment and Plan / ED Course  I have reviewed the triage vital signs and the nursing  notes.  Pertinent labs & imaging results that were available during my care of the patient were reviewed by me and considered in my medical decision making (see chart for details).  Clinical Course     82 yo M With a chief complaint of weakness. This started a few days ago. Patient was seen at PCPs office was some concern for infection. Patient was hypotensive there. Code sepsis was initiated here. However patient EKG was obtained and showed a inferior ST elevation MI. I discussed the case with Dr. Gwenlyn Found, cardiology. Due to patient's age and duration of symptoms and elevated white count he did not feel that this needed to rapidly be brought to the Cath Lab. He recommended hospitalist admission at [facility] with cardiology consult. Cover the patient with Rocephin. Started gentle fluid rehydration. Started on heparin given aspirin.  Patient with worsening hypotension, bedside US with diminished EF, IVC dilated. Started on levophed.    CRITICAL CARE Performed by: Cecilio Asper   Total critical care time: 80 minutes  Critical care time was exclusive of separately billable procedures and treating other patients.  Critical care was necessary to treat or prevent imminent or life-threatening deterioration.  Critical care was time spent personally by me on the following activities: development of treatment plan with patient and/or surrogate as well as nursing, discussions with consultants, evaluation of patient's response to treatment, examination of patient, obtaining history from patient or surrogate, ordering and performing treatments and interventions, ordering and review of laboratory studies, ordering and review of radiographic studies, pulse oximetry and re-evaluation of patient's condition.  The patients results and plan were reviewed and discussed.   Any x-rays performed were independently reviewed by myself.   Differential diagnosis were considered with the presenting  HPI.  Medications  heparin ADULT infusion 100 units/mL (25000 units/252mL sodium chloride 0.45%) (1,000 Units/hr Intravenous New Bag/Given 10/21/2016 1710)  norepinephrine (LEVOPHED) 4 mg in dextrose 5 % 250 mL (0.016 mg/mL) infusion (7 mcg/min Intravenous Transfusing/Transfer 09/27/2016 1905)  0.9 %  sodium chloride infusion (not administered)  famotidine (PEPCID) IVPB 20 mg premix (not administered)  morphine 2 MG/ML injection 1-2 mg (not administered)  cefTRIAXone (ROCEPHIN) 1 g in dextrose 5 % 50 mL IVPB (not administered)  aspirin chewable tablet 324 mg (324 mg Oral Given 10/14/2016 1608)  sodium chloride 0.9 % bolus 1,000 mL (1,000 mLs Intravenous New Bag/Given 10/22/2016 1607)  cefTRIAXone (ROCEPHIN) 1 g in dextrose 5 % 50 mL IVPB (0 g Intravenous Stopped 09/30/2016 1754)  morphine 2 MG/ML injection 2 mg (2 mg Intravenous Given 10/18/2016 1620)  ondansetron (ZOFRAN) injection 4 mg (4 mg Intravenous Given 10/18/2016 1620)  heparin bolus via infusion 4,000 Units (4,000 Units Intravenous Bolus from Bag 10/08/2016 1709)  norepinephrine (LEVOPHED) 4 mg in dextrose 5 % 250 mL (0.016 mg/mL) infusion (5 mcg/min Intravenous Rate/Dose Change 10/12/2016 1750)    Vitals:   09/29/2016 1750 10/04/2016 1832 10/09/2016 1845 09/24/2016 1900  BP: (!) 91/54 (!) 71/49 (!) 81/59 (!) 93/51  Pulse: 71  71 70  Resp: 23 (!) 23 18 (!) 26  Temp:  98 F (36.7 C)    TempSrc:  Oral    SpO2: 98% 99% 97% 98%  Weight:  196 lb 3.4 oz (89 kg)    Height:  6\' 2"  (1.88 m)      Final diagnoses:  Acute ST elevation myocardial infarction (STEMI) of inferior wall involving right ventricle (HCC)  Cardiogenic shock (HCC)  AKI (acute kidney injury) ([place])     Final Clinical Impressions(s) / ED Diagnoses   Final diagnoses:  Acute ST elevation myocardial infarction (STEMI) of inferior wall involving right ventricle (HCC)  Cardiogenic shock (HCC)  AKI (acute kidney injury) [facility])    New Prescriptions Current Discharge Medication List        Deno Etienne, DO 10/20/2016 2045

## 2016-09-27 ENCOUNTER — Inpatient Hospital Stay (HOSPITAL_COMMUNITY): Payer: Medicare Other

## 2016-09-27 ENCOUNTER — Encounter (HOSPITAL_COMMUNITY): Admission: EM | Disposition: E | Payer: Self-pay | Source: Home / Self Care | Attending: Pulmonary Disease

## 2016-09-27 DIAGNOSIS — R570 Cardiogenic shock: Secondary | ICD-10-CM

## 2016-09-27 DIAGNOSIS — I472 Ventricular tachycardia, unspecified: Secondary | ICD-10-CM

## 2016-09-27 DIAGNOSIS — I2119 ST elevation (STEMI) myocardial infarction involving other coronary artery of inferior wall: Principal | ICD-10-CM

## 2016-09-27 DIAGNOSIS — I255 Ischemic cardiomyopathy: Secondary | ICD-10-CM

## 2016-09-27 DIAGNOSIS — N179 Acute kidney failure, unspecified: Secondary | ICD-10-CM

## 2016-09-27 DIAGNOSIS — I2510 Atherosclerotic heart disease of native coronary artery without angina pectoris: Secondary | ICD-10-CM

## 2016-09-27 DIAGNOSIS — R9431 Abnormal electrocardiogram [ECG] [EKG]: Secondary | ICD-10-CM

## 2016-09-27 HISTORY — PX: CARDIAC CATHETERIZATION: SHX172

## 2016-09-27 LAB — HEPARIN LEVEL (UNFRACTIONATED)
Heparin Unfractionated: <0.1 IU/mL — ABNORMAL LOW (ref 0.30–0.70)
Heparin Unfractionated: 0.26 IU/mL — ABNORMAL LOW (ref 0.30–0.70)

## 2016-09-27 LAB — ECHOCARDIOGRAM COMPLETE
AVLVOTPG: 1 mmHg
AVPHT: 398 ms
CHL CUP DOP CALC LVOT VTI: 10.9 cm
CHL CUP STROKE VOLUME: 32 mL
CHL CUP TV REG PEAK VELOCITY: 218 cm/s
E decel time: 296 msec
FS: 19 % — AB (ref 28–44)
IVS/LV PW RATIO, ED: 1
LA ID, A-P, ES: 35 mm
LA diam index: 1.62 cm/m2
LA vol A4C: 86.4 ml
LDCA: 3.14 cm2
LEFT ATRIUM END SYS DIAM: 35 mm
LV PW d: 10 mm — AB (ref 0.6–1.1)
LV TDI E'LATERAL: 8.16
LV dias vol index: 43 mL/m2
LV e' LATERAL: 8.16 cm/s
LVDIAVOL: 93 mL (ref 62–150)
LVOT SV: 34 mL
LVOT peak vel: 56.9 cm/s
LVOTD: 20 mm
LVSYSVOL: 60 mL (ref 21–61)
LVSYSVOLIN: 28 mL/m2
MV Dec: 296
MVPKEVEL: 0.9 m/s
PV Reg vel dias: 65.8 cm/s
RV sys press: 34 mmHg
Simpson's disk: 35
TDI e' medial: 6.64
TR max vel: 218 cm/s

## 2016-09-27 LAB — BASIC METABOLIC PANEL
Anion gap: 10 (ref 5–15)
BUN: 43 mg/dL — AB (ref 6–20)
CO2: 17 mmol/L — ABNORMAL LOW (ref 22–32)
CREATININE: 2.32 mg/dL — AB (ref 0.61–1.24)
Calcium: 7.9 mg/dL — ABNORMAL LOW (ref 8.9–10.3)
Chloride: 107 mmol/L (ref 101–111)
GFR calc Af Amer: 26 mL/min — ABNORMAL LOW (ref >60)
GFR, EST NON AFRICAN AMERICAN: 23 mL/min — AB (ref >60)
GLUCOSE: 159 mg/dL — AB (ref 65–99)
Potassium: 4.3 mmol/L (ref 3.5–5.1)
SODIUM: 134 mmol/L — AB (ref 135–145)

## 2016-09-27 LAB — CBC
HCT: 37.2 % — ABNORMAL LOW (ref 39.0–52.0)
HEMOGLOBIN: 12.1 g/dL — AB (ref 13.0–17.0)
MCH: 28.4 pg (ref 26.0–34.0)
MCHC: 32.5 g/dL (ref 30.0–36.0)
MCV: 87.3 fL (ref 78.0–100.0)
PLATELETS: 218 10*3/uL (ref 150–400)
RBC: 4.26 MIL/uL (ref 4.22–5.81)
RDW: 14.7 % (ref 11.5–15.5)
WBC: 18.7 10*3/uL — AB (ref 4.0–10.5)

## 2016-09-27 LAB — PHOSPHORUS: Phosphorus: 4.5 mg/dL (ref 2.5–4.6)

## 2016-09-27 LAB — TROPONIN I: Troponin I: 23.58 ng/mL (ref <0.03)

## 2016-09-27 LAB — MAGNESIUM: MAGNESIUM: 2 mg/dL (ref 1.7–2.4)

## 2016-09-27 SURGERY — LEFT HEART CATH AND CORONARY ANGIOGRAPHY

## 2016-09-27 MED ORDER — HEPARIN (PORCINE) IN NACL 100-0.45 UNIT/ML-% IJ SOLN: 1450.0000 [IU]/h | INTRAMUSCULAR | Status: DC

## 2016-09-27 MED ORDER — SODIUM CHLORIDE 0.9% FLUSH: 3.0000 mL | Freq: Two times a day (BID) | INTRAVENOUS | Status: DC

## 2016-09-27 MED ORDER — ACETAMINOPHEN 325 MG PO TABS: 650.0000 mg | ORAL_TABLET | ORAL | Status: DC | PRN

## 2016-09-27 MED ORDER — SODIUM CHLORIDE 0.9 % IV BOLUS (SEPSIS)
500.0000 mL | Freq: Once | INTRAVENOUS | Status: AC
  Administered 2016-09-27: 500 mL via INTRAVENOUS

## 2016-09-27 MED ORDER — SODIUM CHLORIDE 0.9 % IV SOLN
INTRAVENOUS | Status: DC
  Administered 2016-09-27: 16:00:00 via INTRAVENOUS

## 2016-09-27 MED ORDER — SODIUM CHLORIDE 0.9% FLUSH: 3.0000 mL | INTRAVENOUS | Status: DC | PRN

## 2016-09-27 MED ORDER — AMIODARONE LOAD VIA INFUSION
150.0000 mg | Freq: Once | INTRAVENOUS | Status: AC
Start: 2016-09-27 — End: 2016-09-27
  Administered 2016-09-27: 30 mg/h via INTRAVENOUS
  Filled 2016-09-27: qty 83.34

## 2016-09-27 MED ORDER — SODIUM CHLORIDE 0.9 % IV SOLN: 250.0000 mL | INTRAVENOUS | Status: DC | PRN

## 2016-09-27 MED ORDER — LIDOCAINE HCL (PF) 1 % IJ SOLN
INTRAMUSCULAR | Status: AC
  Filled 2016-09-27: qty 30

## 2016-09-27 MED ORDER — LIDOCAINE HCL (PF) 1 % IJ SOLN
INTRAMUSCULAR | Status: DC | PRN
  Administered 2016-09-27: 10 mL

## 2016-09-27 MED ORDER — AMIODARONE HCL IN DEXTROSE 360-4.14 MG/200ML-% IV SOLN
INTRAVENOUS | Status: AC
  Administered 2016-09-27: 30 mg/h via INTRAVENOUS
  Filled 2016-09-27: qty 400

## 2016-09-27 MED ORDER — SODIUM CHLORIDE 0.9 % IV SOLN
INTRAVENOUS | Status: DC
  Administered 2016-09-27: 11:00:00 via INTRAVENOUS

## 2016-09-27 MED ORDER — AMIODARONE HCL IN DEXTROSE 360-4.14 MG/200ML-% IV SOLN: 30.0000 mg/h | INTRAVENOUS | Status: DC

## 2016-09-27 MED ORDER — LIDOCAINE IN D5W 4-5 MG/ML-% IV SOLN
1.0000 mg/min | INTRAVENOUS | Status: DC
  Administered 2016-09-27: 1 mg/min via INTRAVENOUS
  Filled 2016-09-27 (×2): qty 500

## 2016-09-27 MED ORDER — HEPARIN (PORCINE) IN NACL 2-0.9 UNIT/ML-% IJ SOLN
INTRAMUSCULAR | Status: AC
  Filled 2016-09-27: qty 1000

## 2016-09-27 MED ORDER — SODIUM CHLORIDE 0.9 % WEIGHT BASED INFUSION: 1.0000 mL/kg/h | INTRAVENOUS | Status: DC

## 2016-09-27 MED ORDER — SODIUM CHLORIDE 0.9 % WEIGHT BASED INFUSION: 3.0000 mL/kg/h | INTRAVENOUS | Status: DC

## 2016-09-27 MED ORDER — ONDANSETRON HCL 4 MG/2ML IJ SOLN: 4.0000 mg | Freq: Four times a day (QID) | INTRAMUSCULAR | Status: DC | PRN

## 2016-09-27 MED ORDER — ASPIRIN 81 MG PO CHEW: 81.0000 mg | CHEWABLE_TABLET | ORAL | Status: DC

## 2016-09-27 MED ORDER — IOPAMIDOL (ISOVUE-370) INJECTION 76%
INTRAVENOUS | Status: AC
  Filled 2016-09-27: qty 100

## 2016-09-27 MED ORDER — SODIUM CHLORIDE 0.9 % IV SOLN: INTRAVENOUS | Status: AC

## 2016-09-27 MED ORDER — ASPIRIN 81 MG PO CHEW
81.0000 mg | CHEWABLE_TABLET | ORAL | Status: AC
  Administered 2016-09-27: 81 mg via ORAL
  Filled 2016-09-27: qty 1

## 2016-09-27 MED ORDER — METHYLPREDNISOLONE SODIUM SUCC 125 MG IJ SOLR
125.0000 mg | INTRAMUSCULAR | Status: AC
  Administered 2016-09-27: 125 mg via INTRAVENOUS
  Filled 2016-09-27: qty 2

## 2016-09-27 MED ORDER — HEPARIN (PORCINE) IN NACL 2-0.9 UNIT/ML-% IJ SOLN
INTRAMUSCULAR | Status: DC | PRN
  Administered 2016-09-27: 1000 mL

## 2016-09-27 MED ORDER — FAMOTIDINE IN NACL 20-0.9 MG/50ML-% IV SOLN
20.0000 mg | INTRAVENOUS | Status: DC
  Administered 2016-09-27 – 2016-09-28 (×2): 20 mg via INTRAVENOUS
  Filled 2016-09-27: qty 50

## 2016-09-27 MED ORDER — HYDRALAZINE HCL 20 MG/ML IJ SOLN: 10.0000 mg | INTRAMUSCULAR | Status: DC | PRN

## 2016-09-27 MED ORDER — FAMOTIDINE IN NACL 20-0.9 MG/50ML-% IV SOLN
20.0000 mg | INTRAVENOUS | Status: AC
  Filled 2016-09-27: qty 50

## 2016-09-27 MED ORDER — AMIODARONE HCL IN DEXTROSE 360-4.14 MG/200ML-% IV SOLN: 60.0000 mg/h | INTRAVENOUS | Status: DC

## 2016-09-27 MED ORDER — SODIUM CHLORIDE 0.9% FLUSH
3.0000 mL | Freq: Two times a day (BID) | INTRAVENOUS | Status: DC
  Administered 2016-09-27 – 2016-09-28 (×2): 3 mL via INTRAVENOUS

## 2016-09-27 MED ORDER — HEPARIN BOLUS VIA INFUSION
2500.0000 [IU] | Freq: Once | INTRAVENOUS | Status: AC
  Administered 2016-09-27: 2500 [IU] via INTRAVENOUS
  Filled 2016-09-27: qty 2500

## 2016-09-27 MED ORDER — DIPHENHYDRAMINE HCL 50 MG/ML IJ SOLN
25.0000 mg | INTRAMUSCULAR | Status: AC
  Administered 2016-09-27: 25 mg via INTRAVENOUS
  Filled 2016-09-27: qty 1

## 2016-09-27 SURGICAL SUPPLY — 8 items
CATH INFINITI 5FR MULTPACK ANG (CATHETERS) ×2 IMPLANT
DEVICE CLOSURE MYNXGRIP 5F (Vascular Products) ×2 IMPLANT
KIT HEART LEFT (KITS) ×3 IMPLANT
PACK CARDIAC CATHETERIZATION (CUSTOM PROCEDURE TRAY) ×3 IMPLANT
SHEATH PINNACLE 5F 10CM (SHEATH) ×3 IMPLANT
TRANSDUCER W/STOPCOCK (MISCELLANEOUS) ×3 IMPLANT
TUBING CIL FLEX 10 FLL-RA (TUBING) ×3 IMPLANT
WIRE EMERALD 3MM-J .035X150CM (WIRE) ×2 IMPLANT

## 2016-09-27 NOTE — Progress Notes (Addendum)
Patient Name: Manuel [name] Date of Encounter: 10/05/2016  Active Problems:   Cardiogenic shock ([place])   Length of Stay: 1  SUBJECTIVE  The patient feels mild discomfort in his neck but no chest pain, SOB or diaphoresis.   CURRENT MEDS . cefTRIAXone (ROCEPHIN)  IV  1 g Intravenous Q24H  . clopidogrel  75 mg Oral Daily  . famotidine (PEPCID) IV  20 mg Intravenous Q12H  . nitroGLYCERIN  0.5 inch Topical Q6H   . heparin 1,350 Units/hr (10/03/2016 0831)  . norepinephrine (LEVOPHED) Adult infusion 5 mcg/min (09/29/2016 0900)   OBJECTIVE  Vitals:   10/05/2016 0845 10/19/2016 0900 10/02/2016 0915 10/11/2016 0930  BP: 116/65 127/66 119/65 111/63  Pulse: 88 87 87 88  Resp: 15 18 19 18   Temp:      TempSrc:      SpO2: 96% 98% 96% 97%  Weight:      Height:        Intake/Output Summary (Last 24 hours) at 10/16/2016 1043 Last data filed at 10/08/2016 0900  Gross per 24 hour  Intake            653.8 ml  Output              225 ml  Net            428.8 ml   Filed Weights   10/10/2016 1518 10/05/2016 1620 10/15/2016 1832  Weight: 195 lb (88.5 kg) 192 lb (87.1 kg) 196 lb 3.4 oz (89 kg)   PHYSICAL EXAM  General: Pleasant, NAD. Neuro: Alert and oriented X 3. Moves all extremities spontaneously. Psych: Normal affect. HEENT:  Normal  Neck: Supple without bruits or JVD. Lungs:  Resp regular and unlabored, CTA. Heart: RRR no s3, s4, or murmurs. Abdomen: Soft, non-tender, non-distended, BS + x 4.  Extremities: No clubbing, cyanosis or edema. DP/PT/Radials 2+ and equal bilaterally.  Accessory Clinical Findings  CBC  Recent Labs  10/07/2016 1537 10/19/2016 1549 09/26/2016 0149  WBC 15.9*  --  18.7*  NEUTROABS 12.2*  --   --   HGB 12.2* 12.9* 12.1*  HCT 37.5* 38.0* 37.2*  MCV 87.8  --  87.3  PLT 177  --  99991111   Basic Metabolic Panel  Recent Labs  10/02/2016 1537 10/07/2016 1549 10/06/2016 0149  NA 136 139 134*  K 4.4 4.4 4.3  CL 105 107 107  CO2 21*  --  17*  GLUCOSE 154* 149* 159*  BUN  45* 40* 43*  CREATININE 2.60* 2.20* 2.32*  CALCIUM 8.8*  --  7.9*  MG  --   --  2.0  PHOS  --   --  4.5    Recent Labs  10/17/2016 2057 09/27/16 0149  TROPONINI 29.27* 23.58*   Radiology/Studies  No results found.  TELE: a-fib with controlled ventricular rate  ECG:     ASSESSMENT AND PLAN  Manuel [name] is a 82 y.o. male with a history significant for HTN, AF, and documented CAD who presented with several days of fatigue, GI symptoms, and subsequent left shoulder and chest pain with ECG concerning for inferior STEMI.  The initial presentation was discussed with the on-call interventionalist and cardiac catheterization was deferred given the duration of symptoms and complicated clinical picture.  The pt is now CP free and hemodynamically stable on low-dose norepinephrine gtt.  Agree with plan for continued conservative medical management with ASA and systemic anticoagulation while deferring invasive approach.  # Inferior STEMI; cardiac cath deferred,  currently CP free, continue medical management, discussed with the patient and his granddaughter again, and given his age and acute on chronic kidney failure we will wait, continue medical management, give fluids, order TTE and consider cath if kidneys improve. The patient a good looking 82 year old and was very active until this admission.  - ASA 81mg  PO QDAY - heparin gtt - suggest adding P2Y12 inhibition with Clopidogrel 300mg  PO x 1 and then 75mg  PO QDAY - consider adding Atorvastatin 40mg  PO QDAY in the acute setting for plaque stabilization although long-term statin therapy at age 77 may not be clinically useful - defer beta-blockade given hypotension  - TTE in am to assess wall-motion  # Shock; suspect cardiogenic etiology although with possible vasodilatory component - vasopressor support as per CCM  # Atrial Fibrillation; rate-controlled - hold home Rivaroxaban given heparin gtt as per above  # AKI; baseline creatine  approximately 1.2 and now 2.6-->start NaCl 50 cc/hr   Signed, Ena Dawley MD, [facility] 10/16/2016   Addendum:  The patient went into sustained VT with ventricular rate 160 BPM, he is hemodynamically stable on 3 mcg of Levophed, with chest pain 5/10, he  Was in VT for total of 21 minutes, after he came back to SR his chest pain has improved but is still present. We will discussed possible cardiac cath with Dr Gwenlyn Found who is willing to proceed. I talked to the patient and his wife and they would wish to proceed with a cath. Risk and benefits are explained, including possible worsening kidney failure, stroke and death. They understand.  He has been hydrated with iv NaCl since this am. The patient is in critical condition. Total time spent with the patient 60 minutes.   Ena Dawley 09/27/2016

## 2016-09-27 NOTE — Progress Notes (Signed)
Rhythm now a fib with rate in 80s.  Dr. Meda Coffee at bedside.  Will continue to monitor pt closely.

## 2016-09-27 NOTE — Progress Notes (Signed)
[name] Progress Note Patient Name: Manuel [name] DOB: 12-13-1921 MRN: [id number]   Date of Service  10/05/2016  HPI/Events of Note  Oliguria Prostate problems baseline  eICU Interventions  foley     Intervention Category Intermediate Interventions: Oliguria - evaluation and management  Simonne Maffucci 10/11/2016, 5:07 AM

## 2016-09-27 NOTE — H&P (View-Only) (Signed)
Patient Name: Manuel [name] Date of Encounter: 10/03/2016  Active Problems:   Cardiogenic shock ([place])   Length of Stay: 1  SUBJECTIVE  The patient feels mild discomfort in his neck but no chest pain, SOB or diaphoresis.   CURRENT MEDS . cefTRIAXone (ROCEPHIN)  IV  1 g Intravenous Q24H  . clopidogrel  75 mg Oral Daily  . famotidine (PEPCID) IV  20 mg Intravenous Q12H  . nitroGLYCERIN  0.5 inch Topical Q6H   . heparin 1,350 Units/hr (10/16/2016 0831)  . norepinephrine (LEVOPHED) Adult infusion 5 mcg/min (10/13/2016 0900)   OBJECTIVE  Vitals:   09/26/2016 0845 10/01/2016 0900 09/25/2016 0915 10/02/2016 0930  BP: 116/65 127/66 119/65 111/63  Pulse: 88 87 87 88  Resp: 15 18 19 18   Temp:      TempSrc:      SpO2: 96% 98% 96% 97%  Weight:      Height:        Intake/Output Summary (Last 24 hours) at 10/15/2016 1043 Last data filed at 10/06/2016 0900  Gross per 24 hour  Intake            653.8 ml  Output              225 ml  Net            428.8 ml   Filed Weights   10/24/2016 1518 09/25/2016 1620 10/21/2016 1832  Weight: 195 lb (88.5 kg) 192 lb (87.1 kg) 196 lb 3.4 oz (89 kg)   PHYSICAL EXAM  General: Pleasant, NAD. Neuro: Alert and oriented X 3. Moves all extremities spontaneously. Psych: Normal affect. HEENT:  Normal  Neck: Supple without bruits or JVD. Lungs:  Resp regular and unlabored, CTA. Heart: RRR no s3, s4, or murmurs. Abdomen: Soft, non-tender, non-distended, BS + x 4.  Extremities: No clubbing, cyanosis or edema. DP/PT/Radials 2+ and equal bilaterally.  Accessory Clinical Findings  CBC  Recent Labs  10/06/2016 1537 10/21/2016 1549 10/17/2016 0149  WBC 15.9*  --  18.7*  NEUTROABS 12.2*  --   --   HGB 12.2* 12.9* 12.1*  HCT 37.5* 38.0* 37.2*  MCV 87.8  --  87.3  PLT 177  --  99991111   Basic Metabolic Panel  Recent Labs  09/27/2016 1537 10/04/2016 1549 09/27/16 0149  NA 136 139 134*  K 4.4 4.4 4.3  CL 105 107 107  CO2 21*  --  17*  GLUCOSE 154* 149* 159*  BUN  45* 40* 43*  CREATININE 2.60* 2.20* 2.32*  CALCIUM 8.8*  --  7.9*  MG  --   --  2.0  PHOS  --   --  4.5    Recent Labs  10/05/2016 2057 09/27/16 0149  TROPONINI 29.27* 23.58*   Radiology/Studies  No results found.  TELE: a-fib with controlled ventricular rate  ECG:     ASSESSMENT AND PLAN  DAVAUGHN MALLAK is a 82 y.o. male with a history significant for HTN, AF, and documented CAD who presented with several days of fatigue, GI symptoms, and subsequent left shoulder and chest pain with ECG concerning for inferior STEMI.  The initial presentation was discussed with the on-call interventionalist and cardiac catheterization was deferred given the duration of symptoms and complicated clinical picture.  The pt is now CP free and hemodynamically stable on low-dose norepinephrine gtt.  Agree with plan for continued conservative medical management with ASA and systemic anticoagulation while deferring invasive approach.  # Inferior STEMI; cardiac cath deferred,  currently CP free, continue medical management, discussed with the patient and his granddaughter again, and given his age and acute on chronic kidney failure we will wait, continue medical management, give fluids, order TTE and consider cath if kidneys improve. The patient a good looking 82 year old and was very active until this admission.  - ASA 81mg  PO QDAY - heparin gtt - suggest adding P2Y12 inhibition with Clopidogrel 300mg  PO x 1 and then 75mg  PO QDAY - consider adding Atorvastatin 40mg  PO QDAY in the acute setting for plaque stabilization although long-term statin therapy at age 50 may not be clinically useful - defer beta-blockade given hypotension  - TTE in am to assess wall-motion  # Shock; suspect cardiogenic etiology although with possible vasodilatory component - vasopressor support as per CCM  # Atrial Fibrillation; rate-controlled - hold home Rivaroxaban given heparin gtt as per above  # AKI; baseline creatine  approximately 1.2 and now 2.6-->start NaCl 50 cc/hr   Signed, Ena Dawley MD, [facility] 10/17/2016   Addendum:  The patient went into sustained VT with ventricular rate 160 BPM, he is hemodynamically stable on 3 mcg of Levophed, with chest pain 5/10, he  Was in VT for total of 21 minutes, after he came back to SR his chest pain has improved but is still present. We will discussed possible cardiac cath with Dr Gwenlyn Found who is willing to proceed. I talked to the patient and his wife and they would wish to proceed with a cath. Risk and benefits are explained, including possible worsening kidney failure, stroke and death. They understand.  He has been hydrated with iv NaCl since this am. The patient is in critical condition. Total time spent with the patient 60 minutes.   Ena Dawley 09/27/2016

## 2016-09-27 NOTE — Progress Notes (Signed)
Echocardiogram 2D Echocardiogram has been performed.  Manuel [name] 10/16/2016, 12:04 PM

## 2016-09-27 NOTE — Progress Notes (Signed)
ANTICOAGULATION CONSULT NOTE - Follow-up Consult  Pharmacy Consult for heparin Indication: ACS/STEMI  Allergies  Allergen Reactions  . Adhesive [Tape]     Plastic tape= tears skin  . Ivp Dye [Iodinated Diagnostic Agents] Other (See Comments)    Doesn't remember reaction but it was severe  . Penicillins Hives and Itching  . Rofecoxib Other (See Comments)     elevated BP (Vioxx)  . Sulfamethoxazole Nausea And Vomiting  . Sulfonamide Derivatives Hives and Itching  . Codeine Other (See Comments) and Hives    Pass out    Patient Measurements: Height: 6\' 2"  (188 cm) Weight: 196 lb 3.4 oz (89 kg) IBW/kg (Calculated) : 82.2   Vital Signs: Temp: 97.5 F (36.4 C) (12/04 0840) Temp Source: Oral (12/04 0840) BP: 111/63 (12/04 0930) Pulse Rate: 88 (12/04 0930)  Labs:  Recent Labs  10/02/2016 1535  10/24/2016 1537 10/23/2016 1549 10/09/2016 2057 10/05/2016 0149 10/15/2016 1049  HGB  --   < > 12.2* 12.9*  --  12.1*  --   HCT  --   --  37.5* 38.0*  --  37.2*  --   PLT  --   --  177  --   --  218  --   APTT 32  --   --   --   --   --   --   LABPROT 16.2*  --   --   --   --   --   --   INR 1.29  --   --   --   --   --   --   HEPARINUNFRC  --   --   --   --   --  <0.10* 0.26*  CREATININE  --   --  2.60* 2.20*  --  2.32*  --   TROPONINI  --   --   --   --  29.27* 23.58*  --   < > = values in this interval not displayed.  Estimated Creatinine Clearance (by C-G formula based on SCr of 2.32 mg/dL (H)) Male: 19 mL/min Male: 23.6 mL/min   . sodium chloride 75 mL/hr at 10/08/2016 1126  . heparin    . norepinephrine (LEVOPHED) Adult infusion 5 mcg/min (10/10/2016 0900)     Assessment: 82 yo male complaints of fatigue, nausea, neck pain, and elevated WBC (16.8), low lymphocytes  (12.9).  Hx of Afib but hasn't taken his Xarelto since sometime in October due to cost issues. Pt found to have STEMI - cath deferred and receiving medical management.   Heparin level remains subtherapeutic on  heparin at 1350 units/hr. No issues with line or bleeding reported per RN. CBC stable.  Goal of Therapy:  Heparin level 0.3-0.7 units/ml Monitor platelets by anticoagulation protocol   Plan:  Increase heparin drip to 1450 units/hr Heparin level in 8 hours. Daily heparin level and CBC. F/u plans for chronic anticoagulation.  Uvaldo Rising, BCPS  Clinical Pharmacist Pager [phone]  10/23/2016 11:53 AM

## 2016-09-27 NOTE — Progress Notes (Signed)
ANTICOAGULATION CONSULT NOTE - Follow-up Consult  Pharmacy Consult for heparin Indication: ACS/STEMI  Allergies  Allergen Reactions  . Adhesive [Tape]     Plastic tape= tears skin  . Ivp Dye [Iodinated Diagnostic Agents] Other (See Comments)    Doesn't remember reaction but it was severe  . Penicillins Hives and Itching  . Rofecoxib Other (See Comments)     elevated BP (Vioxx)  . Sulfamethoxazole Nausea And Vomiting  . Sulfonamide Derivatives Hives and Itching  . Codeine Other (See Comments) and Hives    Pass out    Patient Measurements: Height: 6\' 2"  (188 cm) Weight: 196 lb 3.4 oz (89 kg) IBW/kg (Calculated) : 82.2   Vital Signs: Temp: 98 F (36.7 C) (12/03 2345) Temp Source: Oral (12/03 2345) BP: 122/54 (12/04 0045) Pulse Rate: 92 (12/04 0045)  Labs:  Recent Labs  10/20/2016 1535  10/11/2016 1537 10/16/2016 1549 10/07/2016 2057 10/19/2016 0149  HGB  --   < > 12.2* 12.9*  --  12.1*  HCT  --   --  37.5* 38.0*  --  37.2*  PLT  --   --  177  --   --  218  APTT 32  --   --   --   --   --   LABPROT 16.2*  --   --   --   --   --   INR 1.29  --   --   --   --   --   HEPARINUNFRC  --   --   --   --   --  <0.10*  CREATININE  --   --  2.60* 2.20*  --   --   TROPONINI  --   --   --   --  29.27*  --   < > = values in this interval not displayed.  Estimated Creatinine Clearance (by C-G formula based on SCr of 2.2 mg/dL (H)) Male: 20 mL/min Male: 24.9 mL/min  Assessment: 82 yo male complaints of fatigue, nausea, neck pain, and elevated WBC (16.8), low lymphocytes  (12.9).  Hx of Afib but hasn't taken his xarelto since sometime in October due to cost issues. Pt found to have STEMI - cath deferred and receiving medical management. Heparin level undetectable on 1000 units/hr. No issues with line or bleeding reported per RN. CBC stable.  Goal of Therapy:  Heparin level 0.3-0.7 units/ml Monitor platelets by anticoagulation protocol   Plan:  Rebolus heparin 2500 units Increase  heparin drip to 1350 units/hr Heparin level in 8 hours  Sherlon Handing, PharmD, BCPS Clinical pharmacist, pager [phone] 10/19/2016, 2:58 AM

## 2016-09-27 NOTE — Addendum Note (Signed)
Addended by: Lianne Cure A on: 10/10/2016 08:20 AM   Modules accepted: Orders

## 2016-09-27 NOTE — Progress Notes (Signed)
Called by RN to the patient's bedside. Noted to be in what appears to be sustained VT. Started at 1404. Pt without complaint at the time. EKG done, noting VT, rate in the 130s. Planned to started amiodarone, but patient then converted to SR just prior to started IV amio. Discussed with Dr. Meda Coffee. Orders to start lidocaine drip. Pt did briefly c/o mild chest pressure during elevated rate. Lung clear on exam, A&O4, radial and PD pulses noted, blood pressure in the 80s, levophed was resumed. Dr. Meda Coffee at the bedside to discuss the possibility of cardiac cath with patient.

## 2016-09-27 NOTE — Progress Notes (Signed)
PULMONARY / CRITICAL CARE MEDICINE   Name: HANSON PASIERB MRN: [id number] DOB: 10-12-1922    ADMISSION DATE:  10/12/2016 CONSULTATION DATE:   REFERRING MD:  EDP  CHIEF COMPLAINT:  Chest/neck pain  Brief:   Mr. Kernaghan is a highly functional and independent 54M who lives at home with his wife with PMH significant for CAD, peripheral neuropathy, prior GI bleeding, HTN, prior stroke (2015) and chronic atrial fibrillation who presented to urgent care 12/3 with complaints of epigastric and substernal chest discomfort that radiated to both sides of his neck associated with diaphoresis and chills. Symptom onset was approximately 36-48h ago. He initially thought he was just having bad reflux with retrosternal burning and some mild stomach upset. The pain abated and he went about his business, but had recurrent pain last night and took something for indigestion. He also took some tylenol. He gets constipated with tylenol, so he also took some stool softeners. His pain did not improve and he got very cold. He laid down and awoke with pain radiating to both sides of his neck. He then presented for evaluation. From urgent care he was sent to the [facility] ED. Initial workup revealed troponin >35, lactate 2.63, WBC 15.9, Cr 2.6, BUN 45, INR 1.29 and glucose 150. EKG showed ST elevation in the inferior leads. Cardiology was consulted, but did not think he was a good candidate for catheterization given duration of symptoms, renal function and documented allergy to IVP contrast dye. He was given ASA, heparinized and sent here for further care. He was noted to be hypotensive and was started on peripheral levophed. Blood cultures were also drawn and he was started on Rocephin.  He denies any associated infectious symptoms - no fever / cough /sputum / nausea / vomiting / diarrhea / urinary symptoms. No orthopnea or PND. No new dyspnea on exertion. The swelling in his lower extremities has been a little worse  lately, but he attributes this to his neuropathy and poor circulation.    Allergies  Allergen Reactions  . Adhesive [Tape]     Plastic tape= tears skin  . Ivp Dye [Iodinated Diagnostic Agents] Other (See Comments)    Doesn't remember reaction but it was severe  . Penicillins Hives and Itching  . Rofecoxib Other (See Comments)     elevated BP (Vioxx)  . Sulfamethoxazole Nausea And Vomiting  . Sulfonamide Derivatives Hives and Itching  . Codeine Other (See Comments) and Hives    Pass out    No current facility-administered medications on file prior to encounter.    Current Outpatient Prescriptions on File Prior to Encounter  Medication Sig  . acetaminophen (TYLENOL) 650 MG CR tablet Take 650 mg by mouth every 8 (eight) hours as needed for pain (give with docusate for stool softner).  . Cholecalciferol (VITAMIN D) 2000 UNITS tablet Take 2,000 Units by mouth daily.  . Coenzyme Q10 (COQ10) 200 MG CAPS Take 200 mg by mouth daily.  . diphenhydrAMINE (SOMINEX) 25 MG tablet Take 50 mg by mouth at bedtime. Reported on 04/20/2016  . furosemide (LASIX) 20 MG tablet Take 1 tablet (20 mg total) by mouth daily. Take one tablet (20 mg) by mouth once every other day (Patient taking differently: Take 20 mg by mouth every other day. Take one tablet (20 mg) by mouth once every other day)  . lisinopril (PRINIVIL,ZESTRIL) 20 MG tablet Take 1 tablet (20 mg total) by mouth daily.  . Multiple Vitamin (MULTIVITAMIN WITH MINERALS) TABS tablet Take 1  tablet by mouth daily. NutraLite  . omeprazole (PRILOSEC) 40 MG capsule Take 40 mg by mouth every morning. Reported on 12/24/2015  . vitamin C (ASCORBIC ACID) 500 MG tablet Take 250 mg by mouth 2 (two) times daily.   Alveda Reasons 20 MG TABS tablet take 1 tablet by mouth once daily with SUPPER  . HYDROcodone-acetaminophen (NORCO/VICODIN) 5-325 MG per tablet Take 1 tablet by mouth every 4 (four) hours as needed for moderate pain. (Patient not taking: Reported on 10/09/2016)   . ondansetron (ZOFRAN ODT) 8 MG disintegrating tablet Take 1 tablet (8 mg total) by mouth every 8 (eight) hours as needed for nausea or vomiting. (Patient not taking: Reported on 10/21/2016)    SUBJECTIVE:  Elderly male, no acute distress. On pressors.   VITAL SIGNS: BP 111/63   Pulse 88   Temp 98 F (36.7 C) (Oral)   Resp 18   Ht 6\' 2"  (1.88 m)   Wt 89 kg (196 lb 3.4 oz)   SpO2 97%   BMI 25.19 kg/m   HEMODYNAMICS:    VENTILATOR SETTINGS:    INTAKE / OUTPUT: I/O last 3 completed shifts: In: 561.8 [P.O.:200; I.V.:361.8] Out: 225 [Urine:225]  PHYSICAL EXAMINATION: Physical Exam: Temp:  [97.5 F (36.4 C)-98.1 F (36.7 C)] 98 F (36.7 C) (12/04 1100) Pulse Rate:  [63-105] 88 (12/04 0930) Resp:  [15-31] 18 (12/04 0930) BP: (71-160)/(31-85) 111/63 (12/04 0930) SpO2:  [91 %-100 %] 97 % (12/04 0930) Weight:  [87.1 kg (192 lb)-89 kg (196 lb 3.4 oz)] 89 kg (196 lb 3.4 oz) (12/03 1832)  General Well nourished, well developed, no apparent distress  HEENT Normocephalic   Pulmonary Clear to auscultation bilaterally, unlabored on room air   Cardiovascular Normal rate, irregularly irregular rhythm. S1, S2. No m/r/g.   Abdomen Soft, non-tender, non-distended, active bowel sounds,   Musculoskeletal No gross abnormalities.  Lymphatics No cervical, supraclavicular or axillary adenopathy.   Neurologic Alert, oriented, follow commands   Skin/Integuement Warm, dry, intact      LABS:  BMET  Recent Labs Lab 10/24/2016 1537 10/09/2016 1549 10/04/2016 0149  NA 136 139 134*  K 4.4 4.4 4.3  CL 105 107 107  CO2 21*  --  17*  BUN 45* 40* 43*  CREATININE 2.60* 2.20* 2.32*  GLUCOSE 154* 149* 159*    Electrolytes  Recent Labs Lab 10/22/2016 1537 09/24/2016 0149  CALCIUM 8.8* 7.9*  MG  --  2.0  PHOS  --  4.5    CBC  Recent Labs Lab 10/13/2016 1537 10/08/2016 1549 10/16/2016 0149  WBC 15.9*  --  18.7*  HGB 12.2* 12.9* 12.1*  HCT 37.5* 38.0* 37.2*  PLT 177  --  218     Coag's  Recent Labs Lab 09/25/2016 1535  APTT 32  INR 1.29    Sepsis Markers  Recent Labs Lab 09/30/2016 1549 10/18/2016 2057  LATICACIDVEN 2.63* 2.1*    ABG No results for input(s): PHART, PCO2ART, PO2ART in the last 168 hours.  Liver Enzymes No results for input(s): AST, ALT, ALKPHOS, BILITOT, ALBUMIN in the last 168 hours.  Cardiac Enzymes  Recent Labs Lab 09/25/2016 2057 10/23/2016 0149  TROPONINI 29.27* 23.58*    Glucose No results for input(s): GLUCAP in the last 168 hours.  Imaging No results found.   STUDIES:  12/4 Echo >>   CULTURES: 12/3 Blood x 2 >>>  ANTIBIOTICS: Rocephin 12/3 >   SIGNIFICANT EVENTS: 12/3 > Admit Inferior STEMI   LINES/TUBES: PIV  DISCUSSION: Mr. Giannino  is a 50M admitted with STEMI and cardiogenic shock requiring pressors. He is a poor candidate for PCI secondary to multiple factors (renal function, age, contrast allergy, duration of symptoms). With his elevated WBC, there may be some concern for sepsis as a confounding etiology, but this seems unlikely based on his clinical history. Nevertheless, will follow blood cultures, obtain UA/cx and continue rocephin for now. He needs a formal TTE and we will await formal cardiology recommendations. Continue heparin. Titrate levo to MAP 65.   ASSESSMENT / PLAN:  CARDIOVASCULAR A:  STEMI - inferior  Shock, presumed cardiogenic Chronic atrial fibrillation P:  Continue pressor support for MAP > 65 Bolus 500 ml  D/C Nitro paste  Continue heparin  Cardiology following   RENAL A:   Acute kidney injury - Cr 2.6 from baseline ~ 1.2 - likely secondary to shock state Lactic acidosis P:   Continue pressors for MAP > 65 Bolus 500 ml  Monitor UOP  INFECTIOUS A:   Concern for sepsis, although no clear source P:   Continue empiric rocephin Follow blood cultures  PULMONARY A: No acute issues P:   Supplemental O2 if needed   GASTROINTESTINAL A:   Hx of GERD and  constipation P:   Famotidine  Bowel regimen  HEMATOLOGIC A:   Elevated WBC - likely reactive P:  Trend CBC  ENDOCRINE A:   Hyperglycemia - likely stress response  P:   CBGs and SSI if needed  NEUROLOGIC A:   No acute issues Peripheral neuropathy Hx of stroke P:   RASS goal: 0    FAMILY  - Updates: Discussion with patient at bedside. He has not previously thought about or discussed code status and would like to remain full code until he can talk about it with his family. Patient will remain FULL CODE for now.   - Inter-disciplinary family meet or Palliative Care meeting due by:  day Annabella, AG-ACNP [facility]  Pgr: [phone]  PCCM Pgr: [phone]  Attending Note:  I have examined patient, reviewed labs, studies and notes. I have discussed the case with Beaulah Corin, and I agree with the data and plans as amended above.  Pt is s/p STEMI c/b cardiogenic shock.  On my eval he is comfortable, is CP free. Remains on norepi 2. We will give some additional NS, attempt to remove his NTG paste and see if he tolerates. Appreciate cardiology eval - we are planning to manage conservatively, heparin + ASA.   Independent critical care time is 31 minutes.   Baltazar Apo, MD, PhD 10/02/2016, 4:35 PM [facility] [phone] or if no answer [phone]

## 2016-09-27 NOTE — Progress Notes (Signed)
Per monitor pt in v tach, rate 160.  Pt awake, alert, oriented, following commands, pulses palpable 1+.  Reino Bellis, NP paged to come see pt.  Zoll pads placed on pt.  Will continue to monitor pt closely.

## 2016-09-27 NOTE — Interval H&P Note (Signed)
Cath Lab Visit (complete for each Cath Lab visit)  Clinical Evaluation Leading to the Procedure:   ACS: Yes.    Non-ACS:    Anginal Classification: CCS IV  Anti-ischemic medical therapy: Minimal Therapy (1 class of medications)  Non-Invasive Test Results: No non-invasive testing performed  Prior CABG: No previous CABG      History and Physical Interval Note:  10/04/2016 5:07 PM  Manuel [name]  has presented today for surgery, with the diagnosis of cp  The various methods of treatment have been discussed with the patient and family. After consideration of risks, benefits and other options for treatment, the patient has consented to  Procedure(s): Left Heart Cath and Coronary Angiography (N/A) as a surgical intervention .  The patient's history has been reviewed, patient examined, no change in status, stable for surgery.  I have reviewed the patient's chart and labs.  Questions were answered to the patient's satisfaction.     Quay Burow

## 2016-09-27 NOTE — Progress Notes (Signed)
Dr. Lake Bells notified of decreased UOP and bladder scan result, order received

## 2016-09-27 NOTE — Care Management Note (Addendum)
Case Management Note  Patient Details  Name: RENLEY BAROUSSE MRN: [id number] Date of Birth: 06-Jul-1922  Subjective/Objective:     Adm w cardiogenic shock               Action/Plan: lives w fam, pcp dr Kenton Kingfisher   Expected Discharge Date:  2016-09-29               Expected Discharge Plan:  Home/Self Care  In-House Referral:     Discharge planning Services  CM Consult, Medication Assistance  Post Acute Care Choice:    Choice offered to:     DME Arranged:    DME Agency:     HH Arranged:    HH Agency:     Status of Service:  In process, will continue to follow  If discussed at Long Length of Stay Meetings, dates discussed:    Additional Comments:spoke w pharm and pt had been on xarelto. He had hit donut hole and was unable to pay out of pocket for xarelto. Gave pt 30day free xarelto card. He should start over on medicare d first of year. Gave pt pt assist form for xarelto for him to fill out and and get md to sign.     S/W ROBIN @ [facility] # [phone]   XARELTO 20 DAILY 30/30TAB   COVER- YES  CO-PAY- $ 13.59  TIER- 2 DRUG  PRIOR APPROVAL - YES # [phone]  PHARMACY : [facility]  MAIL-ORDER FOR 90 DAY SUPPLY $37.00     Lacretia Leigh, RN 10/09/2016, 1:48 PM

## 2016-09-28 ENCOUNTER — Inpatient Hospital Stay (HOSPITAL_COMMUNITY): Payer: Medicare Other | Admitting: Certified Registered Nurse Anesthetist

## 2016-09-28 ENCOUNTER — Encounter (HOSPITAL_COMMUNITY): Payer: Self-pay | Admitting: Cardiovascular Disease

## 2016-09-28 LAB — CBC
HEMATOCRIT: 35 % — AB (ref 39.0–52.0)
HEMOGLOBIN: 11.6 g/dL — AB (ref 13.0–17.0)
MCH: 28.5 pg (ref 26.0–34.0)
MCHC: 33.1 g/dL (ref 30.0–36.0)
MCV: 86 fL (ref 78.0–100.0)
Platelets: 208 10*3/uL (ref 150–400)
RBC: 4.07 MIL/uL — ABNORMAL LOW (ref 4.22–5.81)
RDW: 14.8 % (ref 11.5–15.5)
WBC: 10.1 10*3/uL (ref 4.0–10.5)

## 2016-09-28 LAB — BASIC METABOLIC PANEL
ANION GAP: 13 (ref 5–15)
BUN: 51 mg/dL — ABNORMAL HIGH (ref 6–20)
CHLORIDE: 108 mmol/L (ref 101–111)
CO2: 13 mmol/L — AB (ref 22–32)
Calcium: 8 mg/dL — ABNORMAL LOW (ref 8.9–10.3)
Creatinine, Ser: 2.24 mg/dL — ABNORMAL HIGH (ref 0.61–1.24)
GFR calc non Af Amer: 24 mL/min — ABNORMAL LOW (ref >60)
GFR, EST AFRICAN AMERICAN: 28 mL/min — AB (ref >60)
Glucose, Bld: 192 mg/dL — ABNORMAL HIGH (ref 65–99)
POTASSIUM: 5 mmol/L (ref 3.5–5.1)
Sodium: 134 mmol/L — ABNORMAL LOW (ref 135–145)

## 2016-09-28 LAB — MAGNESIUM: Magnesium: 2.1 mg/dL (ref 1.7–2.4)

## 2016-09-28 LAB — PHOSPHORUS: PHOSPHORUS: 4.5 mg/dL (ref 2.5–4.6)

## 2016-09-28 MED ORDER — HEPARIN (PORCINE) IN NACL 100-0.45 UNIT/ML-% IJ SOLN
1350.0000 [IU]/h | INTRAMUSCULAR | Status: DC
  Administered 2016-09-28: 1350 [IU]/h via INTRAVENOUS
  Filled 2016-09-28: qty 250

## 2016-09-28 MED ORDER — SODIUM CHLORIDE 0.9 % IV SOLN
INTRAVENOUS | Status: DC
  Administered 2016-09-28: via INTRAVENOUS

## 2016-09-28 MED ORDER — CARVEDILOL 3.125 MG PO TABS
3.1250 mg | ORAL_TABLET | Freq: Two times a day (BID) | ORAL | Status: DC
  Administered 2016-09-28: 3.125 mg via ORAL
  Filled 2016-09-28: qty 1

## 2016-09-28 MED ORDER — HEPARIN BOLUS VIA INFUSION
4000.0000 [IU] | Freq: Once | INTRAVENOUS | Status: AC
  Administered 2016-09-28: 4000 [IU] via INTRAVENOUS
  Filled 2016-09-28: qty 4000

## 2016-09-28 MED FILL — Medication: Qty: 1 | Status: AC

## 2016-09-30 ENCOUNTER — Telehealth: Payer: Self-pay

## 2016-09-30 NOTE — Telephone Encounter (Signed)
On 09/30/2016 I received a death certificate from [place] (original). The death certificate is for burial. The patient is a patient of Doctor Byrum. The death certificate will be taken to [facility]) this am for signature.  On 10-26-2016 I received the death certificate back from Doctor Byrum. I got the death certificate ready and called the funeral home to let them know the death certificate is ready for pickup.

## 2016-10-01 LAB — CULTURE, BLOOD (ROUTINE X 2)
CULTURE: NO GROWTH
CULTURE: NO GROWTH

## 2016-10-08 ENCOUNTER — Ambulatory Visit: Payer: Medicare Other | Admitting: Cardiovascular Disease

## 2016-10-15 LAB — CUP PACEART REMOTE DEVICE CHECK
MDC IDC PG IMPLANT DT: 20150720
MDC IDC SESS DTM: 20171107014255

## 2016-10-15 NOTE — Progress Notes (Signed)
Carelink summary report received. Battery status OK. Normal device function. No new symptom episodes, tachy episodes, brady, or pause episodes. 434 AF 4.2% +Xarelto Some ECGs appear SR w/ PACs, PVCs. Some ECGs appear AF. Monthly summary reports and ROV/PRN

## 2016-10-25 NOTE — Anesthesia Procedure Notes (Signed)
Procedure Name: Intubation Date/Time: 14-Oct-2016 5:20 PM Performed by: Candis Shine Pre-anesthesia Checklist: Patient identified, Emergency Drugs available, Suction available and Patient being monitored Oxygen Delivery Method: Ambu bag Preoxygenation: Pre-oxygenation with 100% oxygen Ventilation: Mask ventilation without difficulty Laryngoscope Size: McGraph and 4 Grade View: Grade I Tube type: Subglottic suction tube Tube size: 7.5 mm Number of attempts: 1 Airway Equipment and Method: Stylet Placement Confirmation: ETT inserted through vocal cords under direct vision,  positive ETCO2 and breath sounds checked- equal and bilateral Secured at: 22 cm Tube secured with: tube holder placed by RT. Dental Injury: Teeth and Oropharynx as per pre-operative assessment

## 2016-10-25 NOTE — Progress Notes (Signed)
Pt was sitting on toilet. Pt went into vtach and became unresponsive. Assisted to floor and code called. Code ended at 1740, pt pronounced dead. Family now at bedside. Pronounced by absence of heart sounds by [employer] and [employer]. Manuel [name]

## 2016-10-25 NOTE — Code Documentation (Signed)
PCCM Attending Note: While rounding in the ICU a CODE BLUE was initiated. I went to bedside to assist staff. Patient was found to be in ventricular tachycardia and unconscious on the bathroom floor. Patient was cardioverted back into normal sinus rhythm quickly and pulse was regained. Patient then briefly became conscious. Amiodarone IV push was administered. After a short period of time the patient went into pulseless electrical activity and lost consciousness again. CPR was initiated and epinephrine 1 mg IV was administered. Resuscitation was continued. Anesthesia performed emergent endotracheal intubation. Despite continued aggressive resuscitation, bolus bicarbonate, & bolus epinephrine the patient remained in pulseless electrical activity. He failed to regain sustainable pulse during our entire resuscitative efforts. In the course of the code and I did speak with the patient's granddaughter via phone recommending that she bring the patient's wife to bedside immediately. I also spoke with the patient's other granddaughter and other family member at bedside during the course of the code as well as after the code to convey my sympathies. They have requested hospital chaplain who will be contacted by staff.   Sonia Baller Ashok Cordia, M.D. [facility] Pager:  [phone] After 3pm or if no response, call [phone] 6:04 PM October 11, 2016

## 2016-10-25 NOTE — Discharge Summary (Signed)
PULMONARY / CRITICAL CARE MEDICINE DEATH SUMMARY   Name: Manuel [name] MRN: [id number] DOB: [birth year]    ADMISSION DATE:  10/13/2016 Date of death: 10-15-16  Final cause of death: Ventricular tachycardia, cardiac arrest  Secondary causes of death: ST elevation myocardial infarction Coronary artery disease Ventricular tachycardia Cardiogenic shock Leukocytosis Acute renal insufficiency History of hypertension Atrial fibrillation Cerebrovascular disease History of GI bleeding Peripheral neuropathy Hyperglycemia  Brief Course:   Manuel [name] is a highly functional and independent 59M who lives at home with his wife with PMH significant for CAD, peripheral neuropathy, prior GI bleeding, HTN, prior stroke (2015) and chronic atrial fibrillation who presented to urgent care Oct 14, 2023 with complaints of epigastric and substernal chest discomfort that radiated to both sides of his neck associated with diaphoresis and chills.  He initially thought he was just having bad reflux with retrosternal burning and some mild stomach upset. He then presented for evaluation. From urgent care he was sent to [facility] ED October 14, 2023. Initial workup revealed troponin >35, lactate 2.63, WBC 15.9, Cr 2.6, BUN 45, INR 1.29 and glucose 150. EKG showed ST elevation in the inferior leads. Cardiology was consulted, but did not think he was a good candidate for catheterization given duration of symptoms, renal function and documented allergy to IVP contrast dye. He was given ASA, heparinized and sent here for further care. He was noted to be hypotensive and was started on peripheral levophed. Blood cultures were also drawn and he was started on Rocephin which was subsequently stopped as suspicion for sepsis was low. Norepi was weaned to off 12/4.  He developed tachyarrhythmia 12/4 pm that prompted lidocaine gtt and underwent L heart cath that revealed significant 3 vessel disease.  On afternoon Oct 16, 2023 he had recurrent VT  with arrest, and he was found unconscious on the bathroom floor. Patient was cardioverted back into normal sinus rhythm quickly and pulse was regained. Patient then briefly became conscious. Amiodarone IV push was administered. After a short period of time the patient went into pulseless electrical activity and lost consciousness again. CPR was initiated and epinephrine 1 mg IV was administered. Resuscitation was continued. Anesthesia performed emergent endotracheal intubation. Despite continued aggressive resuscitation, bolus bicarbonate, & bolus epinephrine the patient remained in pulseless electrical activity. He failed to regain sustainable pulse during our entire resuscitative efforts. He expired 10/15/2016.   Baltazar Apo, MD, PhD 10/04/2016, 2:26 AM [place] [facility] [phone] or if no answer [phone]

## 2016-10-25 NOTE — Progress Notes (Signed)
Patient Name: Manuel [name] Date of Encounter: 10/23/2016  Active Problems:   Cardiogenic shock (HCC)   Acute ST elevation myocardial infarction (STEMI) of inferior wall involving right ventricle (HCC)   AKI (acute kidney injury) ([place])   VT (ventricular tachycardia) ([place])   Cardiomyopathy, ischemic   Length of Stay: 2  SUBJECTIVE  The patient feels better today, up in the chair, no chest pain or SOB.   CURRENT MEDS . carvedilol  3.125 mg Oral BID WC  . clopidogrel  75 mg Oral Daily  . famotidine (PEPCID) IV  20 mg Intravenous Q24H     OBJECTIVE  Vitals:   10/23/16 0530 2016-10-23 0700 23-Oct-2016 0800 10/23/2016 0818  BP:  (!) 144/97 (!) 153/74   Pulse:  80 85   Resp:  16 (!) 29   Temp:    97.2 F (36.2 C)  TempSrc:    Oral  SpO2:  98% 99%   Weight: 203 lb 0.7 oz (92.1 kg)     Height:        Intake/Output Summary (Last 24 hours) at 2016-10-23 0952 Last data filed at 23-Oct-2016 0800  Gross per 24 hour  Intake           1686.5 ml  Output              415 ml  Net           1271.5 ml   Filed Weights   10/01/2016 1620 10/13/2016 1832 10/23/2016 0530  Weight: 192 lb (87.1 kg) 196 lb 3.4 oz (89 kg) 203 lb 0.7 oz (92.1 kg)   PHYSICAL EXAM  General: Pleasant, NAD. Neuro: Alert and oriented X 3. Moves all extremities spontaneously. Psych: Normal affect. HEENT:  Normal  Neck: Supple without bruits or JVD. Lungs:  Resp regular and unlabored, CTA. Heart: RRR no s3, s4, or murmurs. Abdomen: Soft, non-tender, non-distended, BS + x 4.  Extremities: No clubbing, cyanosis or edema. DP/PT/Radials 2+ and equal bilaterally.  Accessory Clinical Findings  CBC  Recent Labs  09/29/2016 1537  10/24/2016 0149 10/23/16 0155  WBC 15.9*  --  18.7* 10.1  NEUTROABS 12.2*  --   --   --   HGB 12.2*  < > 12.1* 11.6*  HCT 37.5*  < > 37.2* 35.0*  MCV 87.8  --  87.3 86.0  PLT 177  --  218 208  < > = values in this interval not displayed. Basic Metabolic Panel  Recent Labs  10/15/2016 0149  10/23/16 0155  NA 134* 134*  K 4.3 5.0  CL 107 108  CO2 17* 13*  GLUCOSE 159* 192*  BUN 43* 51*  CREATININE 2.32* 2.24*  CALCIUM 7.9* 8.0*  MG 2.0 2.1  PHOS 4.5 4.5    Recent Labs  10/21/2016 2057 10/06/2016 0149  TROPONINI 29.27* 23.58*   Radiology/Studies  No results found.  TELE: a-fib with controlled ventricular rate, PVCs and couplets    ASSESSMENT AND PLAN  Manuel [name] is a 83 y.o. male with a history significant for HTN, AF, and documented CAD who presented with several days of fatigue, GI symptoms, and subsequent left shoulder and chest pain with ECG concerning for inferior STEMI.  The initial presentation was discussed with the on-call interventionalist and cardiac catheterization was deferred given the duration of symptoms and complicated clinical picture.  The pt is now CP free and hemodynamically stable on low-dose norepinephrine gtt.  Agree with plan for continued conservative medical management with ASA and  systemic anticoagulation while deferring invasive approach.  1. Inferior STEMI; cardiac cath deferred originally sec to CKD, however the patient developed VT yesterday and was taken to cath that showed   Prox RCA lesion, 100 %stenosed.  Mid Cx lesion, 90 %stenosed.  Ost LM lesion, 80 %stenosed.  CT surgery consult was recommended. I have had a long discussion with the patient and his wife, they live independently with their 50 year old granddaughter, they enjoy life, Mr Shim attends gym several times a week. He has no dementia. If he would be considered for a surgery he would be interested.   2. Hypotension - resolved, off levophed, we will start carvedilol 3.125 mg po BID.   3. Atrial Fibrillation; rate-controlled - hold home Rivaroxaban given heparin gtt as per above  4. AKI; baseline creatine approximately 1.2 and now 2.6-->2.2 improving.   5. VT - yesterday - ischemia driven - resolved on iv lidocaine, I will discontinue and start  carvedilol.   Signed, Ena Dawley MD, [facility] Oct 08, 2016

## 2016-10-25 NOTE — Progress Notes (Signed)
   10/19/2016 1700  Clinical Encounter Type  Visited With Patient and family together  Visit Type Code  Spiritual Encounters  Spiritual Needs Emotional;Grief support  Code, no pulse, pt. Collapsed, resulted into death.  Chaplain was present with family. Contacted family pastor as directed by Spouse.  Chaplain rendered a ministry of presence, prayed with family members, emotional and grief support. Advised family to contact pastoral services or placement specialist if they had any further questions.   [employer] [phone]

## 2016-10-25 NOTE — Progress Notes (Signed)
PULMONARY / CRITICAL CARE MEDICINE   Name: Manuel [name] MRN: [id number] DOB: [birth year]    ADMISSION DATE:  10/12/2016 CONSULTATION DATE:   REFERRING MD:  EDP  CHIEF COMPLAINT:  Chest/neck pain  Brief:   Manuel [name] is a highly functional and independent 83M who lives at home with his wife with PMH significant for CAD, peripheral neuropathy, prior GI bleeding, HTN, prior stroke (2015) and chronic atrial fibrillation who presented to urgent care 12/3 with complaints of epigastric and substernal chest discomfort that radiated to both sides of his neck associated with diaphoresis and chills. Symptom onset was approximately 36-48h ago. He initially thought he was just having bad reflux with retrosternal burning and some mild stomach upset. The pain abated and he went about his business, but had recurrent pain last night and took something for indigestion. He also took some tylenol. He gets constipated with tylenol, so he also took some stool softeners. His pain did not improve and he got very cold. He laid down and awoke with pain radiating to both sides of his neck. He then presented for evaluation. From urgent care he was sent to the [facility] ED. Initial workup revealed troponin >35, lactate 2.63, WBC 15.9, Cr 2.6, BUN 45, INR 1.29 and glucose 150. EKG showed ST elevation in the inferior leads. Cardiology was consulted, but did not think he was a good candidate for catheterization given duration of symptoms, renal function and documented allergy to IVP contrast dye. He was given ASA, heparinized and sent here for further care. He was noted to be hypotensive and was started on peripheral levophed. Blood cultures were also drawn and he was started on Rocephin.  He denies any associated infectious symptoms - no fever / cough /sputum / nausea / vomiting / diarrhea / urinary symptoms. No orthopnea or PND. No new dyspnea on exertion. The swelling in his lower extremities has been a little worse  lately, but he attributes this to his neuropathy and poor circulation.    Allergies  Allergen Reactions  . Adhesive [Tape]     Plastic tape= tears skin  . Ivp Dye [Iodinated Diagnostic Agents] Other (See Comments)    Doesn't remember reaction but it was severe  . Penicillins Hives and Itching  . Rofecoxib Other (See Comments)     elevated BP (Vioxx)  . Sulfamethoxazole Nausea And Vomiting  . Sulfonamide Derivatives Hives and Itching  . Codeine Other (See Comments) and Hives    Pass out    No current facility-administered medications on file prior to encounter.    Current Outpatient Prescriptions on File Prior to Encounter  Medication Sig  . acetaminophen (TYLENOL) 650 MG CR tablet Take 650 mg by mouth every 8 (eight) hours as needed for pain (give with docusate for stool softner).  . Cholecalciferol (VITAMIN D) 2000 UNITS tablet Take 2,000 Units by mouth daily.  . Coenzyme Q10 (COQ10) 200 MG CAPS Take 200 mg by mouth daily.  . diphenhydrAMINE (SOMINEX) 25 MG tablet Take 50 mg by mouth at bedtime. Reported on 04/20/2016  . furosemide (LASIX) 20 MG tablet Take 1 tablet (20 mg total) by mouth daily. Take one tablet (20 mg) by mouth once every other day (Patient taking differently: Take 20 mg by mouth every other day. Take one tablet (20 mg) by mouth once every other day)  . lisinopril (PRINIVIL,ZESTRIL) 20 MG tablet Take 1 tablet (20 mg total) by mouth daily.  . Multiple Vitamin (MULTIVITAMIN WITH MINERALS) TABS tablet Take 1  tablet by mouth daily. NutraLite  . omeprazole (PRILOSEC) 40 MG capsule Take 40 mg by mouth every morning. Reported on 12/24/2015  . vitamin C (ASCORBIC ACID) 500 MG tablet Take 250 mg by mouth 2 (two) times daily.   Alveda Reasons 20 MG TABS tablet take 1 tablet by mouth once daily with SUPPER  . HYDROcodone-acetaminophen (NORCO/VICODIN) 5-325 MG per tablet Take 1 tablet by mouth every 4 (four) hours as needed for moderate pain. (Patient not taking: Reported on 10/11/2016)   . ondansetron (ZOFRAN ODT) 8 MG disintegrating tablet Take 1 tablet (8 mg total) by mouth every 8 (eight) hours as needed for nausea or vomiting. (Patient not taking: Reported on 09/27/2016)    SUBJECTIVE:  Elderly male, no acute distress. Underwent cath last night. Sitting in bed.   VITAL SIGNS: BP (!) 153/74   Pulse 85   Temp 97.2 F (36.2 C) (Oral)   Resp (!) 29   Ht 6\' 2"  (1.88 m)   Wt 92.1 kg (203 lb 0.7 oz)   SpO2 99%   BMI 26.07 kg/m   HEMODYNAMICS:    VENTILATOR SETTINGS:    INTAKE / OUTPUT: I/O last 3 completed shifts: In: 2325.3 [P.O.:680; I.V.:1095.3; IV Piggyback:550] Out: 640 [Urine:640]  PHYSICAL EXAMINATION: Physical Exam: Temp:  [97.2 F (36.2 C)-98.5 F (36.9 C)] 97.2 F (36.2 C) (12/05 0818) Pulse Rate:  [67-161] 85 (12/05 0800) Resp:  [6-33] 29 (12/05 0800) BP: (81-153)/(43-97) 153/74 (12/05 0800) SpO2:  [80 %-99 %] 99 % (12/05 0800) Weight:  [92.1 kg (203 lb 0.7 oz)] 92.1 kg (203 lb 0.7 oz) (12/05 0530)  General Well nourished, well developed, no apparent distress  HEENT Normocephalic   Pulmonary Clear to auscultation bilaterally, unlabored on nasal cannula   Cardiovascular Normal rate, irregularly irregular rhythm. S1, S2. No m/r/g.   Abdomen Soft, non-tender, non-distended, active bowel sounds,   Musculoskeletal No gross abnormalities.  Lymphatics No cervical, supraclavicular or axillary adenopathy.   Neurologic Alert, oriented, follow commands   Skin/Integuement Warm, dry, intact      LABS:  BMET  Recent Labs Lab 10/22/2016 1537 10/08/2016 1549 10/02/2016 0149 10/10/16 0155  NA 136 139 134* 134*  K 4.4 4.4 4.3 5.0  CL 105 107 107 108  CO2 21*  --  17* 13*  BUN 45* 40* 43* 51*  CREATININE 2.60* 2.20* 2.32* 2.24*  GLUCOSE 154* 149* 159* 192*    Electrolytes  Recent Labs Lab 09/25/2016 1537 10/16/2016 0149 10-10-16 0155  CALCIUM 8.8* 7.9* 8.0*  MG  --  2.0 2.1  PHOS  --  4.5 4.5    CBC  Recent Labs Lab 10/09/2016 1537  10/23/2016 1549 10/08/2016 0149 10-10-16 0155  WBC 15.9*  --  18.7* 10.1  HGB 12.2* 12.9* 12.1* 11.6*  HCT 37.5* 38.0* 37.2* 35.0*  PLT 177  --  218 208    Coag's  Recent Labs Lab 10/22/2016 1535  APTT 32  INR 1.29    Sepsis Markers  Recent Labs Lab 10/09/2016 1549 10/09/2016 2057  LATICACIDVEN 2.63* 2.1*    ABG No results for input(s): PHART, PCO2ART, PO2ART in the last 168 hours.  Liver Enzymes No results for input(s): AST, ALT, ALKPHOS, BILITOT, ALBUMIN in the last 168 hours.  Cardiac Enzymes  Recent Labs Lab 10/05/2016 2057 10/07/2016 0149  TROPONINI 29.27* 23.58*    Glucose No results for input(s): GLUCAP in the last 168 hours.  Imaging No results found.   STUDIES:  12/4 Echo >> EF 35-40%  CULTURES: 12/3 Blood x 2 >>>Neg   ANTIBIOTICS: Rocephin 12/3 > 12/5  SIGNIFICANT EVENTS: 12/3 > Admit Inferior STEMI  12/4 > Cath   LINES/TUBES: PIV  DISCUSSION: Manuel [name] is a 40M admitted with STEMI and cardiogenic shock requiring pressors. He is a poor candidate for PCI secondary to multiple factors (renal function, age, contrast allergy, duration of symptoms). With his elevated WBC, there may be some concern for sepsis as a confounding etiology, but this seems unlikely based on his clinical history. Nevertheless, will follow blood cultures, obtain UA/cx and continue rocephin for now. He needs a formal TTE and we will await formal cardiology recommendations. Continue heparin. Titrate levo to MAP 65.   ASSESSMENT / PLAN:  CARDIOVASCULAR A:  STEMI - inferior  Shock, presumed cardiogenic Chronic atrial fibrillation P:  Cardiology following  Lidocaine gtt per cardiology   RENAL A:   Acute kidney injury - Cr 2.6 from baseline ~ 1.2 - likely secondary to shock state Lactic acidosis - improved  P:   Monitor UOP  INFECTIOUS A:   Concern for sepsis, although no clear source P:   D/C rocephin  PULMONARY A: No acute issues P:   Supplemental O2 if  needed   GASTROINTESTINAL A:   Hx of GERD and constipation P:   Famotidine  Bowel regimen  HEMATOLOGIC A:   Elevated WBC - likely reactive P:  Trend CBC  ENDOCRINE A:   Hyperglycemia - likely stress response  P:   CBGs and SSI if needed  NEUROLOGIC A:   No acute issues Peripheral neuropathy Hx of stroke P:   RASS goal: 0    FAMILY  - Updates: Discussion with patient at bedside. He has not previously thought about or discussed code status and would like to remain full code until he can talk about it with his family. Patient will remain FULL CODE for now.  12/5 spoke with family about code status. They will continue to speak together about becoming a DNR.  - Inter-disciplinary family meet or Palliative Care meeting due by:  day [place], AG-ACNP [place] [facility]  Pgr: [phone]  PCCM Pgr: [phone]  Attending Note:  I have examined patient, reviewed labs, studies and notes. I have discussed the case with Beaulah Corin, and I agree with the data and plans as amended above.  Manuel [name] admitted with STEMI and cardiogenic shock. norepi weaned to off. He developed tachyarrhythmia 12/4 pm that prompted lidocaine gtt and L heart cath that revealed significant 3 vessel disease. Unclear whether he would / should be a candidate for CABG given his age. He is now pain free, no further VT reported. We will ask cardiology to assume his care now that he is hemodynamically stable. Please call if we can assist in any way.   Baltazar Apo, MD, PhD 13-Oct-2016, 11:05 AM [place] [facility] [phone] or if no answer [phone]
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
## 2016-10-25 NOTE — Progress Notes (Addendum)
ANTICOAGULATION CONSULT NOTE - Follow Up Consult  Pharmacy Consult for heparin Indication: atrial fibrillation  Allergies  Allergen Reactions  . Adhesive [Tape]     Plastic tape= tears skin  . Ivp Dye [Iodinated Diagnostic Agents] Other (See Comments)    Doesn't remember reaction but it was severe  . Penicillins Hives and Itching  . Rofecoxib Other (See Comments)     elevated BP (Vioxx)  . Sulfamethoxazole Nausea And Vomiting  . Sulfonamide Derivatives Hives and Itching  . Codeine Other (See Comments) and Hives    Pass out    Patient Measurements: Height: 6\' 2"  (188 cm) Weight: 203 lb 0.7 oz (92.1 kg) IBW/kg (Calculated) : 82.2 Heparin Dosing Weight: 92kg  Vital Signs: Temp: 97.2 F (36.2 C) (12/05 0818) Temp Source: Oral (12/05 0818) BP: 125/93 (12/05 1000) Pulse Rate: 84 (12/05 1000)  Labs:  Recent Labs  10/21/2016 1535  10/11/2016 1537 09/24/2016 1549 09/30/2016 2057 10/01/2016 0149 10/21/2016 1049 10-21-16 0155  HGB  --   < > 12.2* 12.9*  --  12.1*  --  11.6*  HCT  --   < > 37.5* 38.0*  --  37.2*  --  35.0*  PLT  --   --  177  --   --  218  --  208  APTT 32  --   --   --   --   --   --   --   LABPROT 16.2*  --   --   --   --   --   --   --   INR 1.29  --   --   --   --   --   --   --   HEPARINUNFRC  --   --   --   --   --  <0.10* 0.26*  --   CREATININE  --   < > 2.60* 2.20*  --  2.32*  --  2.24*  TROPONINI  --   --   --   --  29.27* 23.58*  --   --   < > = values in this interval not displayed.  Estimated Creatinine Clearance (by C-G formula based on SCr of 2.24 mg/dL (H)) Male: 19.7 mL/min Male: 24.5 mL/min   Assessment: 89 YOM very independent and functional at home who presented with fatigue, nausea, and neck pain. History of AFib and was previously on Xarelto, but has not taken since October due to cost issues. He was taken to cath lab and he has stenosis in Prox RCA, mid Cx and Ost LM- CT surgery consult placed.  To restart heparin now for Afib and CABG  workup. Was on heparin gtt at 1350 units/hr before cath, level was slightly low and rate was increased but a level was never checked. Hgb 11.6, plts 208- no bleeding noted.  Goal of Therapy:  Heparin level 0.3-0.7 units/ml Monitor platelets by anticoagulation protocol: Yes   Plan:  -restart heparin with 4000 unit bolus and then start infusion at 1350 units/hr -heparin level in 8h -daily heparin level and CBC -follow surgical and long term anticoagulation plans  Talayia Hjort D. Takoda Siedlecki, PharmD, BCPS Clinical Pharmacist Pager: [phone] 21-Oct-2016 10:52 AM

## 2016-10-25 NOTE — Progress Notes (Signed)
[place] Progress Note Patient Name: JAKEL CROUCH DOB: 1922-05-21 MRN: [id number]   Date of Service  10/26/16  HPI/Events of Note  Oliguria Left heart cath today  eICU Interventions  Restart IV fluids     Intervention Category Intermediate Interventions: Oliguria - evaluation and management  Simonne Maffucci 2016/10/26, 12:05 AM

## 2016-10-25 NOTE — Progress Notes (Signed)
Dr. Lake Bells notified of low UOP, orders received

## 2016-10-25 NOTE — Progress Notes (Signed)
Patient and wife have requested to speak to Dr. Gwenlyn Found. Have paged physician and waiting for reply. Have spoken to Dr. Meda Coffee who has already seen patient and is aware of the request.

## 2016-10-25 DEATH — deceased

## 2016-12-29 ENCOUNTER — Encounter (HOSPITAL_COMMUNITY): Payer: Medicare Other

## 2016-12-29 ENCOUNTER — Ambulatory Visit: Payer: Medicare Other | Admitting: Family
